# Patient Record
Sex: Female | Born: 1945
Health system: Southern US, Community
[De-identification: ages and names within clinical notes are randomized; demographics above are authoritative.]

## PROBLEM LIST (undated history)

## (undated) DIAGNOSIS — J302 Other seasonal allergic rhinitis: Secondary | ICD-10-CM

## (undated) DIAGNOSIS — F419 Anxiety disorder, unspecified: Secondary | ICD-10-CM

## (undated) DIAGNOSIS — Z8719 Personal history of other diseases of the digestive system: Secondary | ICD-10-CM

## (undated) DIAGNOSIS — N135 Crossing vessel and stricture of ureter without hydronephrosis: Secondary | ICD-10-CM

## (undated) DIAGNOSIS — N393 Stress incontinence (female) (male): Secondary | ICD-10-CM

## (undated) DIAGNOSIS — K219 Gastro-esophageal reflux disease without esophagitis: Secondary | ICD-10-CM

## (undated) DIAGNOSIS — Z973 Presence of spectacles and contact lenses: Secondary | ICD-10-CM

## (undated) DIAGNOSIS — F32A Depression, unspecified: Secondary | ICD-10-CM

## (undated) DIAGNOSIS — F329 Major depressive disorder, single episode, unspecified: Secondary | ICD-10-CM

## (undated) DIAGNOSIS — Z8669 Personal history of other diseases of the nervous system and sense organs: Secondary | ICD-10-CM

## (undated) DIAGNOSIS — K5909 Other constipation: Secondary | ICD-10-CM

## (undated) HISTORY — DX: Major depressive disorder, single episode, unspecified: F32.9

## (undated) HISTORY — DX: Gastro-esophageal reflux disease without esophagitis: K21.9

## (undated) HISTORY — DX: Depression, unspecified: F32.A

## (undated) HISTORY — DX: Personal history of other diseases of the nervous system and sense organs: Z86.69

## (undated) HISTORY — DX: Anxiety disorder, unspecified: F41.9

---

## 1969-10-23 HISTORY — PX: TUBAL LIGATION: SHX77

## 1974-10-23 HISTORY — PX: VAGINAL HYSTERECTOMY: SUR661

## 1978-10-23 HISTORY — PX: BREAST EXCISIONAL BIOPSY: SUR124

## 1978-10-23 HISTORY — PX: BREAST BIOPSY: SHX20

## 1998-11-22 ENCOUNTER — Other Ambulatory Visit: Admission: RE | Admit: 1998-11-22 | Discharge: 1998-11-22 | Payer: Self-pay | Admitting: Family Medicine

## 2000-09-12 ENCOUNTER — Ambulatory Visit (HOSPITAL_COMMUNITY): Admission: RE | Admit: 2000-09-12 | Discharge: 2000-09-12 | Payer: Self-pay | Admitting: Gastroenterology

## 2000-09-12 ENCOUNTER — Encounter: Payer: Self-pay | Admitting: Gastroenterology

## 2001-05-14 ENCOUNTER — Other Ambulatory Visit: Admission: RE | Admit: 2001-05-14 | Discharge: 2001-05-14 | Payer: Self-pay | Admitting: Gynecology

## 2002-01-06 ENCOUNTER — Encounter: Admission: RE | Admit: 2002-01-06 | Discharge: 2002-01-06 | Payer: Self-pay | Admitting: Family Medicine

## 2002-01-06 ENCOUNTER — Encounter: Payer: Self-pay | Admitting: Family Medicine

## 2002-03-06 ENCOUNTER — Encounter: Payer: Self-pay | Admitting: Emergency Medicine

## 2002-03-06 ENCOUNTER — Inpatient Hospital Stay (HOSPITAL_COMMUNITY): Admission: EM | Admit: 2002-03-06 | Discharge: 2002-03-07 | Payer: Self-pay | Admitting: Emergency Medicine

## 2002-04-01 ENCOUNTER — Ambulatory Visit (HOSPITAL_COMMUNITY): Admission: RE | Admit: 2002-04-01 | Discharge: 2002-04-01 | Payer: Self-pay | Admitting: Gastroenterology

## 2002-04-01 ENCOUNTER — Encounter: Payer: Self-pay | Admitting: Gastroenterology

## 2002-04-02 ENCOUNTER — Ambulatory Visit (HOSPITAL_COMMUNITY): Admission: RE | Admit: 2002-04-02 | Discharge: 2002-04-02 | Payer: Self-pay | Admitting: Gastroenterology

## 2002-04-02 HISTORY — PX: ESOPHAGOGASTRODUODENOSCOPY: SHX1529

## 2002-05-26 ENCOUNTER — Other Ambulatory Visit: Admission: RE | Admit: 2002-05-26 | Discharge: 2002-05-26 | Payer: Self-pay | Admitting: Gynecology

## 2003-05-02 ENCOUNTER — Encounter: Payer: Self-pay | Admitting: Emergency Medicine

## 2003-05-02 ENCOUNTER — Emergency Department (HOSPITAL_COMMUNITY): Admission: EM | Admit: 2003-05-02 | Discharge: 2003-05-02 | Payer: Self-pay | Admitting: Emergency Medicine

## 2003-06-22 ENCOUNTER — Other Ambulatory Visit: Admission: RE | Admit: 2003-06-22 | Discharge: 2003-06-22 | Payer: Self-pay | Admitting: Gynecology

## 2003-12-04 ENCOUNTER — Encounter: Admission: RE | Admit: 2003-12-04 | Discharge: 2003-12-04 | Payer: Self-pay | Admitting: Family Medicine

## 2004-03-10 ENCOUNTER — Inpatient Hospital Stay (HOSPITAL_COMMUNITY): Admission: RE | Admit: 2004-03-10 | Discharge: 2004-03-11 | Payer: Self-pay | Admitting: Neurosurgery

## 2004-03-10 HISTORY — PX: ANTERIOR CERVICAL DECOMP/DISCECTOMY FUSION: SHX1161

## 2004-05-04 ENCOUNTER — Encounter: Admission: RE | Admit: 2004-05-04 | Discharge: 2004-05-04 | Payer: Self-pay | Admitting: Neurosurgery

## 2004-06-22 ENCOUNTER — Other Ambulatory Visit: Admission: RE | Admit: 2004-06-22 | Discharge: 2004-06-22 | Payer: Self-pay | Admitting: Gynecology

## 2004-09-27 ENCOUNTER — Ambulatory Visit: Payer: Self-pay | Admitting: Family Medicine

## 2005-05-03 ENCOUNTER — Ambulatory Visit: Payer: Self-pay | Admitting: Family Medicine

## 2005-06-22 ENCOUNTER — Other Ambulatory Visit: Admission: RE | Admit: 2005-06-22 | Discharge: 2005-06-22 | Payer: Self-pay | Admitting: Gynecology

## 2005-07-14 ENCOUNTER — Encounter: Admission: RE | Admit: 2005-07-14 | Discharge: 2005-07-14 | Payer: Self-pay | Admitting: Family Medicine

## 2005-10-21 ENCOUNTER — Ambulatory Visit: Payer: Self-pay | Admitting: Family Medicine

## 2005-11-22 ENCOUNTER — Ambulatory Visit: Payer: Self-pay | Admitting: Family Medicine

## 2005-12-18 ENCOUNTER — Ambulatory Visit: Payer: Self-pay | Admitting: Family Medicine

## 2006-01-11 ENCOUNTER — Ambulatory Visit: Payer: Self-pay | Admitting: Family Medicine

## 2006-02-06 ENCOUNTER — Ambulatory Visit: Payer: Self-pay | Admitting: Family Medicine

## 2006-02-16 ENCOUNTER — Ambulatory Visit: Payer: Self-pay | Admitting: Cardiology

## 2006-06-10 ENCOUNTER — Ambulatory Visit (HOSPITAL_COMMUNITY): Admission: RE | Admit: 2006-06-10 | Discharge: 2006-06-10 | Payer: Self-pay | Admitting: Emergency Medicine

## 2006-06-26 ENCOUNTER — Other Ambulatory Visit: Admission: RE | Admit: 2006-06-26 | Discharge: 2006-06-26 | Payer: Self-pay | Admitting: Gynecology

## 2006-06-26 ENCOUNTER — Ambulatory Visit: Payer: Self-pay | Admitting: Family Medicine

## 2006-07-09 ENCOUNTER — Ambulatory Visit: Payer: Self-pay | Admitting: Internal Medicine

## 2006-08-28 ENCOUNTER — Encounter (INDEPENDENT_AMBULATORY_CARE_PROVIDER_SITE_OTHER): Payer: Self-pay | Admitting: *Deleted

## 2006-08-28 ENCOUNTER — Ambulatory Visit: Admission: RE | Admit: 2006-08-28 | Discharge: 2006-08-28 | Payer: Self-pay | Admitting: Internal Medicine

## 2006-08-28 ENCOUNTER — Ambulatory Visit: Payer: Self-pay | Admitting: Internal Medicine

## 2007-01-10 ENCOUNTER — Ambulatory Visit: Payer: Self-pay | Admitting: Family Medicine

## 2007-01-10 LAB — CONVERTED CEMR LAB
ALT: 15 units/L (ref 0–40)
AST: 29 units/L (ref 0–37)
Albumin: 4.2 g/dL (ref 3.5–5.2)
Alkaline Phosphatase: 39 units/L (ref 39–117)
BUN: 10 mg/dL (ref 6–23)
Basophils Absolute: 0.1 10*3/uL (ref 0.0–0.1)
Basophils Relative: 1.3 % — ABNORMAL HIGH (ref 0.0–1.0)
Bilirubin, Direct: 0.1 mg/dL (ref 0.0–0.3)
CO2: 29 meq/L (ref 19–32)
Calcium: 9.4 mg/dL (ref 8.4–10.5)
Chloride: 106 meq/L (ref 96–112)
Cholesterol: 214 mg/dL (ref 0–200)
Creatinine, Ser: 0.8 mg/dL (ref 0.4–1.2)
Direct LDL: 147.7 mg/dL
Eosinophils Absolute: 0.2 10*3/uL (ref 0.0–0.6)
Eosinophils Relative: 5 % (ref 0.0–5.0)
GFR calc Af Amer: 94 mL/min
GFR calc non Af Amer: 78 mL/min
Glucose, Bld: 84 mg/dL (ref 70–99)
HCT: 38 % (ref 36.0–46.0)
HDL: 61.1 mg/dL (ref >39.0)
Hemoglobin: 13.1 g/dL (ref 12.0–15.0)
Lymphocytes Relative: 34.1 % (ref 12.0–46.0)
MCHC: 34.5 g/dL (ref 30.0–36.0)
MCV: 84.4 fL (ref 78.0–100.0)
Monocytes Absolute: 0.4 10*3/uL (ref 0.2–0.7)
Monocytes Relative: 9.6 % (ref 3.0–11.0)
Neutro Abs: 2.1 10*3/uL (ref 1.4–7.7)
Neutrophils Relative %: 50 % (ref 43.0–77.0)
Platelets: 198 10*3/uL (ref 150–400)
Potassium: 4.7 meq/L (ref 3.5–5.1)
RBC: 4.5 M/uL (ref 3.87–5.11)
RDW: 13.1 % (ref 11.5–14.6)
Sodium: 142 meq/L (ref 135–145)
TSH: 1.62 microintl units/mL (ref 0.35–5.50)
Total Bilirubin: 0.7 mg/dL (ref 0.3–1.2)
Total CHOL/HDL Ratio: 3.5
Total Protein: 6.4 g/dL (ref 6.0–8.3)
Triglycerides: 51 mg/dL (ref 0–149)
VLDL: 10 mg/dL (ref 0–40)
WBC: 4.3 10*3/uL — ABNORMAL LOW (ref 4.5–10.5)

## 2007-01-24 ENCOUNTER — Ambulatory Visit: Payer: Self-pay | Admitting: Family Medicine

## 2007-03-19 ENCOUNTER — Ambulatory Visit: Payer: Self-pay | Admitting: Family Medicine

## 2007-06-07 ENCOUNTER — Encounter: Admission: RE | Admit: 2007-06-07 | Discharge: 2007-06-07 | Payer: Self-pay | Admitting: Family Medicine

## 2007-06-11 ENCOUNTER — Encounter: Admission: RE | Admit: 2007-06-11 | Discharge: 2007-06-11 | Payer: Self-pay | Admitting: Gynecology

## 2007-07-02 ENCOUNTER — Other Ambulatory Visit: Admission: RE | Admit: 2007-07-02 | Discharge: 2007-07-02 | Payer: Self-pay | Admitting: Gynecology

## 2007-07-16 ENCOUNTER — Telehealth: Payer: Self-pay | Admitting: Family Medicine

## 2007-09-13 ENCOUNTER — Encounter: Payer: Self-pay | Admitting: Family Medicine

## 2007-11-06 ENCOUNTER — Ambulatory Visit: Payer: Self-pay | Admitting: Family Medicine

## 2007-11-06 DIAGNOSIS — K219 Gastro-esophageal reflux disease without esophagitis: Secondary | ICD-10-CM | POA: Insufficient documentation

## 2007-11-06 DIAGNOSIS — K644 Residual hemorrhoidal skin tags: Secondary | ICD-10-CM | POA: Insufficient documentation

## 2007-11-06 DIAGNOSIS — F341 Dysthymic disorder: Secondary | ICD-10-CM | POA: Insufficient documentation

## 2007-11-06 DIAGNOSIS — K589 Irritable bowel syndrome without diarrhea: Secondary | ICD-10-CM | POA: Insufficient documentation

## 2007-11-06 DIAGNOSIS — M810 Age-related osteoporosis without current pathological fracture: Secondary | ICD-10-CM | POA: Insufficient documentation

## 2007-11-28 ENCOUNTER — Encounter: Payer: Self-pay | Admitting: Family Medicine

## 2007-11-29 ENCOUNTER — Encounter: Payer: Self-pay | Admitting: Family Medicine

## 2007-12-05 ENCOUNTER — Telehealth: Payer: Self-pay | Admitting: Family Medicine

## 2008-01-28 ENCOUNTER — Telehealth: Payer: Self-pay | Admitting: Family Medicine

## 2008-02-12 LAB — HM PAP SMEAR: HM Pap smear: NEGATIVE

## 2008-02-19 ENCOUNTER — Telehealth: Payer: Self-pay | Admitting: Family Medicine

## 2008-04-06 ENCOUNTER — Encounter: Payer: Self-pay | Admitting: Family Medicine

## 2008-04-08 ENCOUNTER — Encounter: Payer: Self-pay | Admitting: Family Medicine

## 2008-05-23 LAB — HM MAMMOGRAPHY: HM Mammogram: NORMAL

## 2008-07-01 ENCOUNTER — Other Ambulatory Visit: Admission: RE | Admit: 2008-07-01 | Discharge: 2008-07-01 | Payer: Self-pay | Admitting: Gynecology

## 2008-07-15 ENCOUNTER — Encounter: Admission: RE | Admit: 2008-07-15 | Discharge: 2008-07-15 | Payer: Self-pay | Admitting: Gynecology

## 2008-07-21 ENCOUNTER — Ambulatory Visit: Payer: Self-pay | Admitting: Family Medicine

## 2008-07-29 ENCOUNTER — Telehealth: Payer: Self-pay | Admitting: Family Medicine

## 2008-11-23 ENCOUNTER — Encounter: Admission: RE | Admit: 2008-11-23 | Discharge: 2008-11-23 | Payer: Self-pay | Admitting: Neurosurgery

## 2008-11-25 ENCOUNTER — Encounter: Payer: Self-pay | Admitting: Family Medicine

## 2008-12-17 ENCOUNTER — Telehealth: Payer: Self-pay | Admitting: Family Medicine

## 2009-01-13 ENCOUNTER — Ambulatory Visit: Payer: Self-pay | Admitting: Family Medicine

## 2009-01-14 ENCOUNTER — Encounter: Payer: Self-pay | Admitting: Family Medicine

## 2009-01-19 ENCOUNTER — Ambulatory Visit: Payer: Self-pay | Admitting: Family Medicine

## 2009-01-19 DIAGNOSIS — G43009 Migraine without aura, not intractable, without status migrainosus: Secondary | ICD-10-CM | POA: Insufficient documentation

## 2009-01-19 LAB — CONVERTED CEMR LAB
ALT: 20 units/L (ref 0–35)
AST: 31 units/L (ref 0–37)
Albumin: 4.1 g/dL (ref 3.5–5.2)
Alkaline Phosphatase: 41 units/L (ref 39–117)
BUN: 14 mg/dL (ref 6–23)
Basophils Absolute: 0 10*3/uL (ref 0.0–0.1)
Basophils Relative: 0.9 % (ref 0.0–3.0)
Bilirubin Urine: NEGATIVE
Bilirubin, Direct: 0.1 mg/dL (ref 0.0–0.3)
CO2: 31 meq/L (ref 19–32)
Calcium: 9.1 mg/dL (ref 8.4–10.5)
Chloride: 107 meq/L (ref 96–112)
Cholesterol: 200 mg/dL (ref 0–200)
Creatinine, Ser: 0.8 mg/dL (ref 0.4–1.2)
Eosinophils Absolute: 0.2 10*3/uL (ref 0.0–0.7)
Eosinophils Relative: 5.3 % — ABNORMAL HIGH (ref 0.0–5.0)
GFR calc non Af Amer: 76.92 mL/min (ref >60)
Glucose, Bld: 91 mg/dL (ref 70–99)
HCT: 40.3 % (ref 36.0–46.0)
HDL: 61 mg/dL (ref >39.00)
Hemoglobin, Urine: NEGATIVE
Hemoglobin: 13.9 g/dL (ref 12.0–15.0)
Ketones, ur: NEGATIVE mg/dL
LDL Cholesterol: 129 mg/dL — ABNORMAL HIGH (ref 0–99)
Leukocytes, UA: NEGATIVE
Lymphocytes Relative: 32 % (ref 12.0–46.0)
Lymphs Abs: 1.4 10*3/uL (ref 0.7–4.0)
MCHC: 34.4 g/dL (ref 30.0–36.0)
MCV: 86.5 fL (ref 78.0–100.0)
Monocytes Absolute: 0.5 10*3/uL (ref 0.1–1.0)
Monocytes Relative: 10.2 % (ref 3.0–12.0)
Neutro Abs: 2.4 10*3/uL (ref 1.4–7.7)
Neutrophils Relative %: 51.6 % (ref 43.0–77.0)
Nitrite: NEGATIVE
Platelets: 182 10*3/uL (ref 150.0–400.0)
Potassium: 4.2 meq/L (ref 3.5–5.1)
RBC: 4.66 M/uL (ref 3.87–5.11)
RDW: 12.7 % (ref 11.5–14.6)
Sodium: 143 meq/L (ref 135–145)
Specific Gravity, Urine: 1.02 (ref 1.000–1.030)
TSH: 1.53 microintl units/mL (ref 0.35–5.50)
Total Bilirubin: 0.8 mg/dL (ref 0.3–1.2)
Total CHOL/HDL Ratio: 3
Total Protein, Urine: NEGATIVE mg/dL
Total Protein: 6.5 g/dL (ref 6.0–8.3)
Triglycerides: 51 mg/dL (ref 0.0–149.0)
Urine Glucose: NEGATIVE mg/dL
Urobilinogen, UA: 0.2 (ref 0.0–1.0)
VLDL: 10.2 mg/dL (ref 0.0–40.0)
WBC: 4.5 10*3/uL (ref 4.5–10.5)
pH: 6 (ref 5.0–8.0)

## 2009-02-02 ENCOUNTER — Telehealth: Payer: Self-pay | Admitting: Family Medicine

## 2009-02-16 ENCOUNTER — Ambulatory Visit: Payer: Self-pay | Admitting: Family Medicine

## 2009-03-08 ENCOUNTER — Encounter: Payer: Self-pay | Admitting: Family Medicine

## 2009-03-08 HISTORY — PX: COLONOSCOPY WITH PROPOFOL: SHX5780

## 2009-05-20 ENCOUNTER — Telehealth (INDEPENDENT_AMBULATORY_CARE_PROVIDER_SITE_OTHER): Payer: Self-pay | Admitting: *Deleted

## 2009-06-15 ENCOUNTER — Telehealth: Payer: Self-pay | Admitting: Family Medicine

## 2009-07-14 ENCOUNTER — Telehealth: Payer: Self-pay | Admitting: Family Medicine

## 2009-09-07 ENCOUNTER — Telehealth: Payer: Self-pay | Admitting: Family Medicine

## 2009-10-12 ENCOUNTER — Ambulatory Visit: Payer: Self-pay | Admitting: Family Medicine

## 2009-10-26 ENCOUNTER — Encounter: Payer: Self-pay | Admitting: Family Medicine

## 2009-11-13 LAB — HM COLONOSCOPY: HM Colonoscopy: NORMAL

## 2010-01-18 ENCOUNTER — Ambulatory Visit: Payer: Self-pay | Admitting: Family Medicine

## 2010-01-25 ENCOUNTER — Ambulatory Visit: Payer: Self-pay | Admitting: Family Medicine

## 2010-01-25 LAB — CONVERTED CEMR LAB
ALT: 18 units/L (ref 0–35)
AST: 28 units/L (ref 0–37)
Albumin: 4.3 g/dL (ref 3.5–5.2)
Alkaline Phosphatase: 35 units/L — ABNORMAL LOW (ref 39–117)
BUN: 13 mg/dL (ref 6–23)
Basophils Absolute: 0.1 10*3/uL (ref 0.0–0.1)
Basophils Relative: 1.2 % (ref 0.0–3.0)
Bilirubin Urine: NEGATIVE
Bilirubin, Direct: 0 mg/dL (ref 0.0–0.3)
CO2: 33 meq/L — ABNORMAL HIGH (ref 19–32)
Calcium: 9.4 mg/dL (ref 8.4–10.5)
Chloride: 105 meq/L (ref 96–112)
Cholesterol: 230 mg/dL — ABNORMAL HIGH (ref 0–200)
Creatinine, Ser: 0.8 mg/dL (ref 0.4–1.2)
Direct LDL: 143.2 mg/dL
Eosinophils Absolute: 0.2 10*3/uL (ref 0.0–0.7)
Eosinophils Relative: 5 % (ref 0.0–5.0)
GFR calc non Af Amer: 76.67 mL/min (ref >60)
Glucose, Bld: 89 mg/dL (ref 70–99)
Glucose, Urine, Semiquant: NEGATIVE
HCT: 42.3 % (ref 36.0–46.0)
HDL: 65 mg/dL (ref >39.00)
Hemoglobin: 13.7 g/dL (ref 12.0–15.0)
Ketones, urine, test strip: NEGATIVE
Lymphocytes Relative: 33.8 % (ref 12.0–46.0)
Lymphs Abs: 1.7 10*3/uL (ref 0.7–4.0)
MCHC: 32.4 g/dL (ref 30.0–36.0)
MCV: 88.6 fL (ref 78.0–100.0)
Monocytes Absolute: 0.5 10*3/uL (ref 0.1–1.0)
Monocytes Relative: 10.5 % (ref 3.0–12.0)
Neutro Abs: 2.4 10*3/uL (ref 1.4–7.7)
Neutrophils Relative %: 49.5 % (ref 43.0–77.0)
Nitrite: NEGATIVE
Platelets: 224 10*3/uL (ref 150.0–400.0)
Potassium: 4.2 meq/L (ref 3.5–5.1)
Protein, U semiquant: NEGATIVE
RBC: 4.77 M/uL (ref 3.87–5.11)
RDW: 13.3 % (ref 11.5–14.6)
Sodium: 146 meq/L — ABNORMAL HIGH (ref 135–145)
Specific Gravity, Urine: 1.015
TSH: 1.76 microintl units/mL (ref 0.35–5.50)
Total Bilirubin: 0.3 mg/dL (ref 0.3–1.2)
Total CHOL/HDL Ratio: 4
Total Protein: 6.8 g/dL (ref 6.0–8.3)
Triglycerides: 144 mg/dL (ref 0.0–149.0)
Urobilinogen, UA: 0.2
VLDL: 28.8 mg/dL (ref 0.0–40.0)
WBC Urine, dipstick: NEGATIVE
WBC: 4.9 10*3/uL (ref 4.5–10.5)
pH: 7

## 2010-02-02 LAB — CONVERTED CEMR LAB: Vit D, 25-Hydroxy: 24 ng/mL — ABNORMAL LOW (ref 30–89)

## 2010-02-09 ENCOUNTER — Telehealth: Payer: Self-pay | Admitting: Family Medicine

## 2010-07-27 ENCOUNTER — Telehealth: Payer: Self-pay | Admitting: Family Medicine

## 2010-11-13 ENCOUNTER — Encounter: Payer: Self-pay | Admitting: Family Medicine

## 2010-11-13 ENCOUNTER — Encounter: Payer: Self-pay | Admitting: Neurosurgery

## 2010-11-14 ENCOUNTER — Encounter: Payer: Self-pay | Admitting: Gynecology

## 2010-11-22 NOTE — Progress Notes (Signed)
Summary: RX REFILL  zomig   Phone Note Call from Patient Call back at [phone]   Caller: PT LIVE Call For: STAFFORD Summary of Call: PATIENT NEEDS RX REFILL FOR SUMATRIPTAN 50MG  TABS,  [facility]  [id number], FAX [phone]. Initial call taken by: Celine Ahr,  February 02, 2009 8:36 AM    New/Updated Medications: ZOMIG 5 MG TABS (ZOLMITRIPTAN) 1 by mouth at onset headache repeat in 2 hrs if needed.   Prescriptions: ZOMIG 5 MG TABS (ZOLMITRIPTAN) 1 by mouth at onset headache repeat in 2 hrs if needed.  #9 x 11   Entered by:   Pura Spice, RN   Authorized by:   Judithann Sheen MD   Signed by:   Pura Spice, RN on 02/02/2009   Method used:   Electronically to        [facility]* (retail)       [address]       [place], [place]  [address]       Ph: [phone]       Fax: [phone]   RxID:   [id number]

## 2010-11-22 NOTE — Assessment & Plan Note (Signed)
Summary: congestion//ccm   Vital Signs:  Patient profile:   65 year old female Weight:      130 pounds BMI:     22.05 O2 Sat:      98 % Temp:     98 degrees F Pulse rate:   99 / minute BP sitting:   110 / 72  (left arm)  Vitals Entered By: Pura Spice, RN (October 12, 2009 11:05 AM) CC: chest congestion yellow sputum ears full sinus pressure and pain rt chest area    History of Present Illness: this 65 year old white female comes in today complaining of a two-week history of chest congestion nasal congestion as well as pain over the face over the maxillary sinus areas as well as the sensation of the ears being full., in the beginning and was seen by urticaria and fell the patient of all stomatitis and treat her with Magic mouth wash  She also complains of pain over the rib cage on deep inspiration and movement Gen. mildly making Blood pressure control GERD under control with omeprazole 40 mg q. day Headaches controlled with Comtrex  Allergies (verified): No Known Drug Allergies  Review of Systems  The patient denies anorexia, fever, weight loss, weight gain, vision loss, decreased hearing, hoarseness, chest pain, syncope, dyspnea on exertion, peripheral edema, prolonged cough, headaches, hemoptysis, abdominal pain, melena, hematochezia, severe indigestion/heartburn, hematuria, incontinence, genital sores, muscle weakness, suspicious skin lesions, transient blindness, difficulty walking, depression, unusual weight change, abnormal bleeding, enlarged lymph nodes, angioedema, breast masses, and testicular masses.    Physical Exam  General:  Well-developed,well-nourished,in no acute distress; alert,appropriate and cooperative throughout examination Head:  Normocephalic and atraumatic without obvious abnormalities. No apparent alopecia or balding. Eyes:  No corneal or conjunctival inflammation noted. EOMI. Perrla. Funduscopic exam benign, without hemorrhages, exudates or  papilledema. Vision grossly normal. Ears:  External ear exam shows no significant lesions or deformities.  Otoscopic examination reveals clear canals, tympanic membranes are intact bilaterally without bulging, retraction, inflammation or discharge. Hearing is grossly normal bilaterally. Nose:  considerable nasal congestion with erythematous mucosa and discolored nasal drainage tenderness over the maxillary sinuses bilaterally Mouth:  Oral mucosa and oropharynx without lesions or exudates.  Teeth in good repair. Neck:  No deformities, masses, or tenderness noted. Chest Wall:  tenderness right costochondral joints ribs 2-7 Lungs:  rhonchi bilaterally with rales at both bases, no wheezing Heart:  Normal rate and regular rhythm. S1 and S2 normal without gallop, murmur, click, rub or other extra sounds. Abdomen:  Bowel sounds positive,abdomen soft and non-tender without masses, organomegaly or hernias noted. Extremities:  No clubbing, cyanosis, edema, or deformity noted with normal full range of motion of all joints.     Impression & Recommendations:  Problem # 1:  ACUTE MAXILLARY SINUSITIS (ICD-461.0) Assessment New  Her updated medication list for this problem includes:    Fluticasone Propionate 50 Mcg/act Susp (Fluticasone propionate) .Marland Kitchen... 2 sprays each nostril once daily    Zithromax Z-pak 250 Mg Tabs (Azithromycin) .Marland Kitchen... 2 now then 1 once daily until finished    Mucinex D [phone] Mg Xr12h-tab (Pseudoephedrine-guaifenesin) .Marland Kitchen... 1 bid    Hydrocodone-homatropine 5-1.5 Mg/86ml Syrp (Hydrocodone-homatropine) .Marland Kitchen... 1-2 tsp q4h as needed cough  Orders: Prescription Created Electronically [phone])  Problem # 2:  ACUTE BRONCHITIS (ICD-466.0) Assessment: New  Her updated medication list for this problem includes:    Zithromax Z-pak 250 Mg Tabs (Azithromycin) .Marland Kitchen... 2 now then 1 once daily until finished    Mucinex D [phone]  Mg Xr12h-tab (Pseudoephedrine-guaifenesin) .Marland Kitchen... 1 bid     Hydrocodone-homatropine 5-1.5 Mg/59ml Syrp (Hydrocodone-homatropine) .Marland Kitchen... 1-2 tsp q4h as needed cough  Problem # 3:  COSTOCHONDRITIS (ICD-733.6) Assessment: New  Her updated medication list for this problem includes:    Actonel 150 Mg Tabs (Risedronate sodium) .Marland Kitchen... Take 1 by mouth monthly  Orders: Depo- Medrol 80mg  (J1040) Depo- Medrol 40mg  (J1030) Admin of Therapeutic Inj  intramuscular or subcutaneous ([address])  Problem # 4:  ANXIETY DEPRESSION (ICD-300.4) Assessment: Improved  Problem # 5:  GERD (ICD-530.81) Assessment: Improved  Her updated medication list for this problem includes:    Omeprazole 40 Mg Cpdr (Omeprazole) .Marland Kitchen... 1 by mouth once daily  Complete Medication List: 1)  Citalopram Hydrobromide 40 Mg Tabs (Citalopram hydrobromide) .Marland Kitchen.. 1 by mouth once daily 2)  Analpram Hc  .... Insert and apply bid 3)  Fluticasone Propionate 50 Mcg/act Susp (Fluticasone propionate) .... 2 sprays each nostril once daily 4)  Fexofenadine Hcl 180 Mg Tabs (Fexofenadine hcl) .... Take 1 by mouth once daily 5)  Diazepam 5 Mg Tabs (Diazepam) .Marland Kitchen.. 1 by mouth three times a day as needed stress 6)  Clotrimazole-betamethasone 1-0.05 % Crea (Clotrimazole-betamethasone) .... Use sparingly to rash on hand 7)  Actonel 150 Mg Tabs (Risedronate sodium) .... Take 1 by mouth monthly 8)  Imitrex 100 Mg Tabs (Sumatriptan succinate) .Marland Kitchen.. 1 by mouth at the onset of migraine. may repeat dose in one hour if headache not resolved 9)  Omeprazole 40 Mg Cpdr (Omeprazole) .Marland Kitchen.. 1 by mouth once daily 10)  Zithromax Z-pak 250 Mg Tabs (Azithromycin) .... 2 now then 1 once daily until finished 11)  Mucinex D [phone] Mg Xr12h-tab (Pseudoephedrine-guaifenesin) .Marland Kitchen.. 1 bid 12)  Hydrocodone-homatropine 5-1.5 Mg/45ml Syrp (Hydrocodone-homatropine) .Marland Kitchen.. 1-2 tsp q4h as needed cough 13)  Ibuprofen 600 Mg Tabs (Ibuprofen) .Marland Kitchen.. 1 tid  Patient Instructions: 1)  Sinusitis, bronchitis and costochondritis 2)  Zpack for infection 3)   Mucinex D maximum strength two times a day 4)  for chest and sinusitis 5)  hydromet cough  2 tsp q4h as needed for cough 6)  Depomedrol IM for costochondritis also ibuprofen 800 g three times a day 7)  Keep well hydrated, good fluid intake 8)  if not completely well, do not hesitate to call Prescriptions: HYDROCODONE-HOMATROPINE 5-1.5 MG/5ML SYRP (HYDROCODONE-HOMATROPINE) 1-2 tsp q4h as needed cough  #240 cc x 2   Entered and Authorized by:   Judithann Sheen MD   Signed by:   Judithann Sheen MD on 10/12/2009   Method used:   Print then Mail to Patient   RxID:   [id number] ZITHROMAX Z-PAK 250 MG TABS (AZITHROMYCIN) 2 now then 1 once daily until finished  #1 pkge x 0   Entered and Authorized by:   Judithann Sheen MD   Signed by:   Judithann Sheen MD on 10/12/2009   Method used:   Electronically to        [facility]. [address] [phone]* (retail)       [address]       [place], [place]  [address]       Ph: [phone] or [phone]       Fax: [phone]   RxID:   [id number]    Medication Administration  Injection # 1:    Medication: Depo- Medrol 80mg     Diagnosis: COSTOCHONDRITIS (ICD-733.6)    Route: IM  Site: RUOQ gluteus    Exp Date: 07/2010    Lot #: [id number]    Mfr: teva    Patient tolerated injection without complications    Given by: Pura Spice, RN (October 12, 2009 1:22 PM)  Injection # 2:    Medication: Depo- Medrol 40mg     Diagnosis: COSTOCHONDRITIS (ICD-733.6)    Route: IM    Site: RUOQ gluteus    Exp Date: 07/2010    Lot #: [id number]    Mfr: teva    Patient tolerated injection without complications    Given by: Pura Spice, RN (October 12, 2009 1:23 PM)  Orders Added: 1)  Depo- Medrol 80mg  [J1040] 2)  Depo- Medrol 40mg  [J1030] 3)  Admin of Therapeutic Inj  intramuscular or subcutaneous [96372] 4)  Prescription Created Electronically [G8553] 5)  Est. Patient Level IV [61950]

## 2010-11-22 NOTE — Progress Notes (Signed)
Summary: import meds  Medications Added CITALOPRAM HYDROBROMIDE 40 MG TABS (CITALOPRAM HYDROBROMIDE)  PROTONIX 40 MG TBEC (PANTOPRAZOLE SODIUM)             New/Updated Medications: CITALOPRAM HYDROBROMIDE 40 MG TABS (CITALOPRAM HYDROBROMIDE)  PROTONIX 40 MG TBEC (PANTOPRAZOLE SODIUM)

## 2010-11-22 NOTE — Progress Notes (Signed)
Summary: [facility] called and said pt doesnt take Omeprazole   Phone Note From Pharmacy Call back at [phone] Tawanna Cooler   Caller: [facility] Tawanna Cooler Summary of Call: Omeprazole didnt work for pt and Dr. Scotty Court put pt on Protonix.  Pt was taken off the Omeprazole in Aug 2010.    Initial call taken by: Lucy Antigua,  February 09, 2010 9:36 AM  Follow-up for Phone Call        he took her off protonix due to her insurance  does her insurance cover it now she needs to see what is on her formulary  Follow-up by: Pura Spice, RN,  February 09, 2010 9:41 AM  Additional Follow-up for Phone Call Additional follow up Details #1::        spoke to todd he said generic is covered and rx called in.  Additional Follow-up by: Pura Spice, RN,  February 10, 2010 9:42 AM    New/Updated Medications: PROTONIX 40 MG TBEC (PANTOPRAZOLE SODIUM) 1 by mouth once daily Prescriptions: PROTONIX 40 MG TBEC (PANTOPRAZOLE SODIUM) 1 by mouth once daily  #30 x 11   Entered by:   Pura Spice, RN   Authorized by:   Judithann Sheen MD   Signed by:   Pura Spice, RN on 02/10/2010   Method used:   Electronically to        [facility]* (retail)       [address]       [place], [place]  [address]       Ph: [phone]       Fax: [phone]   RxID:   [id number]

## 2010-11-22 NOTE — Progress Notes (Signed)
Summary: med refill  Phone Note Call from Patient Call back at Home Phone [phone] Call back at [phone]   Caller: Patient Call For: Judithann Sheen MD Summary of Call: pt needs refill of sumatriptan 50 mg call into [facility] [phone] Initial call taken by: Heron Sabins,  July 14, 2009 3:39 PM  Follow-up for Phone Call        per Dr Scotty Court, imitrex 100mg  1 by mouth once daily #9 with 11 refills, rx called into pharmacy, insurance will no longer cover zomig Follow-up by: Alfred Levins, CMA,  July 14, 2009 4:17 PM    New/Updated Medications: IMITREX 100 MG TABS (SUMATRIPTAN SUCCINATE) 1 by mouth at the onset of migraine. May repeat dose in one hour if headache not resolved Prescriptions: IMITREX 100 MG TABS (SUMATRIPTAN SUCCINATE) 1 by mouth at the onset of migraine. May repeat dose in one hour if headache not resolved  #9 x 11   Entered by:   Alfred Levins, CMA   Authorized by:   Judithann Sheen MD   Signed by:   Alfred Levins, CMA on 07/14/2009   Method used:   Telephoned to ...       [facility]* (retail)       [address]       [place], [place]  [address]       Ph: [phone]       Fax: [phone]   RxID:   [id number]

## 2010-11-22 NOTE — Letter (Signed)
Summary: [facility]  [facility]   Imported By: Maryln Gottron 01/27/2009 12:59:33  _____________________________________________________________________  External Attachment:    Type:   Image     Comment:   External Document

## 2010-11-22 NOTE — Progress Notes (Signed)
Summary: Actonel  Phone Note Call from Patient   Caller: Patient Call For: Dr. Scotty Court Summary of Call: Pt would like to have Actonel called in to her pharmacy in [place]. [phone] -pharmacy number. Initial call taken by: Lynann Beaver CMA,  January 28, 2008 1:31 PM  Follow-up for Phone Call        please call her and tell her dr Scotty Court took care of this in Jan and she has 1 yr refill --she just needs to call her pharmacy  Follow-up by: Pura Spice, RN,  January 28, 2008 1:44 PM  Additional Follow-up for Phone Call Additional follow up Details #1::        LM for pt to call her pharmacy. Additional Follow-up by: Lynann Beaver CMA,  January 28, 2008 1:47 PM

## 2010-11-22 NOTE — Assessment & Plan Note (Signed)
Summary: gi issues/ccm  Medications Added * ANALPRAM HC Insert and apply bid ACTONEL WITH CALCIUM 35-1250 MG  TABS (RISEDRONATE-CALCIUM CARBONATE) 1 tab monthly as instructed      Allergies Added: NKDA  Vital Signs:  Patient Profile:   65 Years Old Female Weight:      131 pounds Temp:     98.1 degrees F Pulse rate:   88 / minute BP sitting:   106 / 70  (left arm) Cuff size:   regular  Vitals Entered By: Wendie Simmer                 Chief Complaint:  talk about osteopenia --dr Chevis Pretty wanted her to do IV meds and also hemorrhoids -wants rx. sees dr Loreta Ave for this problem .  History of Present Illness: Pt has osteopenia and discussed starting monthly actonel complains of chronic problem with proctitis and or hemorrhoids Dr. Loreta Ave dx pt with IBS constipation tx with amitiza once or twice daily colonoscopic up to date gerd under control with protonix no other problems sees gyn, Dr. Lorenso Courier, has had hysterectomy  Current Allergies (reviewed today): No known allergies      Review of Systems      See HPI   Physical Exam  General:     Well-developed,well-nourished,in no acute distress; alert,appropriate and cooperative throughout examination Lungs:     Normal respiratory effort, chest expands symmetrically. Lungs are clear to auscultation, no crackles or wheezes. Heart:     Normal rate and regular rhythm. S1 and S2 normal without gallop, murmur, click, rub or other extra sounds. Abdomen:     Bowel sounds positive,abdomen soft and non-tender without masses, organomegaly or hernias noted. Rectal:     small hemorrhoids external    Complete Medication List: 1)  Citalopram Hydrobromide 40 Mg Tabs (Citalopram hydrobromide) 2)  Protonix 40 Mg Tbec (Pantoprazole sodium) 3)  Analpram Hc  .... Insert and apply bid 4)  Actonel With Calcium 35-1250 Mg Tabs (Risedronate-calcium carbonate) .Marland Kitchen.. 1 tab monthly as instructed   Patient Instructions: 1)  to take actonel  monthly 2)  continue other rx    Prescriptions: ACTONEL WITH CALCIUM 35-1250 MG  TABS (RISEDRONATE-CALCIUM CARBONATE) 1 tab monthly as instructed  #1 x 1 yr   Entered and Authorized by:   Judithann Sheen MD   Signed by:   Judithann Sheen MD on 11/06/2007   Method used:   Print then Give to Patient   RxID:   [id number] ANALPRAM HC Insert and apply bid  #45 gms x 1 yr   Entered and Authorized by:   Judithann Sheen MD   Signed by:   Judithann Sheen MD on 11/06/2007   Method used:   Print then Give to Patient   RxID:   [id number]  ]

## 2010-11-22 NOTE — Progress Notes (Signed)
Summary: RX  flonase  Medications Added FLUTICASONE PROPIONATE 50 MCG/ACT  SUSP (FLUTICASONE PROPIONATE) 2 sprays each nostril once daily       Phone Note Call from Patient Call back at [phone]   Caller: PATIENT LIVE Call For: STAFFORD  Summary of Call: sHE WOULD LIKE AN RX CALLED IN FOR FLONASE.  [facility] [phone] Initial call taken by: Roselle Locus,  December 05, 2007 11:09 AM    New/Updated Medications: FLUTICASONE PROPIONATE 50 MCG/ACT  SUSP (FLUTICASONE PROPIONATE) 2 sprays each nostril once daily   Prescriptions: FLUTICASONE PROPIONATE 50 MCG/ACT  SUSP (FLUTICASONE PROPIONATE) 2 sprays each nostril once daily  #1 x 11   Entered by:   Pura Spice, RN   Authorized by:   Judithann Sheen MD   Signed by:   Pura Spice, RN on 12/05/2007   Method used:   Electronically sent to ...       [facility]       [address]       [place], [place]  [address]       Ph: [phone]       Fax: [phone]   RxID:   [id number]

## 2010-11-22 NOTE — Assessment & Plan Note (Signed)
Summary: shingles shot/jls   Nurse Visit     Allergies: No Known Drug Allergies    Immunizations Administered:  Zostavax # 1:    Vaccine Type: Zostavax    Site: right deltoid    Mfr: Merck    Dose: 0.5 ml    Given by: Pura Spice, RN    Exp. Date: 10/31/2009    Lot #: [id number]   Orders Added: 1)  Zoster (Shingles) Vaccine Live [90736] 2)  Admin 1st Vaccine [44010]

## 2010-11-22 NOTE — Letter (Signed)
Summary: [facility]  [facility]   Imported By: Maryln Gottron 12/22/2008 15:44:15  _____________________________________________________________________  External Attachment:    Type:   Image     Comment:   External Document

## 2010-11-22 NOTE — Progress Notes (Signed)
Summary: rx valium rx x 5   Phone Note From Pharmacy   Caller: [facility] Reason for Call: Needs renewal Summary of Call: refill diazepam 5 mg  Initial call taken by: Pura Spice, RN,  September 07, 2009 1:13 PM  Follow-up for Phone Call        ok w 5 refills per dr Scotty Court  Follow-up by: Pura Spice, RN,  September 07, 2009 1:13 PM    New/Updated Medications: DIAZEPAM 5 MG TABS (DIAZEPAM) 1 by mouth three times a day as needed stress Prescriptions: DIAZEPAM 5 MG TABS (DIAZEPAM) 1 by mouth three times a day as needed stress  #90 x 5   Entered by:   Pura Spice, RN   Authorized by:   Judithann Sheen MD   Signed by:   Pura Spice, RN on 09/07/2009   Method used:   Telephoned to ...       [facility]* (retail)       [address]       [place], [place]  [address]       Ph: [phone]       Fax: [phone]   RxID:   [id number]

## 2010-11-22 NOTE — Consult Note (Signed)
Summary: [facility]  [facility]   Imported By: Kassie Mends 04/14/2008 15:16:47  _____________________________________________________________________  External Attachment:    Type:   Image     Comment:   [facility] note

## 2010-11-22 NOTE — Progress Notes (Signed)
Summary: pt req script for Omeprazole  Phone Note Call from Patient Call back at [phone] work direct line   Caller: Patient Call For: Judithann Sheen MD Summary of Call: Patient called and said that she needs a new script for Omeprazole. Please call in script to [facility] [phone] Initial call taken by: Lucy Antigua,  June 15, 2009 2:14 PM  Follow-up for Phone Call        ok per dr Scotty Court to give omperazole 40mg   Follow-up by: Pura Spice, RN,  June 15, 2009 5:16 PM    New/Updated Medications: OMEPRAZOLE 40 MG CPDR (OMEPRAZOLE) 1 by mouth once daily Prescriptions: OMEPRAZOLE 40 MG CPDR (OMEPRAZOLE) 1 by mouth once daily  #30 x 11   Entered by:   Pura Spice, RN   Authorized by:   Judithann Sheen MD   Signed by:   Pura Spice, RN on 06/15/2009   Method used:   Electronically to        [facility]* (retail)       [address]       [place], [place]  [address]       Ph: [phone]       Fax: [phone]   RxID:   [id number]   Appended Document: pt req script for Omeprazole mess left that med called in .gh rn....Marland KitchenMarland Kitchen

## 2010-11-22 NOTE — Assessment & Plan Note (Signed)
Summary: cpx/mhf   Vital Signs:  Patient profile:   65 year old female Height:      64.5 inches Weight:      127 pounds BMI:     21.54 O2 Sat:      98 % Temp:     98.1 degrees F Pulse rate:   95 / minute BP sitting:   118 / 74  (left arm) Cuff size:   regular  Vitals Entered By: Pura Spice, RN (January 19, 2009 10:45 AM)  History of Present Illness: Pt in for cpx, 65 yr old white married female Last wek had Arna Medici virus, chills and fever, diarrhea Migraine headaches tx Imetrex godd results, 1 about every 3 months Neck pain and had MRI, Dr. Newell Coral, no change Gyn Dr. Chevis Pretty Dr. Pearlie Oyster  will do colonoscopic in April 2010 no other complaints EKG OK, routine  Allergies (verified): No Known Drug Allergies  Review of Systems      See HPI General:  Denies chills, fatigue, fever, loss of appetite, malaise, sleep disorder, sweats, weakness, and weight loss. Eyes:  Denies blurring, discharge, double vision, eye irritation, eye pain, halos, itching, light sensitivity, red eye, vision loss-1 eye, and vision loss-both eyes. ENT:  Denies decreased hearing, difficulty swallowing, ear discharge, earache, hoarseness, nasal congestion, nosebleeds, postnasal drainage, ringing in ears, sinus pressure, and sore throat. CV:  Denies bluish discoloration of lips or nails, chest pain or discomfort, difficulty breathing at night, difficulty breathing while lying down, fainting, fatigue, leg cramps with exertion, lightheadness, near fainting, palpitations, shortness of breath with exertion, swelling of feet, swelling of hands, and weight gain. Resp:  Denies chest discomfort, chest pain with inspiration, cough, coughing up blood, excessive snoring, hypersomnolence, morning headaches, pleuritic, shortness of breath, sputum productive, and wheezing. GI:  Denies abdominal pain, bloody stools, change in bowel habits, constipation, dark tarry stools, diarrhea, excessive appetite, gas, hemorrhoids, indigestion,  loss of appetite, nausea, vomiting, vomiting blood, and yellowish skin color; Arna Medici virus  last week, OK now. GU:  Denies abnormal vaginal bleeding, decreased libido, discharge, dysuria, genital sores, hematuria, incontinence, nocturia, urinary frequency, and urinary hesitancy. MS:  Complains of joint pain. Derm:  Denies changes in color of skin, changes in nail beds, dryness, excessive perspiration, flushing, hair loss, insect bite(s), itching, lesion(s), poor wound healing, and rash. Neuro:  See HPI; Complains of headaches. Psych:  Denies alternate hallucination ( auditory/visual), anxiety, depression, easily angered, easily tearful, irritability, mental problems, panic attacks, sense of great danger, suicidal thoughts/plans, thoughts of violence, unusual visions or sounds, and thoughts /plans of harming others.  Physical Exam  General:  Well-developed,well-nourished,in no acute distress; alert,appropriate and cooperative throughout examination Head:  Normocephalic and atraumatic without obvious abnormalities. No apparent alopecia or balding. Eyes:  No corneal or conjunctival inflammation noted. EOMI. Perrla. Funduscopic exam benign, without hemorrhages, exudates or papilledema. Vision grossly normal. Ears:  External ear exam shows no significant lesions or deformities.  Otoscopic examination reveals clear canals, tympanic membranes are intact bilaterally without bulging, retraction, inflammation or discharge. Hearing is grossly normal bilaterally. Nose:  External nasal examination shows no deformity or inflammation. Nasal mucosa are pink and moist without lesions or exudates. Mouth:  Oral mucosa and oropharynx without lesions or exudates.  Teeth in good repair. Neck:  No deformities, masses, or tenderness noted. Chest Wall:  No deformities, masses, or tenderness noted. Breasts:  No mass, nodules, thickening, tenderness, bulging, retraction, inflamation, nipple discharge or skin changes noted.     Lungs:  Normal respiratory effort, chest expands symmetrically. Lungs are clear to auscultation, no crackles or wheezes. Heart:  Normal rate and regular rhythm. S1 and S2 normal without gallop, murmur, click, rub or other extra sounds. Abdomen:  Bowel sounds positive,abdomen soft and non-tender without masses, organomegaly or hernias noted. Rectal:  NE Genitalia:  Dr. Chevis Pretty Msk:  No deformity or scoliosis noted of thoracic or lumbar spine.   Pulses:  R and L carotid,radial,femoral,dorsalis pedis and posterior tibial pulses are full and equal bilaterally Extremities:  No clubbing, cyanosis, edema, or deformity noted with normal full range of motion of all joints.   Neurologic:  No cranial nerve deficits noted. Station and gait are normal. Plantar reflexes are down-going bilaterally. DTRs are symmetrical throughout. Sensory, motor and coordinative functions appear intact. Skin:  Intact without suspicious lesions or rashes Cervical Nodes:  No lymphadenopathy noted Axillary Nodes:  No palpable lymphadenopathy Inguinal Nodes:  No significant adenopathy Psych:  Cognition and judgment appear intact. Alert and cooperative with normal attention span and concentration. No apparent delusions, illusions, hallucinations   Impression & Recommendations:  Problem # 1:  WELL ADULT EXAM (ICD-V70.0) Assessment New  Orders: EKG w/ Interpretation (93000)  Problem # 2:  COMMON MIGRAINE (ICD-346.10) Assessment: Improved  Orders: Prescription Created Electronically [phone])  Problem # 3:  ANXIETY DEPRESSION (ICD-300.4) Assessment: Improved  Problem # 4:  GERD (ICD-530.81) Assessment: Improved  Her updated medication list for this problem includes:    Protonix 40 Mg Tbec (Pantoprazole sodium) ..... One tab daily  Problem # 5:  OSTEOPENIA (ICD-733.90) Assessment: Unchanged  The following medications were removed from the medication list:    Actonel With Calcium 35-1250 Mg Tabs (Risedronate-calcium  carbonate) .Marland Kitchen... 1 tab monthly as instructed Her updated medication list for this problem includes:    Actonel 150 Mg Tabs (Risedronate sodium) .Marland Kitchen... Take 1 by mouth monthly  Complete Medication List: 1)  Citalopram Hydrobromide 40 Mg Tabs (Citalopram hydrobromide) .Marland Kitchen.. 1 by mouth once daily 2)  Analpram Hc  .... Insert and apply bid 3)  Fluticasone Propionate 50 Mcg/act Susp (Fluticasone propionate) .... 2 sprays each nostril once daily 4)  Fexofenadine Hcl 180 Mg Tabs (Fexofenadine hcl) .... Take 1 by mouth once daily 5)  Diazepam 5 Mg Tabs (Diazepam) .Marland Kitchen.. 1 by mouth three times a day as needed stress 6)  Protonix 40 Mg Tbec (Pantoprazole sodium) .... One tab daily 7)  Clotrimazole-betamethasone 1-0.05 % Crea (Clotrimazole-betamethasone) .... Use sparingly to rash on hand 8)  Actonel 150 Mg Tabs (Risedronate sodium) .... Take 1 by mouth monthly  Other Orders: Tdap => 24yrs IM (60454) Admin 1st Vaccine (09811)  Patient Instructions: 1)  Normal physical examinatio 2)  take medications as prescribed 3)  refilled medications Prescriptions: ACTONEL 150 MG TABS (RISEDRONATE SODIUM) take 1 by mouth monthly  #1 x 11   Entered and Authorized by:   Judithann Sheen MD   Signed by:   Judithann Sheen MD on 01/27/2009   Method used:   Telephoned to ...       [facility]* (retail)       [address]       [place], [place]  [address]       Ph: [phone]       Fax: [phone]   RxID:   [id number] DIAZEPAM 5 MG TABS (DIAZEPAM) 1 by mouth three times a day as needed stress  #90 x 5   Entered and Authorized by:   Valarie Merino  Montez Hageman MD   Signed by:   Judithann Sheen MD on 01/19/2009   Method used:   Print then Give to Patient   RxID:   [id number] PROTONIX 40 MG TBEC (PANTOPRAZOLE SODIUM) one tab daily  #30 x 11   Entered and Authorized by:   Judithann Sheen MD   Signed by:   Judithann Sheen MD on 01/19/2009   Method used:   Electronically to         [facility]* (retail)       [address]       [place], [place]  [address]       Ph: [phone]       Fax: [phone]   RxID:   [id number] FEXOFENADINE HCL 180 MG  TABS (FEXOFENADINE HCL) take 1 by mouth once daily  #30 x 11   Entered and Authorized by:   Judithann Sheen MD   Signed by:   Judithann Sheen MD on 01/19/2009   Method used:   Electronically to        [facility]* (retail)       [address]       [place], [place]  [address]       Ph: [phone]       Fax: [phone]   RxID:   [id number] FLUTICASONE PROPIONATE 50 MCG/ACT  SUSP (FLUTICASONE PROPIONATE) 2 sprays each nostril once daily  #1 x 11   Entered and Authorized by:   Judithann Sheen MD   Signed by:   Judithann Sheen MD on 01/19/2009   Method used:   Electronically to        [facility]* (retail)       [address]       [place], [place]  [address]       Ph: [phone]       Fax: [phone]   RxID:   [id number] ACTONEL WITH CALCIUM 35-1250 MG  TABS (RISEDRONATE-CALCIUM CARBONATE) 1 tab monthly as instructed  #1 x 11   Entered and Authorized by:   Judithann Sheen MD   Signed by:   Judithann Sheen MD on 01/19/2009   Method used:   Electronically to        [facility]* (retail)       [address]       [place], [place]  [address]       Ph: [phone]       Fax: [phone]   RxID:   [id number] CITALOPRAM HYDROBROMIDE 40 MG TABS (CITALOPRAM HYDROBROMIDE) 1 by mouth once daily  #30 x 11   Entered and Authorized by:   Judithann Sheen MD   Signed by:   Judithann Sheen MD on 01/19/2009   Method used:   Print then Give to Patient   RxID:   [id number]       Preventive Care Screening  Bone Density:    Date:  05/23/2008    Results:  abnormal std dev  Mammogram:    Date:  05/23/2008    Results:  normal      GYN  DR [facility]  colonscopy Dr [address]  to be sch 01/2009 p[er pt    Immunizations  Administered:  Tetanus Vaccine:    Vaccine Type: Tdap    Site: left deltoid    Mfr: GlaxoSmithKline    Dose: 0.5 ml    Route: IM    Given  by: Pura Spice, RN    Exp. Date: 12/16/2010    Lot #: [id number]

## 2010-11-22 NOTE — Progress Notes (Signed)
Summary: call reg abnl bone desity --  Phone Note Call from Patient   Caller: Patient Call For: stafford Summary of Call: wants to be called about IV 's therapy regarding her bone dcensity. has questions. p/s call [phone] Initial call taken by: Pura Spice, RN,  July 16, 2007 10:45 AM  Follow-up for Phone Call        CALLED BY DR STAFFORD Follow-up by: Judithann Sheen MD,  July 18, 2007 6:12 PM

## 2010-11-22 NOTE — Assessment & Plan Note (Signed)
Summary: CPX // RS   Vital Signs:  Patient profile:   65 year old female Height:      65.5 inches Weight:      130 pounds O2 Sat:      99 % Temp:     98.5 degrees F Pulse rate:   97 / minute Pulse rhythm:   regular BP sitting:   110 / 76  (left arm)  Vitals Entered By: Pura Spice, RN (January 25, 2010 9:54 AM) CC: cpx Sees Dr Chevis Pretty    History of Present Illness: This 65 year old white married female is in for complete physical examination I have her as her gynecological exam by Dr. Chevis Pretty and have this done today also bone density and mammogram Complaint of dry lips and slight tenderness, anxiety and depression controlled with medication Takes Actonel calcium and vitamin D for osteopenia  Allergies (verified): No Known Drug Allergies  Past History:  Past Medical History: GERD Migraine headaches Anxiety depression  Review of Systems      See HPI  The patient denies anorexia, fever, weight loss, weight gain, vision loss, decreased hearing, hoarseness, chest pain, syncope, dyspnea on exertion, peripheral edema, prolonged cough, headaches, hemoptysis, abdominal pain, melena, hematochezia, severe indigestion/heartburn, hematuria, incontinence, genital sores, muscle weakness, suspicious skin lesions, transient blindness, difficulty walking, depression, unusual weight change, abnormal bleeding, enlarged lymph nodes, angioedema, breast masses, and testicular masses.    Physical Exam  General:  Well-developed,well-nourished,in no acute distress; alert,appropriate and cooperative throughout examination Head:  Normocephalic and atraumatic without obvious abnormalities. No apparent alopecia or balding. Eyes:  No corneal or conjunctival inflammation noted. EOMI. Perrla. Funduscopic exam benign, without hemorrhages, exudates or papilledema. Vision grossly normal. Ears:  External ear exam shows no significant lesions or deformities.  Otoscopic examination reveals clear canals, tympanic  membranes are intact bilaterally without bulging, retraction, inflammation or discharge. Hearing is grossly normal bilaterally. Nose:  External nasal examination shows no deformity or inflammation. Nasal mucosa are pink and moist without lesions or exudates. Mouth:  pharynx pink and moist.   Neck:  No deformities, masses, or tenderness noted. Chest Wall:  No deformities, masses, or tenderness noted. Breasts:  No mass, nodules, thickening, tenderness, bulging, retraction, inflamation, nipple discharge or skin changes noted.   Lungs:  Normal respiratory effort, chest expands symmetrically. Lungs are clear to auscultation, no crackles or wheezes. Heart:  Normal rate and regular rhythm. S1 and S2 normal without gallop, murmur, click, rub or other extra sounds. Abdomen:  Bowel sounds positive,abdomen soft and non-tender without masses, organomegaly or hernias noted. Rectal:  not examined Genitalia:  not examined Msk:  No deformity or scoliosis noted of thoracic or lumbar spine.   Pulses:  R and L carotid,radial,femoral,dorsalis pedis and posterior tibial pulses are full and equal bilaterally Extremities:  No clubbing, cyanosis, edema, or deformity noted with normal full range of motion of all joints.   Neurologic:  No cranial nerve deficits noted. Station and gait are normal. Plantar reflexes are down-going bilaterally. DTRs are symmetrical throughout. Sensory, motor and coordinative functions appear intact. Skin:  Intact without suspicious lesions or rashes Cervical Nodes:  No lymphadenopathy noted Axillary Nodes:  No palpable lymphadenopathy Inguinal Nodes:  No significant adenopathy Psych:  Cognition and judgment appear intact. Alert and cooperative with normal attention span and concentration. No apparent delusions, illusions, hallucinations   Impression & Recommendations:  Problem # 1:  PHYSICAL EXAMINATION (ICD-V70.0) Assessment New  Problem # 2:  COMMON MIGRAINE (ICD-346.10) Assessment:  Improved  The following medications were removed from the medication list:    Ibuprofen 600 Mg Tabs (Ibuprofen) .Marland Kitchen... 1 tid Her updated medication list for this problem includes:    Imitrex 100 Mg Tabs (Sumatriptan succinate) .Marland Kitchen... 1 by mouth at the onset of migraine. may repeat dose in one hour if headache not resolved  Problem # 3:  ANXIETY DEPRESSION (ICD-300.4) Assessment: Improved  Problem # 4:  IRRITABLE BOWEL SYNDROME (ICD-564.1) Assessment: Improved  Problem # 5:  GERD (ICD-530.81) Assessment: Improved  Her updated medication list for this problem includes:    Omeprazole 40 Mg Cpdr (Omeprazole) .Marland Kitchen... 1 by mouth once daily    Pamine Fq 5 Mg Kit (Methscopol & intest flora reg) .Marland Kitchen... 1 three times a day ac as needed for irritable bowel  Problem # 6:  OSTEOPENIA (ICD-733.90) Assessment: Unchanged  Her updated medication list for this problem includes:    Actonel 150 Mg Tabs (Risedronate sodium) .Marland Kitchen... Take 1 by mouth monthly    Vitamin D (ergocalciferol) 50000 Unit Caps (Ergocalciferol) .Marland Kitchen... 1 weekly times 12 weeks  Orders: T-Vitamin D (25-Hydroxy) ([address]) Venipuncture (09811)  Complete Medication List: 1)  Citalopram Hydrobromide 40 Mg Tabs (Citalopram hydrobromide) .Marland Kitchen.. 1 by mouth once daily 2)  Analpram Hc  .... Insert and apply bid 3)  Diazepam 5 Mg Tabs (Diazepam) .Marland Kitchen.. 1 by mouth three times a day as needed stress 4)  Clotrimazole-betamethasone 1-0.05 % Crea (Clotrimazole-betamethasone) .... Use sparingly to rash on hand 5)  Actonel 150 Mg Tabs (Risedronate sodium) .... Take 1 by mouth monthly 6)  Imitrex 100 Mg Tabs (Sumatriptan succinate) .Marland Kitchen.. 1 by mouth at the onset of migraine. may repeat dose in one hour if headache not resolved 7)  Omeprazole 40 Mg Cpdr (Omeprazole) .Marland Kitchen.. 1 by mouth once daily 8)  Pamine Fq 5 Mg Kit (Methscopol & intest flora reg) .Marland Kitchen.. 1 three times a day ac as needed for irritable bowel 9)  Vitamin D (ergocalciferol) 50000 Unit Caps  (Ergocalciferol) .Marland KitchenMarland KitchenMarland Kitchen 1 weekly times 12 weeks  Patient Instructions: 1)  I find that you are in good physical condition with medical problems other well controlled with this time. 2)  Continue medications as prescribed and they will be renewed today Prescriptions: VITAMIN D (ERGOCALCIFEROL) 50000 UNIT CAPS (ERGOCALCIFEROL) 1 weekly times 12 weeks  #12 x 1   Entered and Authorized by:   Judithann Sheen MD   Signed by:   Judithann Sheen MD on 02/02/2010   Method used:   Electronically to        [facility]* (retail)       [address]       [place], [place]  [address]       Ph: [phone]       Fax: [phone]   RxID:   [id number] VITAMIN D (ERGOCALCIFEROL) 50000 UNIT CAPS (ERGOCALCIFEROL) 1 weekly times 12 weeks  #12 x 1   Entered and Authorized by:   Judithann Sheen MD   Signed by:   Judithann Sheen MD on 02/02/2010   Method used:   Electronically to        [facility]* (retail)       [address]       [place], [place]  [address]       Ph: [phone]       Fax: [phone]   RxID:   [id number] PAMINE FQ 5 MG KIT (METHSCOPOL &  INTEST FLORA REG) 1 three times a day ac as needed for irritable bowel  #90 x 11   Entered and Authorized by:   Judithann Sheen MD   Signed by:   Judithann Sheen MD on 01/25/2010   Method used:   Electronically to        [facility]* (retail)       [address]       [place], [place]  [address]       Ph: [phone]       Fax: [phone]   RxID:   [id number] DIAZEPAM 5 MG TABS (DIAZEPAM) 1 by mouth three times a day as needed stress  #90 x 5   Entered and Authorized by:   Judithann Sheen MD   Signed by:   Judithann Sheen MD on 01/25/2010   Method used:   Print then Give to Patient   RxID:   [id number] IMITREX 100 MG TABS (SUMATRIPTAN SUCCINATE) 1 by mouth at the onset of migraine. May repeat dose in one hour if headache not resolved  #9 x 11   Entered and  Authorized by:   Judithann Sheen MD   Signed by:   Judithann Sheen MD on 01/25/2010   Method used:   Electronically to        [facility]* (retail)       [address]       [place], [place]  [address]       Ph: [phone]       Fax: [phone]   RxID:   [id number] ACTONEL 150 MG TABS (RISEDRONATE SODIUM) take 1 by mouth monthly  #1 x 11   Entered and Authorized by:   Judithann Sheen MD   Signed by:   Judithann Sheen MD on 01/25/2010   Method used:   Electronically to        [facility]* (retail)       [address]       [place], [place]  [address]       Ph: [phone]       Fax: [phone]   RxID:   [id number] CLOTRIMAZOLE-BETAMETHASONE 1-0.05 % CREA (CLOTRIMAZOLE-BETAMETHASONE) use sparingly to rash on hand  #30gms x 11   Entered and Authorized by:   Judithann Sheen MD   Signed by:   Judithann Sheen MD on 01/25/2010   Method used:   Electronically to        [facility]* (retail)       [address]       [place], [place]  [address]       Ph: [phone]       Fax: [phone]   RxID:   [id number] CITALOPRAM HYDROBROMIDE 40 MG TABS (CITALOPRAM HYDROBROMIDE) 1 by mouth once daily  #30 x 11   Entered and Authorized by:   Judithann Sheen MD   Signed by:   Judithann Sheen MD on 01/25/2010   Method used:   Electronically to        [facility]* (retail)       [address]       [place], [place]  [address]       Ph: [phone]       Fax: [phone]   RxID:   [id number]

## 2010-11-22 NOTE — Consult Note (Signed)
Summary: [facility]  [facility]   Imported By: Maryln Gottron 06/12/2008 14:52:18  _____________________________________________________________________  External Attachment:    Type:   Image     Comment:   External Document

## 2010-11-22 NOTE — Progress Notes (Signed)
Summary: rx refill  allegrra  Phone Note Call from Patient Call back at [phone]   Caller: patient live Call For: [facility] Summary of Call: Patient would like rx refill for generic for allegra  180mg .  [facility] [phone] Initial call taken by: Celine Ahr,  February 19, 2008 2:02 PM  Follow-up for Phone Call        pt notified that allegra called in. Follow-up by: Pura Spice, RN,  February 20, 2008 3:23 PM    New/Updated Medications: FEXOFENADINE HCL 180 MG  TABS (FEXOFENADINE HCL) take 1 by mouth once daily   Prescriptions: FEXOFENADINE HCL 180 MG  TABS (FEXOFENADINE HCL) take 1 by mouth once daily  #30 x 11   Entered by:   Pura Spice, RN   Authorized by:   Judithann Sheen MD   Signed by:   Pura Spice, RN on 02/20/2008   Method used:   Electronically sent to ...       [facility]       [address]       [place], [place]  [address]       Ph: [phone]       Fax: [phone]   RxID:   [id number]

## 2010-11-22 NOTE — Procedures (Signed)
Summary: Colonoscopy Report/[facility]  Colonoscopy Report/[facility]   Imported By: Maryln Gottron 04/12/2009 08:02:48  _____________________________________________________________________  External Attachment:    Type:   Image     Comment:   External Document

## 2010-11-22 NOTE — Progress Notes (Signed)
Summary: PLEASE CALL RX IN AGAIN   Phone Note Call from Patient Call back at [phone]   Caller: PT LIVE Call For: STAFFORD  Summary of Call: ALLEGRA  RX WAS TO GO TO [facility] [phone].  THEY ARE TELLING THE PATIENT THEY DID NOT RECEIVE PLEASE CALL IN AGAIN  Initial call taken by: Roselle Locus,  May 20, 2009 11:37 AM  Follow-up for Phone Call        she told dr Scotty Court she wanted [facility] because she works there close by and i just called and she rx for this for 1 yr. if she wants its at [facility] she needs to call herseilf to [facility] and let them transfer the rx for her.  Follow-up by: Pura Spice, RN,  May 20, 2009 11:44 AM  Additional Follow-up for Phone Call Additional follow up Details #1::        Message left on patient's voice mail. Additional Follow-up by: Darra Lis RMA,  May 20, 2009 11:50 AM

## 2010-11-22 NOTE — Progress Notes (Signed)
Summary: new rx  Phone Note Call from Patient Call back at Home Phone [phone]   Caller: Patient Call For: Kim Sheen MD Summary of Call: pt needs new rx flonase nasal spray sent to [facility] [phone] Initial call taken by: Heron Sabins,  July 27, 2010 2:36 PM  Follow-up for Phone Call        fluticasone 2 sprays ea nostril daily as needed allergies, disp #1 and 1 Rf is fine Follow-up by: Danise Edge MD,  July 27, 2010 3:06 PM     Appended Document: new rx Medications Added FLUTICASONE PROPIONATE 50 MCG/ACT SUSP (FLUTICASONE PROPIONATE) 2 sprays in each nostril          Clinical Lists Changes  Medications: Added new medication of FLUTICASONE PROPIONATE 50 MCG/ACT SUSP (FLUTICASONE PROPIONATE) 2 sprays in each nostril - Signed Rx of FLUTICASONE PROPIONATE 50 MCG/ACT SUSP (FLUTICASONE PROPIONATE) 2 sprays in each nostril;  #1 x 1;  Signed;  Entered by: Josph Macho RMA;  Authorized by: Danise Edge MD;  Method used: Electronically to [facility] [address], [place], [place]  [address], Ph: [phone], Fax: [phone]    Prescriptions: FLUTICASONE PROPIONATE 50 MCG/ACT SUSP (FLUTICASONE PROPIONATE) 2 sprays in each nostril  #1 x 1   Entered by:   Josph Macho RMA   Authorized by:   Danise Edge MD   Signed by:   Josph Macho RMA on 07/28/2010   Method used:   Electronically to        [facility]* (retail)       [address]       [place], [place]  [address]       Ph: [phone]       Fax: [phone]   RxID:   [id number]    Appended Document: new rx Left message stating RX was called into pharmacy

## 2010-11-22 NOTE — Progress Notes (Signed)
Summary: RX REFILL  clotrimazole and betamethasone   Phone Note Call from Patient Call back at [phone]   Caller: PT LIVE Call For: Judithann Sheen MD Summary of Call: PATIENT NEEDS RX REFILL FOR CLOTRIMAZOLE AND BETAMETHAFONE DIPROPLONATE 1/0.05%.  [facility] [phone]. Initial call taken by: Celine Ahr,  December 17, 2008 10:31 AM  Follow-up for Phone Call        ok per dr Scotty Court for 1 yr  Follow-up by: Pura Spice, RN,  December 17, 2008 11:15 AM    New/Updated Medications: CLOTRIMAZOLE-BETAMETHASONE 1-0.05 % CREA (CLOTRIMAZOLE-BETAMETHASONE) use sparingly to rash on hand   Prescriptions: CLOTRIMAZOLE-BETAMETHASONE 1-0.05 % CREA (CLOTRIMAZOLE-BETAMETHASONE) use sparingly to rash on hand  #15 gram x 11   Entered by:   Pura Spice, RN   Authorized by:   Judithann Sheen MD   Signed by:   Pura Spice, RN on 12/17/2008   Method used:   Electronically to        [facility]* (retail)       [address]       [place], [place]  [address]       Ph: [phone]       Fax: [phone]   RxID:   [id number]

## 2010-11-22 NOTE — Assessment & Plan Note (Signed)
Summary: SORE THROAT CONGESTION AGAIN/MHF   Vital Signs:  Patient Profile:   65 Years Old Female Weight:      128 pounds O2 Sat:      98 % Temp:     98.4 degrees F Pulse rate:   82 / minute BP sitting:   120 / 80  (left arm) Cuff size:   regular  Vitals Entered By: Pura Spice, RN (July 21, 2008 4:12 PM)                 Chief Complaint:  sore throat and achy stated completed new type of amilox from urgent care 1 wk ago .  History of Present Illness: Had sore throat 2 week ago and tx urgent care with amoxiccillin , thought she was doing better but then low grade fever and sor throat much worse with aching and malaise. nasal congestion with drainage and slight cough no other complaints    Current Allergies (reviewed today): No known allergies      Review of Systems      See HPI  ENT      Complains of nasal congestion and sore throat.  Resp      Complains of cough.   Physical Exam  General:     Well-developed,well-nourished,in no acute distress; alert,appropriate and cooperative throughout examination Eyes:     No corneal or conjunctival inflammation noted. EOMI. Perrla. Funduscopic exam benign, without hemorrhages, exudates or papilledema. Vision grossly normal. Ears:     External ear exam shows no significant lesions or deformities.  Otoscopic examination reveals clear canals, tympanic membranes are intact bilaterally without bulging, retraction, inflammation or discharge. Hearing is grossly normal bilaterally. Nose:     nasal congestion Mouth:     beefy red pharynx with exudate large ant cervical nodes Chest Wall:     No deformities, masses, or tenderness noted. Lungs:     Normal respiratory effort, chest expands symmetrically. Lungs are clear to auscultation, no crackles or wheezes. Heart:     Normal rate and regular rhythm. S1 and S2 normal without gallop, murmur, click, rub or other extra sounds. Abdomen:     non-tender, normal bowel sounds,  no hepatomegaly, and no splenomegaly.   Cervical Nodes:     large tender ant cervical nodes Axillary Nodes:     No palpable lymphadenopathy Inguinal Nodes:     No significant adenopathy    Impression & Recommendations:  Problem # 1:  PHARYNGITIS-ACUTE (ICD-462) Assessment: New  Her updated medication list for this problem includes:    Levaquin 750 Mg Tabs (Levofloxacin) .Marland Kitchen... 1  each day for infection  Orders: Rocephin  250mg  (Z6109) Admin of Therapeutic Inj  intramuscular or subcutaneous (60454)   Problem # 2:  URI (ICD-465.9) Assessment: New  Her updated medication list for this problem includes:    Fexofenadine Hcl 180 Mg Tabs (Fexofenadine hcl) .Marland Kitchen... Take 1 by mouth once daily    Mucinex D 9203324595 Mg Xr12h-tab (Pseudoephedrine-guaifenesin) .Marland Kitchen... 1 bid   Problem # 3:  ANXIETY DEPRESSION (ICD-300.4) Assessment: Improved  Complete Medication List: 1)  Citalopram Hydrobromide 40 Mg Tabs (Citalopram hydrobromide) 2)  Analpram Hc  .... Insert and apply bid 3)  Actonel With Calcium 35-1250 Mg Tabs (Risedronate-calcium carbonate) .Marland Kitchen.. 1 tab monthly as instructed 4)  Fluticasone Propionate 50 Mcg/act Susp (Fluticasone propionate) .... 2 sprays each nostril once daily 5)  Fexofenadine Hcl 180 Mg Tabs (Fexofenadine hcl) .... Take 1 by mouth once daily 6)  Levaquin 750 Mg  Tabs (Levofloxacin) .Marland Kitchen.. 1  each day for infection 7)  Mucinex D 2041370013 Mg Xr12h-tab (Pseudoephedrine-guaifenesin) .Marland Kitchen.. 1 bid 8)  Diazepam 5 Mg Tabs (Diazepam) .Marland Kitchen.. 1 by mouth three times a day as needed stress   Patient Instructions: 1)  severe pharyngitis failed with amoxicillin 2)  URI- mucinex D extra strength 3)  rocephin 1.0 gm 4)  Levaquin 750mg  once daily x 7 days 5)  good fluid intake   Prescriptions: DIAZEPAM 5 MG TABS (DIAZEPAM) 1 by mouth three times a day as needed stress  #90 x 5   Entered by:   Pura Spice, RN   Authorized by:   Judithann Sheen MD   Signed by:   Pura Spice, RN on 07/22/2008   Method used:   Printed then faxed to ...       [facility] (retail)       [address]       [place], [place]  [address]       Ph: [phone]       Fax: [phone]   RxID:   [id number] LEVAQUIN 750 MG TABS (LEVOFLOXACIN) 1  each day for infection  #7 x 1   Entered and Authorized by:   Judithann Sheen MD   Signed by:   Judithann Sheen MD on 07/21/2008   Method used:   Electronically to        [facility] (retail)       [address]       [place], [place]  [address]       Ph: [phone]       Fax: [phone]   RxID:   [id number]  ]  Medication Administration  Injection # 1:    Medication: Rocephin  250mg     Diagnosis: PHARYNGITIS-ACUTE (ICD-462)    Route: IM    Site: RUOQ gluteus    Exp Date: 02/2011    Lot #: [id number]    Mfr: novaplus    Comments: 1 gram given    Patient tolerated injection without complications    Given by: Pura Spice, RN (July 22, 2008 8:28 AM)  Orders Added: 1)  Est. Patient Level III [06301] 2)  Rocephin  250mg  [J0696] 3)  Admin of Therapeutic Inj  intramuscular or subcutaneous [96372] 4)  Est. Patient Level III [60109]

## 2010-11-22 NOTE — Consult Note (Signed)
Summary: dr Loreta Ave note  dr Loreta Ave note   Imported By: Kassie Mends 01/03/2008 08:38:39  _____________________________________________________________________  External Attachment:    Type:   Image     Comment:   dr Loreta Ave note

## 2010-11-22 NOTE — Progress Notes (Signed)
Summary: new rx for protonix  Phone Note Call from Patient Call back at [phone]   Caller: pt live Call For: [facility] Summary of Call: Patient needs new rx for protonix 40mg ,  [facility] [phone] Initial call taken by: Celine Ahr,  July 29, 2008 1:55 PM  Follow-up for Phone Call        Rx sent electroniclaly, pt informed Follow-up by: Sid Falcon LPN,  August 19, 2008 1:38 PM    New/Updated Medications: PROTONIX 40 MG TBEC (PANTOPRAZOLE SODIUM) one tab daily   Prescriptions: PROTONIX 40 MG TBEC (PANTOPRAZOLE SODIUM) one tab daily  #30 x 3   Entered by:   Sid Falcon LPN   Authorized by:   Judithann Sheen MD   Signed by:   Sid Falcon LPN on 19/14/7829   Method used:   Electronically to        [facility] (retail)       [address]       [place], [place]  [address]       Ph: [phone]       Fax: [phone]   RxID:   [id number]

## 2011-01-19 ENCOUNTER — Other Ambulatory Visit: Payer: Self-pay

## 2011-01-20 ENCOUNTER — Other Ambulatory Visit (INDEPENDENT_AMBULATORY_CARE_PROVIDER_SITE_OTHER): Payer: 59 | Admitting: Family Medicine

## 2011-01-20 DIAGNOSIS — Z0000 Encounter for general adult medical examination without abnormal findings: Secondary | ICD-10-CM

## 2011-01-20 LAB — BASIC METABOLIC PANEL
BUN: 15 mg/dL (ref 6–23)
CO2: 31 mEq/L (ref 19–32)
Calcium: 9 mg/dL (ref 8.4–10.5)
Chloride: 108 mEq/L (ref 96–112)
Creatinine, Ser: 0.6 mg/dL (ref 0.4–1.2)
GFR: 98.88 mL/min (ref >60.00)
Glucose, Bld: 77 mg/dL (ref 70–99)
Potassium: 4.4 mEq/L (ref 3.5–5.1)
Sodium: 143 mEq/L (ref 135–145)

## 2011-01-20 LAB — CBC WITH DIFFERENTIAL/PLATELET
Basophils Absolute: 0 10*3/uL (ref 0.0–0.1)
Basophils Relative: 1 % (ref 0.0–3.0)
Eosinophils Absolute: 0.3 10*3/uL (ref 0.0–0.7)
Eosinophils Relative: 7.4 % — ABNORMAL HIGH (ref 0.0–5.0)
HCT: 40 % (ref 36.0–46.0)
Hemoglobin: 13.8 g/dL (ref 12.0–15.0)
Lymphocytes Relative: 34.2 % (ref 12.0–46.0)
Lymphs Abs: 1.5 10*3/uL (ref 0.7–4.0)
MCHC: 34.5 g/dL (ref 30.0–36.0)
MCV: 86.4 fl (ref 78.0–100.0)
Monocytes Absolute: 0.4 10*3/uL (ref 0.1–1.0)
Monocytes Relative: 9.1 % (ref 3.0–12.0)
Neutro Abs: 2.1 10*3/uL (ref 1.4–7.7)
Neutrophils Relative %: 48.3 % (ref 43.0–77.0)
Platelets: 201 10*3/uL (ref 150.0–400.0)
RBC: 4.63 Mil/uL (ref 3.87–5.11)
RDW: 13.8 % (ref 11.5–14.6)
WBC: 4.3 10*3/uL — ABNORMAL LOW (ref 4.5–10.5)

## 2011-01-20 LAB — LIPID PANEL
Cholesterol: 232 mg/dL — ABNORMAL HIGH (ref 0–200)
HDL: 70 mg/dL (ref >39.00)
Total CHOL/HDL Ratio: 3
Triglycerides: 59 mg/dL (ref 0.0–149.0)
VLDL: 11.8 mg/dL (ref 0.0–40.0)

## 2011-01-20 LAB — POCT URINALYSIS DIPSTICK
Bilirubin, UA: NEGATIVE
Blood, UA: NEGATIVE
Glucose, UA: NEGATIVE
Ketones, UA: NEGATIVE
Nitrite, UA: NEGATIVE
Protein, UA: NEGATIVE
Spec Grav, UA: 1.015
Urobilinogen, UA: 0.2
pH, UA: 7

## 2011-01-20 LAB — HEPATIC FUNCTION PANEL
ALT: 18 U/L (ref 0–35)
AST: 26 U/L (ref 0–37)
Albumin: 4.2 g/dL (ref 3.5–5.2)
Alkaline Phosphatase: 42 U/L (ref 39–117)
Bilirubin, Direct: 0.1 mg/dL (ref 0.0–0.3)
Total Bilirubin: 0.6 mg/dL (ref 0.3–1.2)
Total Protein: 6.7 g/dL (ref 6.0–8.3)

## 2011-01-20 LAB — TSH: TSH: 1.32 u[IU]/mL (ref 0.35–5.50)

## 2011-01-20 LAB — LDL CHOLESTEROL, DIRECT: Direct LDL: 148 mg/dL

## 2011-01-31 ENCOUNTER — Encounter: Payer: Self-pay | Admitting: Family Medicine

## 2011-02-10 ENCOUNTER — Telehealth: Payer: Self-pay

## 2011-02-10 NOTE — Telephone Encounter (Signed)
Form faxed back to [facility] for citalopram hbr 40 mg tabs 30 with 11 refills

## 2011-02-13 ENCOUNTER — Other Ambulatory Visit: Payer: Self-pay

## 2011-02-13 MED ORDER — CITALOPRAM HYDROBROMIDE 40 MG PO TABS: 40.0000 mg | ORAL_TABLET | Freq: Every day | ORAL | Status: DC

## 2011-02-13 NOTE — Telephone Encounter (Signed)
rx faxed to [facility] for citalopram hbr 40 mg tab #30 ok per Dr. Scotty Court

## 2011-02-23 ENCOUNTER — Encounter: Payer: Self-pay | Admitting: Family Medicine

## 2011-02-23 ENCOUNTER — Ambulatory Visit (INDEPENDENT_AMBULATORY_CARE_PROVIDER_SITE_OTHER): Payer: 59 | Admitting: Family Medicine

## 2011-02-23 VITALS — BP 120/80 | HR 94 | Temp 98.7°F | Ht 65.0 in | Wt 130.0 lb

## 2011-02-23 DIAGNOSIS — F419 Anxiety disorder, unspecified: Secondary | ICD-10-CM

## 2011-02-23 DIAGNOSIS — Z136 Encounter for screening for cardiovascular disorders: Secondary | ICD-10-CM

## 2011-02-23 DIAGNOSIS — F341 Dysthymic disorder: Secondary | ICD-10-CM

## 2011-02-23 DIAGNOSIS — F32A Depression, unspecified: Secondary | ICD-10-CM

## 2011-02-23 DIAGNOSIS — K219 Gastro-esophageal reflux disease without esophagitis: Secondary | ICD-10-CM

## 2011-02-23 DIAGNOSIS — J069 Acute upper respiratory infection, unspecified: Secondary | ICD-10-CM

## 2011-02-23 DIAGNOSIS — Z0000 Encounter for general adult medical examination without abnormal findings: Secondary | ICD-10-CM

## 2011-02-23 DIAGNOSIS — F329 Major depressive disorder, single episode, unspecified: Secondary | ICD-10-CM

## 2011-02-23 DIAGNOSIS — K589 Irritable bowel syndrome without diarrhea: Secondary | ICD-10-CM

## 2011-02-23 MED ORDER — PANTOPRAZOLE SODIUM 40 MG PO TBEC: 40.0000 mg | DELAYED_RELEASE_TABLET | Freq: Every day | ORAL | Status: DC

## 2011-02-23 MED ORDER — SUMATRIPTAN SUCCINATE 100 MG PO TABS: ORAL_TABLET | ORAL | Status: DC

## 2011-02-23 MED ORDER — CITALOPRAM HYDROBROMIDE 40 MG PO TABS: 40.0000 mg | ORAL_TABLET | Freq: Every day | ORAL | Status: DC

## 2011-02-23 MED ORDER — FLUTICASONE PROPIONATE 50 MCG/ACT NA SUSP: 2.0000 | Freq: Every day | NASAL | Status: DC

## 2011-02-23 MED ORDER — DIAZEPAM 5 MG PO TABS: 5.0000 mg | ORAL_TABLET | Freq: Three times a day (TID) | ORAL | Status: DC | PRN

## 2011-02-27 ENCOUNTER — Encounter: Payer: Self-pay | Admitting: Family Medicine

## 2011-02-27 NOTE — Progress Notes (Signed)
  Subjective:    Patient ID: Kim Daniel, female    DOB: Jun 11, 1946, 65 y.o.   MRN: [id number] This 65 year old white married female is in today for an annual physical examination she has seen a gynecologist for Pap smear one week previously as well as have had a mammogram which was negative. Her lites 5 days ago she had an upper start for her infection and now coughing, nonproductive She relates she has an infrequent migraines 2-3 however per mom control with Imitrex she had a colonoscopic exam in 2007 by Dr. Loreta Ave failed to mention she has continued to have some nasal congestion at night with difficulty sleeping related to GERD is well controlled with Protonix tension and anxiety control with Valium and Celexa heard she has no chest pain no shortness of breath electrocardiogram in 2010 IBS well-controlled timingHPI    Review of Systemssee H&P     Objective:   Physical Exam patient is a well well while her white female whose retroactive pleasant and cooperative does not appear her stated age of 25 HEENT reveals nasal congestion with a pale boggy nasal mucosa with some postnasal drainage pharynx is negative no anterior lymphadenopathy Lungs clear to palpation percussion and auscultation except for minimal rhonchi at the bases but no rales no dullness no wheezing Abdomen liver spleen and kidneys nonpalpable no tenderness bowel sounds are normal abdominal aorta normal to percussion Pelvic done by gynecology Extremities negative neurological no positive findings skin negative       Assessment & Plan:  Preventive health minus exam reveals a healthy female URI  nasal congestion treated with fluticasone nasal for a anxiety depression continue Valium and Celexa  Irritable bowel syndrome continue Pamine Migraine headaches continue Imitrex GERD continue use of Protonix

## 2011-02-27 NOTE — Patient Instructions (Signed)
C. Uvula were well physical examination reveals a normal healthy female  For her nasal congestion continue to use Zyrtec and Flonase nasal spray Irritable bowel syndrome continue Pamine Migraine headaches continue Imitrex GERD continue the use of Protonix

## 2011-03-07 NOTE — Assessment & Plan Note (Signed)
[facility]                                 ON-CALL NOTE   Kim Daniel, Kim Daniel                       MRN:          [id number]  DATE:11/22/2008                            DOB:          1946-07-16    PHONE NUMBER:  [phone].   REGULAR DOCTOR:  Dr. Scotty Court.   CHIEF COMPLAINT:  Back spasm.   The patient said she is having a back spasm in the middle of her back,  it is between her shoulder blades.  She has had this in the past.  It is  very much affected by moving and twisting and turning.  She is taking  some diclofenac and ibuprofen for it and has been putting heat on it  with a little improvement, but she thinks she needs a muscle relaxer to  get through the weekend before she can get to Dr. Charmian Muff office.  She has had this before and it has worked well.  She denies chest pain,  shortness of breath, fever, cough, or any other symptoms and has no  known drug allergies.  I instructed her not to mix diclofenac and  ibuprofen as they are of same class and can damage the stomach.  I  instructed her to continue doing heat.  I told her I would call her in  some Flexeril to her local drug store to try __________ today until she  can see Dr. Scotty Court this week and then if her symptoms worsen in anyway  or she develops other symptoms such as chest pain or shortness of  breath, she needs to go to the ER immediately.  I called in Flexeril 10  mg 1 p.o. t.i.d. p.r.n. back pain, #30 with no refills, to [facility] at  [phone].      Marne A. Tower, MD  Electronically Signed    MAT/MedQ  DD: 11/22/2008  DT: 11/22/2008  Job #: [id number]

## 2011-03-10 NOTE — Op Note (Signed)
NAME:  Kim Daniel, Kim Daniel NO.:  [id number]   MEDICAL RECORD NO.:  [id number]                   PATIENT TYPE:  INP   LOCATION:  2894                                 FACILITY:  [facility]   PHYSICIAN:  Hewitt Shorts, M.D.            DATE OF BIRTH:  April 03, 1946   DATE OF PROCEDURE:  03/10/2004  DATE OF DISCHARGE:                                 OPERATIVE REPORT   PREOPERATIVE DIAGNOSIS:  Cervical spondylosis and degenerative disc disease  and radiculopathy.   POSTOPERATIVE DIAGNOSIS:  Cervical spondylosis and degenerative disc disease  and radiculopathy.   PROCEDURE:  C4-C5 and C5-C6 anterior cervical discectomy and arthrodesis  with iliac crest allograft and Uniplate cervical plating.   SURGEON:  Hewitt Shorts, M.D.   ASSISTANT:  Coletta Memos, M.D.   ANESTHESIA:  General endotracheal anesthesia.   INDICATIONS FOR PROCEDURE:  The patient is a 65 year old woman who presented  with left cervical radiculopathy.  She was found to have disc herniation  causing neural impingement to the left sided position.  The decision was  made to proceed with a two level C4-C5 and C5-C6 anterior cervical  discectomy and arthrodesis.  Also noted is static anterior listhesis at C4  and C5.   PROCEDURE:  The patient was brought to the operating room and placed under  general endotracheal anesthesia.  The patient was placed in 10 pounds of  halter traction.  The neck was prepped with Betadine solution and draped in  a sterile fashion.  A horizontal incision was made on the left side of the  neck.  The line of the incision was infiltrated with local anesthetic with  epinephrine.  Dissection was carried down through the subcutaneous tissue  with Bovie cautery.  Electrocautery was used to maintain hemostasis.  Dissection was carried down through the avascular plane with the  sternocleidomastoid muscle and carotid artery laterally and trachea and  esophagus medially.  The  ventral aspect of the vertebral column was  identified and a localizing x-ray was taken and the C4-C5 and C5-C6  intervertebral disc space was identified.  Discectomy was begun with  incision in the annulus and continued with microcurets and pituitary  rongeurs.  Anterior spondylitic overgrowth was carefully removed and then  the microscope was draped and brought onto the field to provide instrument  magnification, illumination, and visualization and the remainder of the  decompression was performed using microdissection and microsurgical  technique.  Dissection was carried back to the disc space to the posterior  longitudinal ligament.  We cauterized the endplates and the corresponding  vertebra were removed using the microcurets along with the Micromax drill  and then using the Micromax drill and a 2 mm Kerrison punch with a thin  footplate, the posterior osteophytic overgrowth was removed.  We removed the  posterior longitudinal ligament at each level and then carefully examined  the foramina and decompressed the exiting neural  foramina.  Once the  decompression was completed, hemostasis was established with the use of  Gelfoam soaked in thrombin.  Then, using bone spacer, we sized the disc  space to 6 mm of height, we selected two 6 mm tricortical iliac crest  allograft.  They were hydrated in saline solution and positioned in the  intervertebral disc space and counter sunk.  Once both grafts were in  position, we selected a 28 mm Uniplate and this was positioned over the  fusion construct and secured to each of the vertebra with a single 12 mm  self-tapping screw.  Each screw hole was drilled and the self-tapping screw  placed.  Once all three screws were fully tightened, the locking cams were  tightened with a torque wrench.  The wound was then irrigated with  Bacitracin solution, checked for hemostasis which was established and  confirmed, and then we proceeded with closure.  An  x-ray was taken that  showed the grafts, plate and screws, all in good position and the alignment  was good.  The platysma was closed with interrupted inverted 2-0 Vicryl  suture, the subcutaneous and subcuticular layer was closed with interrupted  inverted 3-0 Vicryl sutures, and the skin was reapproximated with Dermabond.  The patient tolerated the procedure well.  Estimated blood loss was 50 mL.  Sponge counts were correct.  Following surgery, the patient was taken out of  traction, reversed from the anesthetic, extubated, and transferred to the  recovery room for further care.                                               Hewitt Shorts, M.D.    RWN/MEDQ  D:  03/10/2004  T:  03/11/2004  Job:  [id number]

## 2011-03-10 NOTE — Op Note (Signed)
[place]  Patient:    Kim Daniel, Kim Daniel Visit Number: [id number] MRN: [id number]          Service Type: END Location: ENDO Attending Physician:  Charna Elizabeth Dictated by:   Anselmo Rod, M.D. Proc. Date: 04/02/02 Admit Date:  04/02/2002 Discharge Date: 04/02/2002                             Operative Report  DATE OF BIRTH:  [birth year]  PROCEDURE PERFORMED:  Esophagogastroduodenoscopy.  ENDOSCOPIST:  Anselmo Rod, M.D.  INSTRUMENT USED:  Olympus video panendoscope.  INDICATIONS FOR PROCEDURE:  This 65 year old white female with history of epigastric pain, rule out peptic ulcer disease, esophagitis, gastritis.  PREPROCEDURE PREPARATION:  Informed consent was procured from the patient. The patient fasted for eight hours prior to procedure.  PREPROCEDURE PHYSICAL:  VITAL SIGNS:  The patient had stable vital signs.  NECK:  Supple.  CHEST:  Clear to auscultation as well as regular.  ABDOMEN:  Soft with normal bowel sounds.  DESCRIPTION OF THE PROCEDURE:  The patient was placed in left lateral decubitus position and sedated with 40 mg of Demerol and 5 mg of Versed intravenously. Once the patient is adequately sedated and maintained on low-flow oxygen and continuous cardiac monitoring, the Olympus video panendoscope was ran through the mouthpiece, over the tongue, and into the esophagus under direct vision. The entire esophagus appeared healthy without evidence of ring stricture, masses, esophagitis, or Barretts mucosa. The scope was then advanced into the stomach. There is mild diffuse gastritis throughout the gastric mucosa. No ulcers, erosions, masses, or polyps were seen. The proximal small bowel appeared normal and without lesions.  IMPRESSION:  Mild diffuse gastritis, otherwise, normal esophagogastroduodenoscopy. No hiatal hernia seen.  RECOMMENDATIONS: 1. Continue PPIs for now. 2. Outpatient followup in the next  two weeks. Follow up with the HIDA scan    to rule out biliary dyskinesia. Dictated by:   Anselmo Rod, M.D. Attending Physician:  Charna Elizabeth DD:  04/03/02 TD:  04/04/02 Job: [id number]

## 2011-03-10 NOTE — H&P (Signed)
[place]  Patient:    Kim Daniel Visit Number: [id number] MRN: [id number]          Service Type: MED Location: 5500 [id number] [id number] Attending Physician:  Talitha Givens Dictated by:   Luis Abed, M.D. [facility] Admit Date:  03/06/2002 Discharge Date: 03/07/2002   CC:         Leroy Sea., M.D.  Lacretia Leigh. Quintella Reichert, M.D.   History and Physical  HISTORY OF PRESENT ILLNESS:  Kim Daniel is a pleasant 65 year old female who was admitted today with chest pain.  She had been seen in April 2001 by Dr. Antoine Poche and at that time an outpatient Cardiolite scan showed that she had an ejection fraction of 71% and no significant ischemia.  The patient does have GERD.  Today while sitting she had some chest discomfort.  It began to worsen and she became concerned.  Ultimately she had chest pain with radiation to her arms.  She also had tingling in her fingers and around her mouth.  The patient was given nitroglycerin.  The pain seemed to improve over about 10 to 15 minutes.  This pain was similar to pains that she has had over the years but it seemed worse today.  She therefore came to the emergency room.  She is now stable and being admitted to evaluate her chest pain.  PAST MEDICAL HISTORY:  ALLERGIES:  None known.  MEDICATIONS: 1. Nexium 20. 2. Premarin 0.5 mg. 3. Celexa 20 mg daily.  OTHER MEDICAL PROBLEMS:  See the complete list below.  SOCIAL HISTORY:  Patient lives in [place].  She smoked heavily many years ago but then stopped and has not smoked for greater than five years.  She has occasional alcohol.  She has a Airline pilot job.  She says that today was not particularly stressful.  FAMILY HISTORY:  There is no strong family history of coronary disease.  REVIEW OF SYSTEMS:  The patient has had no fevers or chills.  She did have some feeling of warmth with the chest pain today.  HEENT:  There is a history of migraines but she  has only one approximately every three months.  She did not take any migraine-specific medications today.  The patient has no significant skin problems.  There are no major GU problems.  She has had some depression that is being treated.  She has some mild arthralgias and some osteoporosis.  She has decreased bowel movements in that despite medications she has a bowel movement only one to two per week.  The remainder of her review of systems is negative.  PHYSICAL EXAMINATION:  VITAL SIGNS:  Temperature 97.6, pulse 67, respirations 20, blood pressure 116/80.  GENERAL:  Overall, as I see her, she is quite stable and in no distress.  HEENT:  Reveals no significant abnormalities.  NECK:  Reveals no significant bruits.  There is no obvious lymphadenopathy.  CARDIAC:  Reveals an S1 with an S2.  There are no clicks or significant murmurs.  There is no reproducibility of the pain by palpation to her chest.  LUNGS:  Clear.  SKIN:  There are no obvious skin rashes.  ABDOMEN:  Benign.  GENITOURINARY AND RECTAL:  Deferred.  EXTREMITIES:  Reveal no significant edema.  NEUROLOGIC:  Grossly intact.  LABORATORY DATA:  EKG is compared with an old EKG.  There is very slight J point elevation in leads V2 and V3.  This is unchanged from the past.  Other labs available to [place] at this time reveal a hemoglobin of 12.8.  Her first troponin is normal.  We do not have her BMET yet.  Chest x-ray reveals no significant abnormalities.  PROBLEM LIST:  1. History of gastroesophageal reflux disease.  2. History of depression, being treated.  3. Migraine history, stable recently.  4. History of osteoporosis.  5. Postmenopausal.  6. Status post vaginal hysterectomy in the past and tubal ligation with     appendectomy in the past also.  7. Status post right lumpectomy in 1980.  8. Baseline EKG with slight J point elevation in leads V2 and V3. *9. Chest pain.  This is the major reason for the  admission today.  There are     some aspects of her pain that are concerning and others that seem     atypical.  At this point I feel that admission to evaluate her cardiac     status is definitely appropriate.  There is no proof of cardiac disease     at this point.  The patient will be admitted and enzymes will be checked.  If she has positive enzymes or more significant pain we will proceed with catheterization. However, at this point, it appears most appropriate to proceed with an in-hospital stress Cardiolite tomorrow on the treadmill.  This has been ordered and will be done, and we will try to get her home the same day, tomorrow evening, if the study is negative. Dictated by:   Luis Abed, M.D. [facility] Attending Physician:  Talitha Givens DD:  03/06/02 TD:  03/07/02 Job: [id number]

## 2011-03-10 NOTE — Assessment & Plan Note (Signed)
Kim Daniel                               PULMONARY OFFICE NOTE   Kim Daniel, Kim Daniel                       MRN:          [id number]  DATE:07/09/2006                            DOB:          1946-06-28    HISTORY:  A 65 year old white female who quit smoking in 2000 and has had  recurrent pneumonias occurring, I believe, in the right middle lobe  distribution for the last 4 years.  She typically responds rapidly to  antibiotics, but in between is left with and associates with this diagnosis  of pneumonia with purulent sputum, fever, and occasionally has right  anterior pleuritic pain associated with the syndrome.  However, within a few  days of taking antibiotics (she does not know which ones), it clears right  up.  The problem is that she is left with a dry cough in between  exacerbations, which are typically associated with dark yellow sputum but  never hemoptysis.  She also has mild chronic dyspnea on exertion, which  worsens during flare-ups of the cough.   She denies any ongoing pleuritic pain, fevers, chills, sweats, unintended  weight loss, or history of hemoptysis.   PAST MEDICAL HISTORY:  Significant for migraine headaches, remote  lumpectomy, appendectomy, hysterectomy, and back surgeries, with the most  recent surgery being a disk fusion that occurred in May 2006.   ALLERGIES:  None known.   MEDICATIONS:  1. Protonix once daily.  2. Celexa once daily.   SOCIAL HISTORY:  She quit smoking in 2000.  She works as a Associate Professor.   FAMILY HISTORY:  Positive for lung cancer in her father.  Otherwise,  negative for respiratory disease.   REVIEW OF SYSTEMS:  Taken in detail on the worksheet.  Negative, except as  outlined above.   PHYSICAL EXAM:  This is a pleasant, ambulatory white female in no acute  distress.  She has stable vital signs.  HEENT:  Unremarkable.  Oropharynx clear.  There is no deficit, turbinate  edema, or  excess mucus.  Oropharynx is also clear with no evidence of  excessive post-nasal drainage or cobblestoning.  NECK:  Supple without cervical adenopathy or tenderness.  Trachea is  midline.  No thyromegaly.  LUNGS:  The lung fields reveal minimal rhonchi bilaterally, more so over the  right than the left, anterior more than posterior.  CARDIAC:  Regular rate and rhythm without murmur, gallop, or rub.  ABDOMEN:  Soft.  Benign.  EXTREMITIES:  Warm without calf tenderness, cyanosis, clubbing, edema.  NEUROLOGIC:  Normal.  SKIN:  Unremarkable.   LAB DATA:  CT scan of the chest from February 16, 2006 and June 11, 2006 were  both reviewed indicating classic right middle lobe pattern of bronchiectasis  and micronodular infiltrates.   IMPRESSION:  Classic right middle lobe syndrome associated with  bronchiectasis and persistent infiltrates.  I explained to the patient that  typically this is an anatomic problem but becomes a source of recurrent  infection with unusual organisms, sometimes Gram-negative organisms, and  sometimes atypical mycobacteria organisms.  For now she seems  much better,  but will be at risk for future flare-ups and I believe it is important at  this point to define the microbiology more specifically.  I therefore  recommended the following.   1. Levaquin 750 for five days would be my choice for any further      exacerbations of purulent sputum, pleuritic pain (in the typical      distribution that she has it, which is a stereotypical well localized      pattern, which can be differentiated from pulmonary embolism and other      causes of pleuritic pain).  2. I also recommended proceeding with bronchoscopy with lavage and a      transbronchial biopsy can be done at that time as well to exclude      malignancy, which I think is very unlikely, but note, her family      history is positive and she has been a smoker.  I discussed the risks,      benefits, and alternatives  with her in detail.  She agreed to proceed      on July 17, 2006.                                   Kim Daniel. Kim Sires, MD, [facility]   MBW/MedQ  DD:  07/10/2006  DT:  07/10/2006  Job #:  [id number]   cc:   Ellin Saba., MD

## 2011-03-10 NOTE — Discharge Summary (Signed)
[facility]  Patient:    Kim Daniel, REXROAD Visit Number: [id number] MRN: [id number]          Service Type: MED Location: [id number] Attending Physician:  Talitha Givens Dictated by:   Pennelope Bracken, N.P. Admit Date:  03/06/2002 Discharge Date: 03/07/2002   CC:         Corwin Levins, M.D. [facility]   Discharge Summary  DATE OF BIRTH:  1946/01/23  CARDIOLOGIST:  Rollene Rotunda, M.D.  REASON FOR ADMISSION:  Chest pain.  DISCHARGE DIAGNOSES:  1. Chest pain, etiology uncertain, evaluated this admission with serial     enzymes which were negative and stress Cardiolite which was negative     for ischemia.  Ejection fraction estimated to be 75%.  2. Gastroesophageal reflux disease.  3. Depression.  4. Migraine headache.  5. Osteoporosis.  6. Postmenopausal on replacement.  7. Status post hysterectomy.  8. Tubal ligation with appendectomy.  9. Right lumpectomy in 1980. 10. Prior episode of chest pain evaluated with Cardiolite in 2001 showing no     ischemia and ejection fraction of 71%.  HISTORY OF PRESENT ILLNESS:  This delightful 65 year old lady with medical history outlined above presented with complaints of dull mid substernal chest pain with some associated neck pain, oral numbness and diaphoresis, blurred vision, and shortness of breath.  This pain came on today while the patient was sitting at work, lasted 15 minutes, and then worsened from 4/10 to 9/10. She was given one sublingual nitroglycerin which provided relief of the chest pain over the next 10 to 15 minutes.  Given the symptoms it was deemed prudent to admit her for further workup.  HOSPITAL COURSE:  The patient was admitted and placed on telemetry.  She was continued on her home medications.  Cardiac enzymes were drawn in three sets and these were negative.  She experienced no further chest pain while in the hospital and her EKG was without ischemic changes.  She  had an adenosine Cardiolite performed on hospital day #2 and she tolerated this well with excellent exercise capacity.  She experienced no chest pain while walking and had normal blood pressure and heart rate response to exercise.  No ST segment changes were seen that would suggest inducible ischemia and Cardiolite images revealed an ejection fraction of 75% without evidence of ischemia.  Based on these findings, it was decided to discharge the patient and continue her on her GERD medications.  She is advised to take 800 of ibuprofen t.i.d. for any musculoskeletal pain and to continue with her healthy lifestyle.  PHYSICAL EXAMINATION:  GENERAL:  On day of discharge, patient offered no complaints of chest pain or shortness of breath.  VITAL SIGNS:  Blood pressure 97/62, heart rate 66, respirations 20, temperature 97.8.  CHEST:  Clear to auscultation bilaterally.  CARDIOVASCULAR:  Regular rate and rhythm without murmur, rub, or gallop. Telemetry revealed sinus rhythm.  EXTREMITIES:  Without edema.  LABORATORY DATA:  Chemistry:  Sodium 143, potassium 3.5, chloride 111, CO2 26, BUN 11, creatinine 0.7, glucose 96.   Presentation CBC:  WBC 5.6, hemoglobin 12.8, hematocrit 37.3, platelets 232.  Lipid panel is still pending at time of discharge.  Cardiac markers as mentioned above were negative in three sets.  Chest x-ray showed no evidence of infiltrate or congestive heart failure.  DISPOSITION:  The patient is discharged to home in the care of her husband on the following medications.  DISCHARGE MEDICATIONS: 1. Celexa  20 mg one q.d. 2. Protonix 40 mg one q.d. 3. Aspirin 81 mg one q.d. 4. Premarin 0.3 mg one q.d. 5. Nitroglycerin sublingual 0.4 mg one every five minutes x3 p.r.n. chest    pain.  ACTIVITY:  Patient is urged to exercise gently, raising her heart rate for 30 minutes every day.  DIET:  She is advised to follow a heart healthy diet.  SPECIAL INSTRUCTIONS:  She is  urged to identify sources of stress in her life and to address these as much as possible.  FOLLOW-UP:  As needed with Dr. Myrtis Ser at the [facility].Dictated by: Pennelope Bracken, N.P. Attending Physician:  Talitha Givens DD:  03/07/02 TD:  03/07/02 Job: [id number]

## 2011-03-10 NOTE — Op Note (Signed)
NAME:  Kim Daniel, Kim Daniel              ACCOUNT NO.:  [id number]   MEDICAL RECORD NO.:  [id number]          PATIENT TYPE:  AMB   LOCATION:  CARD                         FACILITY:  [facility]   PHYSICIAN:  Casimiro Needle B. Sherene Sires, MD, FCCPDATE OF BIRTH:  [birth year]Jan-1946   DATE OF PROCEDURE:  08/28/2006  DATE OF DISCHARGE:                                 OPERATIVE REPORT   HISTORY AND INDICATIONS:  Please see dictated pulmonary consultation note in  the E-chart.  The patient agreed to the procedure after a full discussion of  the risks, benefits, and alternatives in the office and there has been no  change in history or exam since the office visit except for the fact that  she just took a course of Levaquin for pleuritic right chest pain which was  resolved on day two of therapy.  Her last day of Levaquin was November 3 and  never had fever, purulent sputum, or hemoptysis during this episode.   The procedure was performed in the bronchoscopy suite with continuous  monitoring by surface ECG and oximetry.  She maintained adequate saturations  throughout the procedure and sinus rhythm and was given a total of 2.5 mg IV  Versed and 50 mg IV Demerol for adequate sedation and cough suppression.  The patient was initially pre-medicated also with 1% lidocaine by nebulizer  and then received additional 2% lidocaine to the right nares.   Using a standard flexible fiberoptic bronchoscope, the right nares was  easily cannulated with good visualization of the entire pharynx and larynx.  The cords moved normally and there were no apparent upper airway lesions.  Using an additional 1% lidocaine as needed, the entire tracheobronchial tree  was explored bilaterally with the following findings:   Trachea, carina, and all the major airways were open widely to the  subsegmental level.  There were scattered mucopurulent secretions throughout  all the major airways which were easily suctioned free with the underlying  mucosa  appearing minimally inflamed and no endobronchial lesions or focal  endobronhical abnormalities appreciated, not specifically isolated to the  right middle lobe.  The right middle lobe was approached in a wedge position  and approximately 100 mL of saline was administered with return of mostly  clear fluid.   IMPRESSION:  Bronchiectasis with micronodular changes in the right middle  lobe suggesting MAI involvement and/or opportunistic infection.  It may well  be because she just finished a course of Levaquin that the cultures will  be negative.  If necessary, a repeat procedure with, perhaps, a  transbronchial biopsy could be considered off of antibiotics for at least  two weeks if clinically necessary.   The patient tolerated the procedure well and follow up will be arranged as  an outpatient.           ______________________________  Charlaine Dalton. Sherene Sires, MD, [facility]     MBW/MEDQ  D:  08/28/2006  T:  08/28/2006  Job:  [id number]   cc:   Ellin Saba., MD  [address] [place]  [place] [address]

## 2011-05-10 ENCOUNTER — Ambulatory Visit: Payer: 59 | Admitting: Family Medicine

## 2011-06-05 ENCOUNTER — Other Ambulatory Visit: Payer: Self-pay

## 2011-06-05 MED ORDER — CLOTRIMAZOLE-BETAMETHASONE 1-0.05 % EX CREA: 1.0000 "application " | TOPICAL_CREAM | Freq: Every day | CUTANEOUS | Status: DC

## 2011-09-01 ENCOUNTER — Encounter: Payer: Self-pay | Admitting: Internal Medicine

## 2011-09-01 ENCOUNTER — Ambulatory Visit (INDEPENDENT_AMBULATORY_CARE_PROVIDER_SITE_OTHER): Payer: 59 | Admitting: Internal Medicine

## 2011-09-01 VITALS — BP 120/80 | HR 80 | Temp 98.5°F | Wt 134.0 lb

## 2011-09-01 DIAGNOSIS — R5383 Other fatigue: Secondary | ICD-10-CM

## 2011-09-01 DIAGNOSIS — R5381 Other malaise: Secondary | ICD-10-CM

## 2011-09-01 DIAGNOSIS — Z8701 Personal history of pneumonia (recurrent): Secondary | ICD-10-CM

## 2011-09-01 DIAGNOSIS — Z87891 Personal history of nicotine dependence: Secondary | ICD-10-CM

## 2011-09-01 DIAGNOSIS — R0602 Shortness of breath: Secondary | ICD-10-CM

## 2011-09-01 DIAGNOSIS — R05 Cough: Secondary | ICD-10-CM

## 2011-09-01 DIAGNOSIS — F341 Dysthymic disorder: Secondary | ICD-10-CM

## 2011-09-01 DIAGNOSIS — R059 Cough, unspecified: Secondary | ICD-10-CM | POA: Insufficient documentation

## 2011-09-01 DIAGNOSIS — R259 Unspecified abnormal involuntary movements: Secondary | ICD-10-CM

## 2011-09-01 DIAGNOSIS — R251 Tremor, unspecified: Secondary | ICD-10-CM

## 2011-09-01 MED ORDER — DIAZEPAM 5 MG PO TABS: 5.0000 mg | ORAL_TABLET | Freq: Three times a day (TID) | ORAL | Status: DC | PRN

## 2011-09-01 NOTE — Patient Instructions (Signed)
Get chest xray and will contact about results. Will arrange pulmonary function tests in the meantime This could be a residual from the infection an will get better.  Valium can cause fatigue and depression   But is dependent producing if used all the time.  Then then plan FU.

## 2011-09-01 NOTE — Assessment & Plan Note (Signed)
Feels could be better  Needs to come back for discussion in this area  Has fatigue could be contributing

## 2011-09-01 NOTE — Progress Notes (Signed)
Subjective:    Patient ID: Kim Daniel, female    DOB: September 23, 1946, 65 y.o.   MRN: [id number]  HPI  Patient comes in today for SDA  For acute problem evaluation. But has couple of  issues.  However she has been seen elswhere for this  Acute problem but comes in for Friday appt because having a persistent dry cough . About 2 weeks ago had one day of uri sx and was seen at  [facility]  Urgent care for" sinus thing"   And rx with augmentin and prednisone .Marland KitchenThe sx then  moved into chest ;was given cough med. No cp sob   Fatigue ongoing no fever sweats weight loss   Hx of pneumonia  In the past.  No asthma  Tobacco 19- 84 age and then  Off for 12 years.   Hx of wheezing the past with infecttion.   PCP dr Scotty Court who is retiring.  Review of Systems On valium prn anxiety thinks the depression meds not working that well . Only taking valium ocassionally   Has on imitrex none recently Shakiness at times not related to meds   Past history family history social history reviewed in the electronic medical record.  Outpatient Encounter Prescriptions as of 09/01/2011  Medication Sig Dispense Refill  . citalopram (CELEXA) 40 MG tablet Take 1 tablet (40 mg total) by mouth daily.  30 tablet  11  . clotrimazole-betamethasone (LOTRISONE) cream Apply 1 application topically daily. Use sparingly to rash on hand  30 g  5  . diazepam (VALIUM) 5 MG tablet Take 1 tablet (5 mg total) by mouth 3 (three) times daily as needed for anxiety.  60 tablet  0  . ergocalciferol (VITAMIN D2) 50000 UNITS capsule Take 50,000 Units by mouth once a week.        . fluticasone (FLONASE) 50 MCG/ACT nasal spray 2 sprays by Nasal route daily.  16 g  11  . Hydrocortisone Ace-Pramoxine [facility] RE) Place rectally. Insert and apply bid       . Methscopol & Intest Flora Reg (PAMINE FQ) 5 MG KIT Take by mouth. 1 three times a day ac as needed for IBS       . pantoprazole (PROTONIX) 40 MG tablet Take 1 tablet (40 mg total)  by mouth daily.  30 tablet  11  . SUMAtriptan (IMITREX) 100 MG tablet 1 by mouth at the onset of migraine.  May repeat dose in one hour if headache not resolved  9 tablet  11  . DISCONTD: diazepam (VALIUM) 5 MG tablet Take 1 tablet (5 mg total) by mouth 3 (three) times daily as needed.  90 tablet  5       Objective:   Physical Exam BP 120/80  Pulse 80  Temp(Src) 98.5 F (36.9 C) (Oral)  Wt 134 lb (60.782 kg)  SpO2 99% WDWN  In nad HEENT: Normocephalic ;atraumatic , Eyes;  PERRL, EOMs  Full, lids and conjunctiva clear,,Ears: no deformities, canals nl, TM landmarks normal, Nose: no deformity or discharge  Mouth : OP clear without lesion or edema . Neck: Supple without adenopathy or masses or bruits Chest:  Clear to A&P without wheezes rales or rhonchi CV:  S1-S2 no gallops or murmurs peripheral perfusion is normal Skin: normal capillary refill ,turgor , color: No acute rashes ,petechiae or bruising Psych  Good eye contact oriented   Oriented x 3 and no noted deficits in memory, attention, and speech. Neuro non focal  Assessment & Plan:  Cough  prob persistent from rti treated elsewhere with antibiotics and pred  Past hx of pna  Get c xray and pfts  and plan  Fu for results   Fatigue  Pos  Multifactorial depression anxiety     Risk benefit of medication discussed.    Limit valium will refill to day.

## 2011-09-02 DIAGNOSIS — R5383 Other fatigue: Secondary | ICD-10-CM | POA: Insufficient documentation

## 2011-09-02 DIAGNOSIS — R251 Tremor, unspecified: Secondary | ICD-10-CM | POA: Insufficient documentation

## 2011-09-02 DIAGNOSIS — Z8701 Personal history of pneumonia (recurrent): Secondary | ICD-10-CM | POA: Insufficient documentation

## 2011-09-02 DIAGNOSIS — Z87891 Personal history of nicotine dependence: Secondary | ICD-10-CM | POA: Insufficient documentation

## 2011-09-02 DIAGNOSIS — R0602 Shortness of breath: Secondary | ICD-10-CM | POA: Insufficient documentation

## 2012-02-06 ENCOUNTER — Ambulatory Visit (INDEPENDENT_AMBULATORY_CARE_PROVIDER_SITE_OTHER): Payer: 59 | Admitting: Family Medicine

## 2012-02-06 ENCOUNTER — Encounter: Payer: Self-pay | Admitting: Family Medicine

## 2012-02-06 VITALS — BP 102/78 | HR 72 | Temp 98.5°F | Resp 12 | Ht 64.5 in | Wt 132.0 lb

## 2012-02-06 DIAGNOSIS — G43009 Migraine without aura, not intractable, without status migrainosus: Secondary | ICD-10-CM

## 2012-02-06 DIAGNOSIS — F341 Dysthymic disorder: Secondary | ICD-10-CM

## 2012-02-06 DIAGNOSIS — Z0000 Encounter for general adult medical examination without abnormal findings: Secondary | ICD-10-CM

## 2012-02-06 DIAGNOSIS — M899 Disorder of bone, unspecified: Secondary | ICD-10-CM

## 2012-02-06 DIAGNOSIS — M949 Disorder of cartilage, unspecified: Secondary | ICD-10-CM

## 2012-02-06 DIAGNOSIS — K219 Gastro-esophageal reflux disease without esophagitis: Secondary | ICD-10-CM

## 2012-02-06 MED ORDER — DIAZEPAM 5 MG PO TABS: 5.0000 mg | ORAL_TABLET | Freq: Two times a day (BID) | ORAL | Status: DC | PRN

## 2012-02-06 MED ORDER — SUMATRIPTAN SUCCINATE 100 MG PO TABS: ORAL_TABLET | ORAL | Status: DC

## 2012-02-06 NOTE — Progress Notes (Signed)
  Subjective:    Patient ID: Kim Daniel, female    DOB: Apr 17, 1946, 66 y.o.   MRN: [id number]  HPI  Medical followup. Patient reestablishing with me. Past medical history reviewed. She has history of GERD, irritable bowel syndrome, migraine headaches, depression, chronic insomnia. Medications reviewed. Compliant with all. She has apparently for several years been on diazepam 5 mg mostly takes at night and occasionally during the daytime for severe anxiety symptoms. Takes Celexa 40 mg daily and depression and anxiety well controlled. Former smoker. Quit about 20 years ago. Continues to see gynecologist regularly.  GERD stable with Protonix. History of osteopenia takes calcium but not consistent vitamin D. Migraine headaches are infrequent and controlled with Imitrex. These have decreased in frequency during menopause.  Past Medical History  Diagnosis Date  . GERD (gastroesophageal reflux disease)   . History of migraine headaches   . Anxiety and depression    No past surgical history on file.  reports that she has quit smoking. She has never used smokeless tobacco. She reports that she does not drink alcohol or use illicit drugs. family history is not on file. No Known Allergies    Review of Systems  Constitutional: Negative for fever, chills, appetite change and unexpected weight change.  Respiratory: Negative for cough and shortness of breath.   Cardiovascular: Negative for chest pain.  Gastrointestinal: Negative for abdominal pain.  Genitourinary: Negative for dysuria.  Neurological: Negative for dizziness and syncope.       Objective:   Physical Exam  Constitutional: She appears well-developed and well-nourished. No distress.  HENT:  Mouth/Throat: Oropharynx is clear and moist.  Neck: Neck supple. No thyromegaly present.  Cardiovascular: Normal rate and regular rhythm.   Pulmonary/Chest: Effort normal and breath sounds normal. No respiratory distress. She has no wheezes.  She has no rales.  Musculoskeletal: She exhibits no edema.  Lymphadenopathy:    She has no cervical adenopathy.  Psychiatric: She has a normal mood and affect. Her behavior is normal. Thought content normal.          Assessment & Plan:  #1 GERD stable continue Protonix  #2 history of depression stable on Celexa  #3 history of chronic insomnia. Refill diazepam. Caution about risk of side effects such as falls. She has no side effects previously #4 migraine headaches stable continue Imitrex as needed with refills given  #5 health maintenance. Schedule complete physical

## 2012-02-09 ENCOUNTER — Telehealth: Payer: Self-pay | Admitting: Family Medicine

## 2012-02-09 MED ORDER — CITALOPRAM HYDROBROMIDE 40 MG PO TABS: 40.0000 mg | ORAL_TABLET | Freq: Every day | ORAL | Status: DC

## 2012-02-09 NOTE — Telephone Encounter (Signed)
Patient called stating that she need a refill of her citalopram sent to [facility]. Please assist.

## 2012-02-19 ENCOUNTER — Other Ambulatory Visit (INDEPENDENT_AMBULATORY_CARE_PROVIDER_SITE_OTHER): Payer: 59

## 2012-02-19 DIAGNOSIS — Z0000 Encounter for general adult medical examination without abnormal findings: Secondary | ICD-10-CM

## 2012-02-19 LAB — BASIC METABOLIC PANEL
BUN: 11 mg/dL (ref 6–23)
CO2: 27 mEq/L (ref 19–32)
Calcium: 9.3 mg/dL (ref 8.4–10.5)
Chloride: 104 mEq/L (ref 96–112)
Creatinine, Ser: 0.8 mg/dL (ref 0.4–1.2)
GFR: 76.18 mL/min (ref >60.00)
Glucose, Bld: 86 mg/dL (ref 70–99)
Potassium: 4.2 mEq/L (ref 3.5–5.1)
Sodium: 140 mEq/L (ref 135–145)

## 2012-02-19 LAB — CBC WITH DIFFERENTIAL/PLATELET
Basophils Absolute: 0.1 10*3/uL (ref 0.0–0.1)
Basophils Relative: 0.7 % (ref 0.0–3.0)
Eosinophils Absolute: 0.4 10*3/uL (ref 0.0–0.7)
Eosinophils Relative: 4.5 % (ref 0.0–5.0)
HCT: 41.5 % (ref 36.0–46.0)
Hemoglobin: 13.9 g/dL (ref 12.0–15.0)
Lymphocytes Relative: 20.9 % (ref 12.0–46.0)
Lymphs Abs: 1.8 10*3/uL (ref 0.7–4.0)
MCHC: 33.5 g/dL (ref 30.0–36.0)
MCV: 86.1 fl (ref 78.0–100.0)
Monocytes Absolute: 0.8 10*3/uL (ref 0.1–1.0)
Monocytes Relative: 9 % (ref 3.0–12.0)
Neutro Abs: 5.5 10*3/uL (ref 1.4–7.7)
Neutrophils Relative %: 64.9 % (ref 43.0–77.0)
Platelets: 253 10*3/uL (ref 150.0–400.0)
RBC: 4.82 Mil/uL (ref 3.87–5.11)
RDW: 14 % (ref 11.5–14.6)
WBC: 8.4 10*3/uL (ref 4.5–10.5)

## 2012-02-19 LAB — HEPATIC FUNCTION PANEL
ALT: 15 U/L (ref 0–35)
AST: 27 U/L (ref 0–37)
Albumin: 4.1 g/dL (ref 3.5–5.2)
Alkaline Phosphatase: 43 U/L (ref 39–117)
Bilirubin, Direct: 0 mg/dL (ref 0.0–0.3)
Total Bilirubin: 0.3 mg/dL (ref 0.3–1.2)
Total Protein: 6.9 g/dL (ref 6.0–8.3)

## 2012-02-19 LAB — LIPID PANEL
Cholesterol: 206 mg/dL — ABNORMAL HIGH (ref 0–200)
HDL: 73.1 mg/dL (ref >39.00)
Total CHOL/HDL Ratio: 3
Triglycerides: 104 mg/dL (ref 0.0–149.0)
VLDL: 20.8 mg/dL (ref 0.0–40.0)

## 2012-02-19 LAB — LDL CHOLESTEROL, DIRECT: Direct LDL: 115.3 mg/dL

## 2012-02-19 LAB — TSH: TSH: 2.62 u[IU]/mL (ref 0.35–5.50)

## 2012-02-20 NOTE — Progress Notes (Signed)
Quick Note:  You just saw her to establish on 02/06/12 - no future appts on the books at this time ______

## 2012-03-02 ENCOUNTER — Inpatient Hospital Stay (HOSPITAL_COMMUNITY)
Admission: EM | Admit: 2012-03-02 | Discharge: 2012-03-05 | DRG: 470 | Disposition: A | Discharge status: Home Health | Payer: 59 | Attending: Specialist | Admitting: Specialist

## 2012-03-02 ENCOUNTER — Emergency Department (HOSPITAL_COMMUNITY): Payer: 59

## 2012-03-02 ENCOUNTER — Encounter (HOSPITAL_COMMUNITY): Payer: Self-pay | Admitting: *Deleted

## 2012-03-02 DIAGNOSIS — F3289 Other specified depressive episodes: Secondary | ICD-10-CM | POA: Diagnosis present

## 2012-03-02 DIAGNOSIS — F411 Generalized anxiety disorder: Secondary | ICD-10-CM | POA: Diagnosis present

## 2012-03-02 DIAGNOSIS — E876 Hypokalemia: Secondary | ICD-10-CM | POA: Diagnosis present

## 2012-03-02 DIAGNOSIS — W19XXXA Unspecified fall, initial encounter: Secondary | ICD-10-CM

## 2012-03-02 DIAGNOSIS — F329 Major depressive disorder, single episode, unspecified: Secondary | ICD-10-CM | POA: Diagnosis present

## 2012-03-02 DIAGNOSIS — S72002A Fracture of unspecified part of neck of left femur, initial encounter for closed fracture: Secondary | ICD-10-CM

## 2012-03-02 DIAGNOSIS — S72043A Displaced fracture of base of neck of unspecified femur, initial encounter for closed fracture: Principal | ICD-10-CM | POA: Diagnosis present

## 2012-03-02 DIAGNOSIS — K219 Gastro-esophageal reflux disease without esophagitis: Secondary | ICD-10-CM | POA: Diagnosis present

## 2012-03-02 DIAGNOSIS — Y92410 Unspecified street and highway as the place of occurrence of the external cause: Secondary | ICD-10-CM

## 2012-03-02 HISTORY — DX: Major depressive disorder, single episode, unspecified: F32.9

## 2012-03-02 HISTORY — DX: Anxiety disorder, unspecified: F41.9

## 2012-03-02 HISTORY — DX: Depression, unspecified: F32.A

## 2012-03-02 LAB — CBC
HCT: 38 % (ref 36.0–46.0)
Hemoglobin: 13 g/dL (ref 12.0–15.0)
MCH: 28.3 pg (ref 26.0–34.0)
MCHC: 34.2 g/dL (ref 30.0–36.0)
MCV: 82.6 fL (ref 78.0–100.0)
Platelets: 224 10*3/uL (ref 150–400)
RBC: 4.6 MIL/uL (ref 3.87–5.11)
RDW: 13.1 % (ref 11.5–15.5)
WBC: 14 10*3/uL — ABNORMAL HIGH (ref 4.0–10.5)

## 2012-03-02 LAB — DIFFERENTIAL
Basophils Absolute: 0 10*3/uL (ref 0.0–0.1)
Basophils Relative: 0 % (ref 0–1)
Eosinophils Absolute: 0.1 10*3/uL (ref 0.0–0.7)
Eosinophils Relative: 1 % (ref 0–5)
Lymphocytes Relative: 12 % (ref 12–46)
Lymphs Abs: 1.7 10*3/uL (ref 0.7–4.0)
Monocytes Absolute: 0.8 10*3/uL (ref 0.1–1.0)
Monocytes Relative: 6 % (ref 3–12)
Neutro Abs: 11.4 10*3/uL — ABNORMAL HIGH (ref 1.7–7.7)
Neutrophils Relative %: 81 % — ABNORMAL HIGH (ref 43–77)

## 2012-03-02 LAB — URINALYSIS, ROUTINE W REFLEX MICROSCOPIC
Bilirubin Urine: NEGATIVE
Glucose, UA: NEGATIVE mg/dL
Hgb urine dipstick: NEGATIVE
Leukocytes, UA: NEGATIVE
Nitrite: NEGATIVE
Protein, ur: NEGATIVE mg/dL
Specific Gravity, Urine: 1.011 (ref 1.005–1.030)
Urobilinogen, UA: 0.2 mg/dL (ref 0.0–1.0)
pH: 7.5 (ref 5.0–8.0)

## 2012-03-02 LAB — APTT: aPTT: 30 seconds (ref 24–37)

## 2012-03-02 LAB — COMPREHENSIVE METABOLIC PANEL
ALT: 15 U/L (ref 0–35)
AST: 28 U/L (ref 0–37)
Albumin: 4.1 g/dL (ref 3.5–5.2)
Alkaline Phosphatase: 46 U/L (ref 39–117)
BUN: 13 mg/dL (ref 6–23)
CO2: 23 mEq/L (ref 19–32)
Calcium: 8.9 mg/dL (ref 8.4–10.5)
Chloride: 108 mEq/L (ref 96–112)
Creatinine, Ser: 0.74 mg/dL (ref 0.50–1.10)
GFR calc Af Amer: >90 mL/min (ref >90)
GFR calc non Af Amer: 87 mL/min — ABNORMAL LOW (ref >90)
Glucose, Bld: 109 mg/dL — ABNORMAL HIGH (ref 70–99)
Potassium: 3 mEq/L — ABNORMAL LOW (ref 3.5–5.1)
Sodium: 142 mEq/L (ref 135–145)
Total Bilirubin: 0.3 mg/dL (ref 0.3–1.2)
Total Protein: 6.5 g/dL (ref 6.0–8.3)

## 2012-03-02 LAB — PROTIME-INR
INR: 1.07 (ref 0.00–1.49)
Prothrombin Time: 14.1 seconds (ref 11.6–15.2)

## 2012-03-02 LAB — SAMPLE TO BLOOD BANK

## 2012-03-02 MED ORDER — ONDANSETRON HCL 4 MG/2ML IJ SOLN
4.0000 mg | INTRAMUSCULAR | Status: DC | PRN
  Filled 2012-03-02 (×2): qty 2

## 2012-03-02 MED ORDER — POTASSIUM CHLORIDE CRYS ER 20 MEQ PO TBCR: 40.0000 meq | EXTENDED_RELEASE_TABLET | Freq: Once | ORAL | Status: DC

## 2012-03-02 MED ORDER — POTASSIUM CHLORIDE 10 MEQ/100ML IV SOLN
10.0000 meq | INTRAVENOUS | Status: AC
  Administered 2012-03-02 – 2012-03-03 (×3): 10 meq via INTRAVENOUS
  Filled 2012-03-02 (×3): qty 100

## 2012-03-02 MED ORDER — HYDROMORPHONE HCL PF 1 MG/ML IJ SOLN
1.0000 mg | INTRAMUSCULAR | Status: DC | PRN
  Administered 2012-03-02 – 2012-03-03 (×2): 1 mg via INTRAVENOUS
  Filled 2012-03-02 (×2): qty 1

## 2012-03-02 MED ORDER — HYDROMORPHONE HCL PF 1 MG/ML IJ SOLN
1.0000 mg | Freq: Once | INTRAMUSCULAR | Status: AC
  Administered 2012-03-02: 1 mg via INTRAVENOUS
  Filled 2012-03-02: qty 1
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
## 2012-03-02 NOTE — ED Notes (Signed)
Bed:WHALA<BR> Expected date:03/02/12<BR> Expected time:<BR> Means of arrival:Ambulance<BR> Comments:<BR> EMS 3 RK 65 yof fall w hip fx

## 2012-03-02 NOTE — ED Notes (Signed)
Pt received morphine 6 mg IV  Via 20 gauge left hand and Zofran 4 mg IV,  NS  500 cc bag received 400 cc NS

## 2012-03-02 NOTE — ED Provider Notes (Signed)
History     CSN: [id number]  Arrival date & time 03/02/12  1909   First MD Initiated Contact with Patient 03/02/12 1953      Chief Complaint  Patient presents with  . Fall    left hip pain,  left leg shortened and rotated    (Consider location/radiation/quality/duration/timing/severity/associated sxs/prior treatment) HPI  Patient relates she was getting out of her car and she tripped and fell landing on her left hip. She reports immediately she had pain and she states when she tried to move her leg she felt bones moving. She denies hitting her head or loss of consciousness. She denies chest pain back pain abdominal pain. Patient presents via EMS and she is art he received 6 mg of morphine with minimal relief of her pain.  Patient last ate about 3 PM  PCP Dr. Caryl Never  Past Medical History  Diagnosis Date  . GERD (gastroesophageal reflux disease)   . History of migraine headaches   . Anxiety and depression     History reviewed. No pertinent past surgical history.  History reviewed. No pertinent family history.  History  Substance Use Topics  . Smoking status: Former Games developer  . Smokeless tobacco: Never Used   Comment: quit 11 years ago   . Alcohol Use: No  lives with spouse employed  OB History    Grav Para Term Preterm Abortions TAB SAB Ect Mult Living                  Review of Systems  All other systems reviewed and are negative.    Allergies  Review of patient's allergies indicates no known allergies.  Home Medications   Current Outpatient Rx  Name Route Sig Dispense Refill  . CITALOPRAM HYDROBROMIDE 40 MG PO TABS Oral Take 1 tablet (40 mg total) by mouth daily. 30 tablet 11  . DIAZEPAM 5 MG PO TABS Oral Take 1 tablet (5 mg total) by mouth every 12 (twelve) hours as needed. 60 tablet 3  . FLUTICASONE PROPIONATE 50 MCG/ACT NA SUSP Nasal Place 2 sprays into the nose as needed.    . IBUPROFEN 200 MG PO TABS Oral Take 200 mg by mouth every 6 (six) hours  as needed. Pain.    Marland Kitchen PANTOPRAZOLE SODIUM 40 MG PO TBEC Oral Take 40 mg by mouth daily.    . SUMATRIPTAN SUCCINATE 100 MG PO TABS  1 by mouth at the onset of migraine.  May repeat dose in one hour if headache not resolved 9 tablet 11    BP 114/73  Pulse 76  Temp(Src) 98.7 F (37.1 C) (Oral)  Resp 27  SpO2 100%  Vital signs normal    Physical Exam  Nursing note and vitals reviewed. Constitutional: She is oriented to person, place, and time. She appears well-developed and well-nourished.  Non-toxic appearance. She does not appear ill. No distress.  HENT:  Head: Normocephalic and atraumatic.  Right Ear: External ear normal.  Left Ear: External ear normal.  Nose: Nose normal. No mucosal edema or rhinorrhea.  Mouth/Throat: Oropharynx is clear and moist and mucous membranes are normal. No dental abscesses or uvula swelling.  Eyes: Conjunctivae and EOM are normal. Pupils are equal, round, and reactive to light.  Neck: Normal range of motion and full passive range of motion without pain. Neck supple.  Pulmonary/Chest: Effort normal and breath sounds normal. No respiratory distress. She has no rhonchi. She exhibits no crepitus.  Abdominal: Soft. Normal appearance. She exhibits no distension. There  is no tenderness. There is no rebound and no guarding.  Musculoskeletal: Normal range of motion. She exhibits no edema and no tenderness.       Patient's noted to have shortening and external rotation of her left lower leg. She has pain in her left hip area. She has intact distal pulses  Neurological: She is alert and oriented to person, place, and time. She has normal strength. No cranial nerve deficit.  Skin: Skin is warm, dry and intact. No rash noted. No erythema. No pallor.  Psychiatric: She has a normal mood and affect. Her speech is normal and behavior is normal. Her mood appears not anxious.    ED Course  Procedures (including critical care time)  Medications  ibuprofen (ADVIL,MOTRIN)  200 MG tablet (not administered)  HYDROmorphone (DILAUDID) injection 1 mg (1 mg Intravenous Given 03/02/12 2246)  ondansetron (ZOFRAN) injection 4 mg (not administered)  potassium chloride 10 mEq in 100 mL IVPB (10 mEq Intravenous Given 03/02/12 2250)  potassium chloride SA (K-DUR,KLOR-CON) CR tablet 40 mEq (not administered)  HYDROmorphone (DILAUDID) injection 1 mg (1 mg Intravenous Given 03/02/12 2005)   Foley catheter was inserted.  22:05 Dr Thomasena Edis will come admit   Results for orders placed during the hospital encounter of 03/02/12  CBC      Component Value Range   WBC 14.0 (*) 4.0 - 10.5 (K/uL)   RBC 4.60  3.87 - 5.11 (MIL/uL)   Hemoglobin 13.0  12.0 - 15.0 (g/dL)   HCT 40.9  81.1 - 91.4 (%)   MCV 82.6  78.0 - 100.0 (fL)   MCH 28.3  26.0 - 34.0 (pg)   MCHC 34.2  30.0 - 36.0 (g/dL)   RDW 78.2  95.6 - 21.3 (%)   Platelets 224  150 - 400 (K/uL)  DIFFERENTIAL      Component Value Range   Neutrophils Relative 81 (*) 43 - 77 (%)   Lymphocytes Relative 12  12 - 46 (%)   Monocytes Relative 6  3 - 12 (%)   Eosinophils Relative 1  0 - 5 (%)   Basophils Relative 0  0 - 1 (%)   Neutro Abs 11.4 (*) 1.7 - 7.7 (K/uL)   Lymphs Abs 1.7  0.7 - 4.0 (K/uL)   Monocytes Absolute 0.8  0.1 - 1.0 (K/uL)   Eosinophils Absolute 0.1  0.0 - 0.7 (K/uL)   Basophils Absolute 0.0  0.0 - 0.1 (K/uL)   RBC Morphology ELLIPTOCYTES     WBC Morphology TOXIC GRANULATION    COMPREHENSIVE METABOLIC PANEL      Component Value Range   Sodium 142  135 - 145 (mEq/L)   Potassium 3.0 (*) 3.5 - 5.1 (mEq/L)   Chloride 108  96 - 112 (mEq/L)   CO2 23  19 - 32 (mEq/L)   Glucose, Bld 109 (*) 70 - 99 (mg/dL)   BUN 13  6 - 23 (mg/dL)   Creatinine, Ser 0.86  0.50 - 1.10 (mg/dL)   Calcium 8.9  8.4 - 57.8 (mg/dL)   Total Protein 6.5  6.0 - 8.3 (g/dL)   Albumin 4.1  3.5 - 5.2 (g/dL)   AST 28  0 - 37 (U/L)   ALT 15  0 - 35 (U/L)   Alkaline Phosphatase 46  39 - 117 (U/L)   Total Bilirubin 0.3  0.3 - 1.2 (mg/dL)   GFR calc  non Af Amer 87 (*) >90 (mL/min)   GFR calc Af Amer >90  >90 (mL/min)  APTT  Component Value Range   aPTT 30  24 - 37 (seconds)  PROTIME-INR      Component Value Range   Prothrombin Time 14.1  11.6 - 15.2 (seconds)   INR 1.07  0.00 - 1.49   SAMPLE TO BLOOD BANK      Component Value Range   Blood Bank Specimen SAMPLE AVAILABLE FOR TESTING     Sample Expiration 03/05/2012    URINALYSIS, ROUTINE W REFLEX MICROSCOPIC      Component Value Range   Color, Urine YELLOW  YELLOW    APPearance CLEAR  CLEAR    Specific Gravity, Urine 1.011  1.005 - 1.030    pH 7.5  5.0 - 8.0    Glucose, UA NEGATIVE  NEGATIVE (mg/dL)   Hgb urine dipstick NEGATIVE  NEGATIVE    Bilirubin Urine NEGATIVE  NEGATIVE    Ketones, ur TRACE (*) NEGATIVE (mg/dL)   Protein, ur NEGATIVE  NEGATIVE (mg/dL)   Urobilinogen, UA 0.2  0.0 - 1.0 (mg/dL)   Nitrite NEGATIVE  NEGATIVE    Leukocytes, UA NEGATIVE  NEGATIVE    Laboratory interpretation all normal except hypokalemia   Dg Hip Complete Left  03/02/2012  *RADIOLOGY REPORT*  Clinical Data: Pain.  Fall.  LEFT HIP - COMPLETE 2+ VIEW  Comparison: None.  Findings: The patient has an acute left subcapital hip fracture. No other acute bony or joint abnormality is identified.  IMPRESSION: Acute left subcapital hip fracture.  Per CMS PQRS reporting requirements (PQRS Measure 24): Given the patient's age of greater than 50 and the fracture site (hip, distal radius, or spine), the patient should be tested for osteoporosis using DXA, and the appropriate treatment considered based on the DXA results.  Original Report Authenticated By: Bernadene Bell. Maricela Curet, M.D.   Dg Chest Port 1 View  03/02/2012  *RADIOLOGY REPORT*  Clinical Data: Status post fall.  PORTABLE CHEST - 1 VIEW  Comparison: CT chest 06/10/2006.  Findings: Lungs are clear.  No pneumothorax or pleural fluid. Heart size normal.  No focal bony abnormality.  IMPRESSION: Negative chest.  Original Report Authenticated By: Bernadene Bell. Maricela Curet, M.D.    Date: 03/02/2012  Rate: 68  Rhythm: normal sinus rhythm  QRS Axis: left  Intervals: normal  ST/T Wave abnormalities: normal  Conduction Disutrbances:none  Narrative Interpretation:   Old EKG Reviewed: unchanged from 03/11/2004      1. Hip fracture, left   2. Fall   3. Hypokalemia    Plan admission  Devoria Albe, MD, FACEP    MDM          Ward Givens, MD 03/02/12 [id number]

## 2012-03-03 ENCOUNTER — Encounter (HOSPITAL_COMMUNITY): Payer: Self-pay | Admitting: *Deleted

## 2012-03-03 ENCOUNTER — Inpatient Hospital Stay (HOSPITAL_COMMUNITY): Payer: 59 | Admitting: Anesthesiology

## 2012-03-03 ENCOUNTER — Inpatient Hospital Stay (HOSPITAL_COMMUNITY): Payer: 59

## 2012-03-03 ENCOUNTER — Encounter (HOSPITAL_COMMUNITY)
Admission: EM | Disposition: A | Discharge status: Home Health | Payer: Self-pay | Source: Home / Self Care | Attending: Specialist

## 2012-03-03 ENCOUNTER — Encounter (HOSPITAL_COMMUNITY): Payer: Self-pay | Admitting: Anesthesiology

## 2012-03-03 HISTORY — PX: TOTAL HIP ARTHROPLASTY: SHX124

## 2012-03-03 LAB — SURGICAL PCR SCREEN
MRSA, PCR: NEGATIVE
Staphylococcus aureus: NEGATIVE

## 2012-03-03 LAB — BASIC METABOLIC PANEL
BUN: 14 mg/dL (ref 6–23)
CO2: 25 mEq/L (ref 19–32)
Calcium: 8.2 mg/dL — ABNORMAL LOW (ref 8.4–10.5)
Chloride: 110 mEq/L (ref 96–112)
Creatinine, Ser: 0.63 mg/dL (ref 0.50–1.10)
GFR calc Af Amer: >90 mL/min (ref >90)
GFR calc non Af Amer: >90 mL/min (ref >90)
Glucose, Bld: 142 mg/dL — ABNORMAL HIGH (ref 70–99)
Potassium: 4.4 mEq/L (ref 3.5–5.1)
Sodium: 142 mEq/L (ref 135–145)

## 2012-03-03 LAB — TYPE AND SCREEN
ABO/RH(D): O POS
Antibody Screen: NEGATIVE

## 2012-03-03 LAB — ABO/RH: ABO/RH(D): O POS

## 2012-03-03 SURGERY — ARTHROPLASTY, HIP, TOTAL,POSTERIOR APPROACH
Anesthesia: Spinal | Site: Hip | Laterality: Left | Wound class: Clean

## 2012-03-03 MED ORDER — MENTHOL 3 MG MT LOZG: 1.0000 | LOZENGE | OROMUCOSAL | Status: DC | PRN

## 2012-03-03 MED ORDER — DIPHENHYDRAMINE HCL 12.5 MG/5ML PO ELIX: 12.5000 mg | ORAL_SOLUTION | ORAL | Status: DC | PRN

## 2012-03-03 MED ORDER — PROPOFOL 10 MG/ML IV EMUL
INTRAVENOUS | Status: DC | PRN
  Administered 2012-03-03: 75 ug/kg/min via INTRAVENOUS

## 2012-03-03 MED ORDER — LACTATED RINGERS IV SOLN
INTRAVENOUS | Status: DC
  Administered 2012-03-03: 18:00:00 via INTRAVENOUS

## 2012-03-03 MED ORDER — LACTATED RINGERS IV SOLN: INTRAVENOUS | Status: DC

## 2012-03-03 MED ORDER — FERROUS SULFATE 325 (65 FE) MG PO TABS
325.0000 mg | ORAL_TABLET | Freq: Three times a day (TID) | ORAL | Status: DC
  Administered 2012-03-03 – 2012-03-05 (×5): 325 mg via ORAL
  Filled 2012-03-03 (×8): qty 1

## 2012-03-03 MED ORDER — ONDANSETRON HCL 4 MG PO TABS
4.0000 mg | ORAL_TABLET | Freq: Four times a day (QID) | ORAL | Status: DC | PRN
  Administered 2012-03-05: 4 mg via ORAL

## 2012-03-03 MED ORDER — HYDROCODONE-ACETAMINOPHEN 5-325 MG PO TABS
1.0000 | ORAL_TABLET | ORAL | Status: DC | PRN
  Administered 2012-03-03: 2 via ORAL
  Filled 2012-03-03: qty 2
  Filled 2012-03-03: qty 1

## 2012-03-03 MED ORDER — HETASTARCH-ELECTROLYTES 6 % IV SOLN
INTRAVENOUS | Status: DC | PRN
  Administered 2012-03-03: 16:00:00 via INTRAVENOUS

## 2012-03-03 MED ORDER — METOCLOPRAMIDE HCL 5 MG/ML IJ SOLN
5.0000 mg | Freq: Three times a day (TID) | INTRAMUSCULAR | Status: DC | PRN
  Administered 2012-03-03: 10 mg via INTRAVENOUS
  Filled 2012-03-03: qty 2

## 2012-03-03 MED ORDER — BUPIVACAINE IN DEXTROSE 0.75-8.25 % IT SOLN
INTRATHECAL | Status: DC | PRN
  Administered 2012-03-03: 1.5 mL via INTRATHECAL

## 2012-03-03 MED ORDER — METOCLOPRAMIDE HCL 10 MG PO TABS: 5.0000 mg | ORAL_TABLET | Freq: Three times a day (TID) | ORAL | Status: DC | PRN

## 2012-03-03 MED ORDER — FLUTICASONE PROPIONATE 50 MCG/ACT NA SUSP: 2.0000 | NASAL | Status: DC | PRN

## 2012-03-03 MED ORDER — 0.9 % SODIUM CHLORIDE (POUR BTL) OPTIME
TOPICAL | Status: DC | PRN
  Administered 2012-03-03: 1000 mL

## 2012-03-03 MED ORDER — METHOCARBAMOL 500 MG PO TABS
500.0000 mg | ORAL_TABLET | Freq: Four times a day (QID) | ORAL | Status: DC | PRN
  Administered 2012-03-03 – 2012-03-05 (×4): 500 mg via ORAL
  Filled 2012-03-03 (×4): qty 1

## 2012-03-03 MED ORDER — SODIUM CHLORIDE 0.9 % IV SOLN
INTRAVENOUS | Status: DC
  Administered 2012-03-04: 04:00:00 via INTRAVENOUS
  Filled 2012-03-03 (×6): qty 1000

## 2012-03-03 MED ORDER — DOCUSATE SODIUM 100 MG PO CAPS
100.0000 mg | ORAL_CAPSULE | Freq: Two times a day (BID) | ORAL | Status: DC
  Administered 2012-03-03 – 2012-03-04 (×3): 100 mg via ORAL

## 2012-03-03 MED ORDER — PROPOFOL 10 MG/ML IV BOLUS
INTRAVENOUS | Status: DC | PRN
  Administered 2012-03-03 (×2): 20 mg via INTRAVENOUS

## 2012-03-03 MED ORDER — LACTATED RINGERS IV SOLN
INTRAVENOUS | Status: DC | PRN
  Administered 2012-03-03 (×2): via INTRAVENOUS

## 2012-03-03 MED ORDER — EPHEDRINE SULFATE 50 MG/ML IJ SOLN
INTRAMUSCULAR | Status: DC | PRN
  Administered 2012-03-03 (×2): 5 mg via INTRAVENOUS

## 2012-03-03 MED ORDER — FLEET ENEMA 7-19 GM/118ML RE ENEM: 1.0000 | ENEMA | Freq: Once | RECTAL | Status: AC | PRN

## 2012-03-03 MED ORDER — FENTANYL CITRATE 0.05 MG/ML IJ SOLN
INTRAMUSCULAR | Status: DC | PRN
  Administered 2012-03-03 (×3): 50 ug via INTRAVENOUS

## 2012-03-03 MED ORDER — KETAMINE HCL 10 MG/ML IJ SOLN
INTRAMUSCULAR | Status: DC | PRN
  Administered 2012-03-03 (×2): 10 mg via INTRAVENOUS

## 2012-03-03 MED ORDER — ALUM & MAG HYDROXIDE-SIMETH 200-200-20 MG/5ML PO SUSP: 30.0000 mL | ORAL | Status: DC | PRN

## 2012-03-03 MED ORDER — POLYETHYLENE GLYCOL 3350 17 G PO PACK: 17.0000 g | PACK | Freq: Every day | ORAL | Status: DC | PRN

## 2012-03-03 MED ORDER — ONDANSETRON HCL 4 MG PO TABS: 4.0000 mg | ORAL_TABLET | Freq: Four times a day (QID) | ORAL | Status: DC | PRN

## 2012-03-03 MED ORDER — HYDROMORPHONE HCL PF 1 MG/ML IJ SOLN: 0.2500 mg | INTRAMUSCULAR | Status: DC | PRN

## 2012-03-03 MED ORDER — ACETAMINOPHEN 10 MG/ML IV SOLN
INTRAVENOUS | Status: DC | PRN
  Administered 2012-03-03: 1000 mg via INTRAVENOUS

## 2012-03-03 MED ORDER — ONDANSETRON HCL 4 MG/2ML IJ SOLN
4.0000 mg | Freq: Four times a day (QID) | INTRAMUSCULAR | Status: DC | PRN
  Administered 2012-03-03 – 2012-03-04 (×2): 4 mg via INTRAVENOUS
  Filled 2012-03-03: qty 2

## 2012-03-03 MED ORDER — ONDANSETRON HCL 4 MG/2ML IJ SOLN
4.0000 mg | Freq: Four times a day (QID) | INTRAMUSCULAR | Status: DC | PRN
  Administered 2012-03-03 (×2): 4 mg via INTRAVENOUS
  Filled 2012-03-03 (×2): qty 2

## 2012-03-03 MED ORDER — PHENOL 1.4 % MT LIQD: 1.0000 | OROMUCOSAL | Status: DC | PRN

## 2012-03-03 MED ORDER — CITALOPRAM HYDROBROMIDE 40 MG PO TABS
40.0000 mg | ORAL_TABLET | Freq: Every day | ORAL | Status: DC
  Administered 2012-03-03 – 2012-03-04 (×2): 40 mg via ORAL
  Filled 2012-03-03 (×3): qty 1

## 2012-03-03 MED ORDER — PANTOPRAZOLE SODIUM 40 MG PO TBEC
40.0000 mg | DELAYED_RELEASE_TABLET | Freq: Every day | ORAL | Status: DC
  Administered 2012-03-03 – 2012-03-04 (×2): 40 mg via ORAL
  Filled 2012-03-03 (×3): qty 1

## 2012-03-03 MED ORDER — METHOCARBAMOL 100 MG/ML IJ SOLN
500.0000 mg | Freq: Four times a day (QID) | INTRAVENOUS | Status: DC | PRN
  Filled 2012-03-03: qty 5

## 2012-03-03 MED ORDER — LACTATED RINGERS IV SOLN
INTRAVENOUS | Status: DC | PRN
  Administered 2012-03-03: 16:00:00 via INTRAVENOUS

## 2012-03-03 MED ORDER — CEFAZOLIN SODIUM 1-5 GM-% IV SOLN
INTRAVENOUS | Status: DC | PRN
  Administered 2012-03-03: 2 g via INTRAVENOUS

## 2012-03-03 MED ORDER — CEFAZOLIN SODIUM 1-5 GM-% IV SOLN
1.0000 g | Freq: Four times a day (QID) | INTRAVENOUS | Status: AC
  Administered 2012-03-03 – 2012-03-04 (×2): 1 g via INTRAVENOUS
  Filled 2012-03-03 (×2): qty 50

## 2012-03-03 MED ORDER — ENOXAPARIN SODIUM 40 MG/0.4ML ~~LOC~~ SOLN
40.0000 mg | SUBCUTANEOUS | Status: DC
  Administered 2012-03-04 – 2012-03-05 (×2): 40 mg via SUBCUTANEOUS
  Filled 2012-03-03 (×3): qty 0.4

## 2012-03-03 MED ORDER — SUMATRIPTAN SUCCINATE 100 MG PO TABS
100.0000 mg | ORAL_TABLET | ORAL | Status: DC | PRN
Start: 2012-03-03 — End: 2012-03-05

## 2012-03-03 MED ORDER — DIAZEPAM 5 MG PO TABS: 5.0000 mg | ORAL_TABLET | Freq: Two times a day (BID) | ORAL | Status: DC | PRN

## 2012-03-03 MED ORDER — HYDROMORPHONE HCL PF 1 MG/ML IJ SOLN
0.5000 mg | INTRAMUSCULAR | Status: DC | PRN
  Administered 2012-03-03: 1 mg via INTRAVENOUS
  Filled 2012-03-03: qty 1

## 2012-03-03 MED ORDER — HYDROMORPHONE HCL PF 1 MG/ML IJ SOLN
0.5000 mg | INTRAMUSCULAR | Status: DC | PRN
  Administered 2012-03-03 (×2): 1 mg via INTRAVENOUS
  Filled 2012-03-03 (×2): qty 1

## 2012-03-03 MED ORDER — ACETAMINOPHEN 325 MG PO TABS
650.0000 mg | ORAL_TABLET | Freq: Four times a day (QID) | ORAL | Status: DC | PRN
  Administered 2012-03-05: 650 mg via ORAL
  Filled 2012-03-03: qty 2

## 2012-03-03 MED ORDER — ACETAMINOPHEN 650 MG RE SUPP: 650.0000 mg | Freq: Four times a day (QID) | RECTAL | Status: DC | PRN

## 2012-03-03 MED ORDER — POTASSIUM CHLORIDE IN NACL 20-0.9 MEQ/L-% IV SOLN
INTRAVENOUS | Status: DC
  Administered 2012-03-03: 02:00:00 via INTRAVENOUS
  Filled 2012-03-03 (×2): qty 1000

## 2012-03-03 MED ORDER — MIDAZOLAM HCL 5 MG/5ML IJ SOLN
INTRAMUSCULAR | Status: DC | PRN
  Administered 2012-03-03 (×4): 1 mg via INTRAVENOUS

## 2012-03-03 MED ORDER — ZOLPIDEM TARTRATE 5 MG PO TABS: 5.0000 mg | ORAL_TABLET | Freq: Every evening | ORAL | Status: DC | PRN

## 2012-03-03 MED ORDER — METHOCARBAMOL 100 MG/ML IJ SOLN
500.0000 mg | Freq: Four times a day (QID) | INTRAMUSCULAR | Status: DC | PRN
  Filled 2012-03-03: qty 5

## 2012-03-03 SURGICAL SUPPLY — 43 items
BAG ZIPLOCK 12X15 (MISCELLANEOUS) IMPLANT
BLADE SAW SGTL 18X1.27X75 (BLADE) ×2 IMPLANT
CLOTH BEACON ORANGE TIMEOUT ST (SAFETY) ×2 IMPLANT
DERMABOND ADVANCED (GAUZE/BANDAGES/DRESSINGS) ×1
DERMABOND ADVANCED .7 DNX12 (GAUZE/BANDAGES/DRESSINGS) ×1 IMPLANT
DRAPE INCISE IOBAN 85X60 (DRAPES) ×2 IMPLANT
DRAPE ORTHO SPLIT 77X108 STRL (DRAPES) ×2
DRAPE POUCH INSTRU U-SHP 10X18 (DRAPES) ×2 IMPLANT
DRAPE SURG 17X11 SM STRL (DRAPES) ×2 IMPLANT
DRAPE SURG ORHT 6 SPLT 77X108 (DRAPES) ×2 IMPLANT
DRAPE U-SHAPE 47X51 STRL (DRAPES) ×2 IMPLANT
DRSG AQUACEL AG ADV 3.5X10 (GAUZE/BANDAGES/DRESSINGS) ×2 IMPLANT
DRSG TEGADERM 4X4.75 (GAUZE/BANDAGES/DRESSINGS) ×2 IMPLANT
DURAPREP 26ML APPLICATOR (WOUND CARE) ×2 IMPLANT
ELECT BLADE TIP CTD 4 INCH (ELECTRODE) ×2 IMPLANT
ELECT REM PT RETURN 9FT ADLT (ELECTROSURGICAL) ×2
ELECTRODE REM PT RTRN 9FT ADLT (ELECTROSURGICAL) ×1 IMPLANT
EVACUATOR 1/8 PVC DRAIN (DRAIN) ×2 IMPLANT
FACESHIELD LNG OPTICON STERILE (SAFETY) ×8 IMPLANT
GAUZE SPONGE 2X2 8PLY STRL LF (GAUZE/BANDAGES/DRESSINGS) ×1 IMPLANT
GLOVE BIOGEL PI IND STRL 7.5 (GLOVE) ×1 IMPLANT
GLOVE BIOGEL PI IND STRL 8 (GLOVE) ×1 IMPLANT
GLOVE BIOGEL PI INDICATOR 7.5 (GLOVE) ×1
GLOVE BIOGEL PI INDICATOR 8 (GLOVE) ×1
GLOVE ECLIPSE 8.0 STRL XLNG CF (GLOVE) ×2 IMPLANT
GLOVE ORTHO TXT STRL SZ7.5 (GLOVE) ×4 IMPLANT
GOWN BRE IMP PREV XXLGXLNG (GOWN DISPOSABLE) ×4 IMPLANT
GOWN STRL NON-REIN LRG LVL3 (GOWN DISPOSABLE) ×2 IMPLANT
KIT BASIN OR (CUSTOM PROCEDURE TRAY) ×2 IMPLANT
MANIFOLD NEPTUNE II (INSTRUMENTS) ×2 IMPLANT
NS IRRIG 1000ML POUR BTL (IV SOLUTION) ×2 IMPLANT
PACK TOTAL JOINT (CUSTOM PROCEDURE TRAY) ×2 IMPLANT
POSITIONER SURGICAL ARM (MISCELLANEOUS) ×2 IMPLANT
SPONGE GAUZE 2X2 STER 10/PKG (GAUZE/BANDAGES/DRESSINGS) ×1
SUCTION FRAZIER TIP 10 FR DISP (SUCTIONS) ×2 IMPLANT
SUT MNCRL AB 4-0 PS2 18 (SUTURE) ×2 IMPLANT
SUT VIC AB 1 CT1 36 (SUTURE) ×2 IMPLANT
SUT VIC AB 2-0 CT1 27 (SUTURE) ×2
SUT VIC AB 2-0 CT1 TAPERPNT 27 (SUTURE) ×2 IMPLANT
SUT VLOC 180 0 24IN GS25 (SUTURE) ×2 IMPLANT
TOWEL OR 17X26 10 PK STRL BLUE (TOWEL DISPOSABLE) ×6 IMPLANT
TRAY FOLEY CATH 14FRSI W/METER (CATHETERS) ×2 IMPLANT
WATER STERILE IRR 1500ML POUR (IV SOLUTION) ×2 IMPLANT
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
## 2012-03-03 NOTE — Anesthesia Preprocedure Evaluation (Signed)
Anesthesia Evaluation  Patient identified by MRN, date of birth, ID band Patient awake    Reviewed: Allergy & Precautions, H&P , NPO status , Patient's Chart, lab work & pertinent test results, reviewed documented beta blocker date and time   Airway Mallampati: II TM Distance: >3 FB Neck ROM: Full    Dental  (+) Teeth Intact and Dental Advisory Given   Pulmonary neg pulmonary ROS,  breath sounds clear to auscultation        Cardiovascular negative cardio ROS  Rhythm:Regular Rate:Normal  Denies cardiac symptoms   Neuro/Psych negative neurological ROS  negative psych ROS   GI/Hepatic negative GI ROS, Neg liver ROS,   Endo/Other  negative endocrine ROS  Renal/GU negative Renal ROS  negative genitourinary   Musculoskeletal   Abdominal   Peds negative pediatric ROS (+)  Hematology negative hematology ROS (+)   Anesthesia Other Findings   Reproductive/Obstetrics negative OB ROS                           Anesthesia Physical Anesthesia Plan  ASA: II  Anesthesia Plan: Spinal   Post-op Pain Management:    Induction: Intravenous  Airway Management Planned: Mask  Additional Equipment:   Intra-op Plan:   Post-operative Plan:   Informed Consent: I have reviewed the patients History and Physical, chart, labs and discussed the procedure including the risks, benefits and alternatives for the proposed anesthesia with the patient or authorized representative who has indicated his/her understanding and acceptance.   Dental advisory given  Plan Discussed with: CRNA and Surgeon  Anesthesia Plan Comments:         Anesthesia Quick Evaluation

## 2012-03-03 NOTE — Anesthesia Procedure Notes (Signed)
Spinal  Patient location during procedure: OR Start time: 03/03/2012 3:08 PM End time: 03/03/2012 3:18 PM Staffing Anesthesiologist: Lestine Box B Performed by: anesthesiologist  Preanesthetic Checklist Completed: patient identified, site marked, surgical consent, pre-op evaluation, timeout performed, IV checked, risks and benefits discussed and monitors and equipment checked Spinal Block Patient position: sitting Prep: Betadine and site prepped and draped Patient monitoring: heart rate, cardiac monitor, continuous pulse ox and blood pressure Approach: midline Location: L3-4 Injection technique: single-shot Needle Needle type: Quincke  Needle gauge: 25 G Needle length: 10 cm Needle insertion depth: 3 cm Assessment Sensory level: T6 Additional Notes [id number], 2014-09

## 2012-03-03 NOTE — Interval H&P Note (Signed)
History and Physical Interval Note:  03/03/2012 2:50 PM  Kim Daniel  has presented today for surgery, with the diagnosis of left femoral neck fracture  The various methods of treatment have been discussed with the patient and family. After consideration of risks, benefits and other options for treatment, the patient has consented to  Procedure(s) (LRB): LEFT TOTAL HIP ARTHROPLASTY (Left) as a surgical intervention .  The patients' history has been reviewed, patient examined, no change in status, stable for surgery.  I have reviewed the patients' chart and labs.  Questions were answered to the patient's satisfaction.     [name]

## 2012-03-03 NOTE — Transfer of Care (Signed)
Immediate Anesthesia Transfer of Care Note  Patient: Kim Daniel  Procedure(s) Performed: Procedure(s) (LRB): TOTAL HIP ARTHROPLASTY (Left)  Patient Location: PACU  Anesthesia Type: Spinal  Level of Consciousness: awake, sedated and patient cooperative  Airway & Oxygen Therapy: Patient Spontanous Breathing and Patient connected to face mask oxygen  Post-op Assessment: Report given to PACU RN, Post -op Vital signs reviewed and stable and SAB T10  Post vital signs: Reviewed and stable  Complications: No apparent anesthesia complications

## 2012-03-03 NOTE — Progress Notes (Signed)
Per Portable Equipment, no available Foot Pumps in house. SCD's brought as replacement. Kim Daniel, [place] 03/03/2012 4:52 AM

## 2012-03-03 NOTE — Anesthesia Postprocedure Evaluation (Signed)
  Anesthesia Post-op Note  Patient: Kim Daniel  Procedure(s) Performed: Procedure(s) (LRB): TOTAL HIP ARTHROPLASTY (Left)  Patient Location: PACU  Anesthesia Type: Spinal  Level of Consciousness: oriented and sedated  Airway and Oxygen Therapy: Patient Spontanous Breathing and Patient connected to nasal cannula oxygen  Post-op Pain: mild  Post-op Assessment: Post-op Vital signs reviewed, Patient's Cardiovascular Status Stable, Respiratory Function Stable and Patent Airway  Post-op Vital Signs: stable  Complications: No apparent anesthesia complications

## 2012-03-03 NOTE — Progress Notes (Signed)
Patient ID: Kim Daniel, female   DOB: 11/18/1945, 65 y.o.   MRN: [id number]  Left hip fracture Doing ok, a little nausea  Medical history reviewed  Plan is to go to OR today for left THR for management of left hip fracture due to her age and chances for future surgery Consent NPO 

## 2012-03-03 NOTE — H&P (View-Only) (Signed)
Patient ID: Kim Daniel, female   DOB: 10/25/1945, 66 y.o.   MRN: [id number]  Left hip fracture Doing ok, a little nausea  Medical history reviewed  Plan is to go to OR today for left THR for management of left hip fracture due to her age and chances for future surgery Consent NPO

## 2012-03-04 LAB — BASIC METABOLIC PANEL
BUN: 11 mg/dL (ref 6–23)
CO2: 24 mEq/L (ref 19–32)
Calcium: 8 mg/dL — ABNORMAL LOW (ref 8.4–10.5)
Chloride: 105 mEq/L (ref 96–112)
Creatinine, Ser: 0.52 mg/dL (ref 0.50–1.10)
GFR calc Af Amer: >90 mL/min (ref >90)
GFR calc non Af Amer: >90 mL/min (ref >90)
Glucose, Bld: 132 mg/dL — ABNORMAL HIGH (ref 70–99)
Potassium: 3.6 mEq/L (ref 3.5–5.1)
Sodium: 139 mEq/L (ref 135–145)

## 2012-03-04 LAB — CBC
HCT: 30.5 % — ABNORMAL LOW (ref 36.0–46.0)
Hemoglobin: 10 g/dL — ABNORMAL LOW (ref 12.0–15.0)
MCH: 28.4 pg (ref 26.0–34.0)
MCHC: 32.8 g/dL (ref 30.0–36.0)
MCV: 86.6 fL (ref 78.0–100.0)
Platelets: 161 10*3/uL (ref 150–400)
RBC: 3.52 MIL/uL — ABNORMAL LOW (ref 3.87–5.11)
RDW: 13.9 % (ref 11.5–15.5)
WBC: 13.2 10*3/uL — ABNORMAL HIGH (ref 4.0–10.5)

## 2012-03-04 MED ORDER — KETOROLAC TROMETHAMINE 15 MG/ML IJ SOLN
7.5000 mg | Freq: Four times a day (QID) | INTRAMUSCULAR | Status: DC
  Administered 2012-03-04 (×2): 7.5 mg via INTRAVENOUS
  Filled 2012-03-04 (×7): qty 1

## 2012-03-04 MED ORDER — TRAMADOL HCL 50 MG PO TABS
50.0000 mg | ORAL_TABLET | Freq: Four times a day (QID) | ORAL | Status: DC | PRN
  Administered 2012-03-04 (×2): 100 mg via ORAL
  Filled 2012-03-04 (×2): qty 2

## 2012-03-04 NOTE — Evaluation (Signed)
Physical Therapy Evaluation Patient Details Name: Kim Daniel MRN: [id number] DOB: 09-28-1946 Today's Date: 03/04/2012 Time: 7829-5621 PT Time Calculation (min): 28 min  PT Assessment / Plan / Recommendation Clinical Impression  66 y.o. female admitted to [facility] after a fall with left hip fx.  Now POD #1 s/p L THA (posterior) WBAT left leg.  She presents today with increased pain in her left leg, decreased strength in her left leg, decreased mobility, decreased knowlege of hip precautions, decreased independence in mobility.  She would benefit from acute PT to maximize her independence, functional mobility and safety so that she may be able to return home with her family's assistance at discharge.      PT Assessment  Patient needs continued PT services    Follow Up Recommendations  Home health PT;Supervision/Assistance - 24 hour    Barriers to Discharge  none      lEquipment Recommendations  None recommended by PT (OT recommending 3-in-1)    Recommendations for Other Services   none  Frequency 7X/week    Precautions / Restrictions Precautions Precautions: Posterior Hip Restrictions Weight Bearing Restrictions: Yes LLE Weight Bearing: Weight bearing as tolerated   Pertinent Vitals/Pain O2 sats on RA 98%, RN informed, O2 [place] left off.  Pain 6/10 left leg with mobility.  Pt. Premedicated.      Mobility  Bed Mobility Bed Mobility: Supine to Sit;Sitting - Scoot to Edge of Bed Supine to Sit: HOB elevated;With rails;3: Mod assist Sitting - Scoot to Edge of Bed: 4: Min assist Details for Bed Mobility Assistance: min assist to support trunk and left leg.   Transfers Transfers: Sit to Stand;Stand to Sit Sit to Stand: From elevated surface;With upper extremity assist;From bed;3: Mod assist Stand to Sit: 3: Mod assist;With upper extremity assist Details for Transfer Assistance: mod assist to support trunk over weak and painful left leg.  Verbal cues for safe hand placement.     Ambulation/Gait Ambulation/Gait Assistance: 3: Mod assist Ambulation Distance (Feet): 10 Feet Assistive device: Rolling walker Ambulation/Gait Assistance Details: chair to follow for safety.  mod assist to support trunk for balance and help weight shift when stepping with right foot.  Verbal cues for leg sequencing.  Gait Pattern: Step-to pattern;Decreased hip/knee flexion - left;Antalgic;Trunk flexed Gait velocity: significantly less than 1.8 ft/sec putting her at risk for recurrent falls.      Exercises Total Joint Exercises Ankle Circles/Pumps: AROM;Both;15 reps Quad Sets: AROM;Both;10 reps Heel Slides: AAROM;Left;10 reps Hip ABduction/ADduction: AAROM;Left;10 reps   PT Diagnosis: Difficulty walking;Abnormality of gait;Generalized weakness;Acute pain  PT Problem List: Decreased strength;Decreased activity tolerance;Decreased balance;Decreased mobility;Decreased knowledge of use of DME;Decreased knowledge of precautions;Pain PT Treatment Interventions: DME instruction;Gait training;Stair training;Functional mobility training;Therapeutic activities;Therapeutic exercise;Balance training;Patient/family education;Neuromuscular re-education   PT Goals Acute Rehab PT Goals PT Goal Formulation: With patient Time For Goal Achievement: 03/11/12 Potential to Achieve Goals: Good Pt will go Supine/Side to Sit: with modified independence;with HOB 0 degrees PT Goal: Supine/Side to Sit - Progress: Goal set today Pt will go Sit to Supine/Side: with modified independence;with HOB 0 degrees PT Goal: Sit to Supine/Side - Progress: Goal set today Pt will go Sit to Stand: with supervision PT Goal: Sit to Stand - Progress: Goal set today Pt will go Stand to Sit: with supervision PT Goal: Stand to Sit - Progress: Goal set today Pt will Transfer Bed to Chair/Chair to Bed: with supervision PT Transfer Goal: Bed to Chair/Chair to Bed - Progress: Goal set today Pt will Ambulate: >150 feet;with  supervision;with rolling walker PT Goal: Ambulate - Progress: Goal set today Pt will Go Up / Down Stairs: 3-5 stairs;with min assist;with rolling walker PT Goal: Up/Down Stairs - Progress: Goal set today Pt will Perform Home Exercise Program: with supervision, verbal cues required/provided PT Goal: Perform Home Exercise Program - Progress: Goal set today Additional Goals Additional Goal #1: Patient will be able to report 3/3 posterior hip precautions independently.   PT Goal: Additional Goal #1 - Progress: Goal set today  Visit Information  Last PT Received On: 03/04/12 Assistance Needed: +2 (chair to follow secondary to first time up, +1 next session) PT/OT Co-Evaluation/Treatment: Yes    Subjective Data  Subjective: Patient reports she caught the heel of her shoe in her dress while getting out of the car, fell and broke her left hip.   Patient Stated Goal: to get well enough to go home.     Prior Functioning  Home Living Lives With: Spouse Available Help at Discharge: Family Type of Home: House Home Access: Stairs to enter Secretary/administrator of Steps: 3 Entrance Stairs-Rails: None Home Layout: Two level;Bed/bath upstairs;1/2 bath on main level Alternate Level Stairs-Number of Steps: 7 stairs to get upstairs Alternate Level Stairs-Rails: Left Bathroom Shower/Tub: Tub/shower unit;Other (comment) (upstairs) Bathroom Toilet: Standard Home Adaptive Equipment: Shower chair with back;Straight cane;Walker - rolling Additional Comments: they are moving a bed downstairs for her, but she only has a 1/2 bath downstairs.  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  Communication Communication: No difficulties Dominant Hand: Right    Cognition  Overall Cognitive Status: Appears within functional limits for tasks assessed/performed Arousal/Alertness: Awake/alert Orientation Level:  Appears intact for tasks assessed Behavior During Session: [facility] for tasks performed Cognition - Other Comments: not specifically tested    Extremity/Trunk Assessment Right Lower Extremity Assessment RLE ROM/Strength/Tone: [facility] for tasks assessed Left Lower Extremity Assessment LLE ROM/Strength/Tone: Deficits LLE ROM/Strength/Tone Deficits: ROM within posterior hip precautions.  No guarding with ther ex.  Strength 3/5 knee, 4/5 ankle, 2+/5 hip   End of Session PT - End of Session Equipment Utilized During Treatment: Gait belt (chair to follow, RW.  ) Activity Tolerance: Patient limited by fatigue;Patient limited by pain Patient left: in chair;with call bell/phone within reach Nurse Communication: Mobility status (left O2 off secondary to good sats)   Lurena Joiner B. Aiyanna Awtrey, PT, DPT [phone] 03/04/2012, 12:33 PM

## 2012-03-04 NOTE — Progress Notes (Signed)
Utilization review completed.  

## 2012-03-04 NOTE — Progress Notes (Signed)
Physical Therapy Treatment Note  03/04/12 1508  PT Visit Information  Last PT Received On 03/04/12  Assistance Needed +1  PT Time Calculation  PT Start Time 1442  PT Stop Time 1507  PT Time Calculation (min) 25 min  Subjective Data  Subjective The patient reports that the doctor may let her go home tomorrow if she does well with therapy today.    Precautions  Precautions Posterior Hip  Precaution Booklet Issued Yes (comment)  Precaution Comments hip precaution sign in room and ther ex handout given  Restrictions  LLE Weight Bearing WBAT  Cognition  Overall Cognitive Status Appears within functional limits for tasks assessed/performed  Arousal/Alertness Awake/alert  Orientation Level Appears intact for tasks assessed  Behavior During Session [facility] for tasks performed  Bed Mobility  Sit to Supine 4: Min assist;HOB flat  Details for Bed Mobility Assistance min assist of left leg only  Transfers  Sit to Stand 4: Min guard;From elevated surface;With upper extremity assist;From chair/3-in-1  Stand to Sit 4: Min guard;With armrests;To bed;Without upper extremity assist  Details for Transfer Assistance min guard assist to support trunk during transitions for balance.  Verbal cues for safe hand and leg placement.    Ambulation/Gait  Ambulation/Gait Assistance 4: Min guard  Ambulation Distance (Feet) 50 Feet  Assistive device Rolling walker  Ambulation/Gait Assistance Details min guard assist for safety secondary to still first day and verbal cues needed to reinforce leg sequencing and safety while turning.    Gait Pattern Step-to pattern;Antalgic  Total Joint Exercises  Ankle Circles/Pumps AROM;Both;20 reps  Quad Sets AROM;Both;10 reps  Heel Slides AAROM;Left;10 reps  Hip ABduction/ADduction AAROM;Left;10 reps  Short Arc Quad AROM;Left;10 reps  Long Arc Quad AROM;Left;10 reps  PT - End of Session  Equipment Utilized During Treatment Gait belt (RW)  Activity Tolerance Patient limited  by fatigue;Patient limited by pain  Patient left in bed;with call bell/phone within reach;with family/visitor present (2 daughters and son in law)  PT - Assessment/Plan  Comments on Treatment Session 66 y.o. female admitted to [facility] s/p fall with L hip fracture. Now POD #1 (second session) L THA (posterior).  She is progressing well with gait and mobility.  She should be ready for discharge home with family's assist tomorrow after practicing the stairs if this is her and the surgeon's wishes.    PT Plan Discharge plan remains appropriate;Frequency remains appropriate  PT Frequency 7X/week  Follow Up Recommendations Home health PT;Supervision/Assistance - 24 hour  Equipment Recommended None recommended by PT (OT recommending a 3-in-1)  Acute Rehab PT Goals  Time For Goal Achievement 03/11/12  PT Goal: Sit to Supine/Side - Progress Progressing toward goal  PT Goal: Sit to Stand - Progress Progressing toward goal  PT Goal: Stand to Sit - Progress Progressing toward goal  PT Goal: Ambulate - Progress Progressing toward goal  PT Goal: Perform Home Exercise Program - Progress Progressing toward goal  Additional Goals  PT Goal: Additional Goal #1 - Progress Progressing toward goal    Raahil Ong B. Harini Dearmond, PT, DPT [phone]

## 2012-03-04 NOTE — Evaluation (Signed)
Occupational Therapy Evaluation Patient Details Name: Kim Daniel MRN: [id number] DOB: [birth year] Today's Date: 03/04/2012 Time: 9147-8295    OT Assessment / Plan / Recommendation Clinical Impression  Pt presents to OT s/p THR. Pt will benefit from skilled OT to increase I with ADL activity and education with hip precautions prior to DC home with family    OT Assessment  Patient needs continued OT Services    Follow Up Recommendations  Home health OT    Barriers to Discharge None family moving bed downstairs  Equipment Recommendations  3 in 1 bedside comode       Frequency  Min 2X/week    Precautions / Restrictions Precautions Precautions: Posterior Hip Restrictions Weight Bearing Restrictions: No       ADL  Eating/Feeding: Supervision/safety;Simulated Where Assessed - Eating/Feeding: Edge of bed Grooming: Simulated;Wash/dry face;Set up Where Assessed - Grooming: Unsupported sitting Upper Body Bathing: Simulated;Minimal assistance Where Assessed - Upper Body Bathing: Sitting, bed;Unsupported Lower Body Bathing: Simulated;Maximal assistance Where Assessed - Lower Body Bathing: Sit to stand from bed Upper Body Dressing: Simulated;Set up Where Assessed - Upper Body Dressing: Sitting, bed;Unsupported Lower Body Dressing: Simulated;Maximal assistance Where Assessed - Lower Body Dressing: Sit to stand from bed Toilet Transfer: Simulated;Other (comment);Moderate assistance (bed to chair transfer) Toilet Transfer Method: Ambulating Toileting - Clothing Manipulation: Moderate assistance Where Assessed - Toileting Clothing Manipulation: Standing Toileting - Hygiene: Moderate assistance Where Assessed - Toileting Hygiene: Standing Tub/Shower Transfer: Not assessed Tub/Shower Transfer Method: Not assessed ADL Comments: Husband will assist as able    OT Diagnosis: Generalized weakness;Acute pain  OT Problem List: Decreased strength;Pain OT Treatment Interventions:  Self-care/ADL training;DME and/or AE instruction;Patient/family education   OT Goals Acute Rehab OT Goals OT Goal Formulation: With patient ADL Goals Pt Will Perform Lower Body Dressing: with supervision;with adaptive equipment;Sit to stand from chair ADL Goal: Lower Body Dressing - Progress: Goal set today Pt Will Transfer to Toilet: with supervision;3-in-1;Comfort height toilet ADL Goal: Toilet Transfer - Progress: Goal set today Pt Will Perform Toileting - Clothing Manipulation: Standing;with supervision ADL Goal: Toileting - Clothing Manipulation - Progress: Goal set today Pt Will Perform Toileting - Hygiene: with supervision;Standing at 3-in-1/toilet ADL Goal: Toileting - Hygiene - Progress: Goal set today     Subjective Data  Subjective: I work full time- I will need to get back to work. I am an HR assistant Patient Stated Goal: I have a wedding in my yard in september   Prior Functioning  Home Living Lives With: Spouse Available Help at Discharge: Family Type of Home: House Home Access: Stairs to enter Secretary/administrator of Steps: 3 Entrance Stairs-Rails: None Home Layout: Two level;Bed/bath upstairs;1/2 bath on main level Alternate Level Stairs-Number of Steps: 7 stairs to get upstairs Alternate Level Stairs-Rails: Left Bathroom Shower/Tub: Tub/shower unit;Other (comment) (upstairs) Bathroom Toilet: Standard Home Adaptive Equipment: Shower chair with back;Straight cane;Walker - rolling 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  Communication Communication: No difficulties Dominant Hand: Right    Cognition  Overall Cognitive Status: Appears within functional limits for tasks assessed/performed Arousal/Alertness: Awake/alert Orientation Level: Appears intact for tasks assessed Behavior During Session: [facility] for tasks performed       Mobility Bed Mobility Bed  Mobility: Supine to Sit Supine to Sit: 3: Mod assist Transfers Transfers: Sit to Stand;Stand to Sit Sit to Stand: 3: Mod assist;With upper extremity assist Stand to Sit: 3: Mod assist;With upper extremity assist Details  for Transfer Assistance: walker         End of Session OT - End of Session Equipment Utilized During Treatment: Gait belt Activity Tolerance: Patient tolerated treatment well Patient left: in chair;with call bell/phone within reach   [facility], [place] Kung 03/04/2012, 11:31 AM

## 2012-03-04 NOTE — Progress Notes (Signed)
Patient ID: Kim Daniel, female   DOB: [birth year], 66 y.o.   MRN: [id number] Subjective: 1 Day Post-Op Procedure(s) (LRB): TOTAL HIP ARTHROPLASTY (Left) For femoral neck fracture    Patient reports pain as mild.  Better than it was yesterday, still with nausea  Objective:   VITALS:   Filed Vitals:   03/04/12 0434  BP: 100/67  Pulse: 84  Temp: 98.2 F (36.8 C)  Resp: 16    Incision: dressing C/D/I.  Hemovac drain removed no active drainage  LABS  Basename 03/04/12 0417 03/02/12 2055  HGB 10.0* 13.0  HCT 30.5* 38.0  WBC 13.2* 14.0*  PLT 161 224     Basename 03/04/12 0417 03/03/12 0513 03/02/12 2055  NA 139 142 142  K 3.6 4.4 3.0*  BUN 11 14 13   CREATININE 0.52 0.63 0.74  GLUCOSE 132* 142* 109*     Basename 03/02/12 2055  LABPT --  INR 1.07     Assessment/Plan: 1 Day Post-Op Procedure(s) (LRB): TOTAL HIP ARTHROPLASTY (Left)   Advance diet Up with therapy Discharge home with home health, will work towards D/C tomorrow or Wednesday depending on progress with therapy Change meds to assist with nausea

## 2012-03-05 LAB — CBC
HCT: 26.4 % — ABNORMAL LOW (ref 36.0–46.0)
Hemoglobin: 8.8 g/dL — ABNORMAL LOW (ref 12.0–15.0)
MCH: 28.4 pg (ref 26.0–34.0)
MCHC: 33.3 g/dL (ref 30.0–36.0)
MCV: 85.2 fL (ref 78.0–100.0)
Platelets: 158 10*3/uL (ref 150–400)
RBC: 3.1 MIL/uL — ABNORMAL LOW (ref 3.87–5.11)
RDW: 13.7 % (ref 11.5–15.5)
WBC: 10.6 10*3/uL — ABNORMAL HIGH (ref 4.0–10.5)

## 2012-03-05 LAB — BASIC METABOLIC PANEL
BUN: 9 mg/dL (ref 6–23)
CO2: 29 mEq/L (ref 19–32)
Calcium: 8 mg/dL — ABNORMAL LOW (ref 8.4–10.5)
Chloride: 101 mEq/L (ref 96–112)
Creatinine, Ser: 0.52 mg/dL (ref 0.50–1.10)
GFR calc Af Amer: >90 mL/min (ref >90)
GFR calc non Af Amer: >90 mL/min (ref >90)
Glucose, Bld: 115 mg/dL — ABNORMAL HIGH (ref 70–99)
Potassium: 3.5 mEq/L (ref 3.5–5.1)
Sodium: 135 mEq/L (ref 135–145)

## 2012-03-05 MED ORDER — ENOXAPARIN SODIUM 40 MG/0.4ML ~~LOC~~ SOLN: 40.0000 mg | SUBCUTANEOUS | Status: DC

## 2012-03-05 MED ORDER — TRAMADOL HCL 50 MG PO TABS: 50.0000 mg | ORAL_TABLET | Freq: Four times a day (QID) | ORAL | Status: AC | PRN

## 2012-03-05 MED ORDER — METHOCARBAMOL 500 MG PO TABS: 500.0000 mg | ORAL_TABLET | Freq: Four times a day (QID) | ORAL | Status: AC | PRN

## 2012-03-05 MED ORDER — ENOXAPARIN (LOVENOX) PATIENT EDUCATION KIT: 1.0000 | PACK | Freq: Once | Status: DC

## 2012-03-05 MED ORDER — ENOXAPARIN (LOVENOX) PATIENT EDUCATION KIT
PACK | Freq: Once | Status: AC
  Administered 2012-03-05: 10:00:00
  Filled 2012-03-05: qty 1

## 2012-03-05 MED ORDER — ACETAMINOPHEN 325 MG PO TABS: 650.0000 mg | ORAL_TABLET | Freq: Four times a day (QID) | ORAL | Status: AC | PRN

## 2012-03-05 MED ORDER — FERROUS SULFATE 325 (65 FE) MG PO TABS: 325.0000 mg | ORAL_TABLET | Freq: Three times a day (TID) | ORAL | Status: DC

## 2012-03-05 MED ORDER — POLYETHYLENE GLYCOL 3350 17 G PO PACK: 17.0000 g | PACK | Freq: Two times a day (BID) | ORAL | Status: AC

## 2012-03-05 MED ORDER — DSS 100 MG PO CAPS: 100.0000 mg | ORAL_CAPSULE | Freq: Two times a day (BID) | ORAL | Status: AC

## 2012-03-05 NOTE — Discharge Summary (Signed)
Physician Discharge Summary  Patient ID: Kim Daniel MRN: [id number] DOB/AGE: 1946-10-10 66 y.o.  Admit date: 03/02/2012 Discharge date: 03/05/2012  Procedures:  Procedure(s) (LRB): TOTAL HIP ARTHROPLASTY (Left)  Attending Physician:  Dr. Durene Romans   Admission Diagnoses: Left hip femoral neck fracture  Discharge Diagnoses:  S/P Left total Hip arthroplasty GERD  History of migraine headaches   Anxiety and depression   HPI: Patient is a 66 year old female who was getting out of her motor vehicle on 03/02/2012 when her of left foot got caught. This caused her to fall and land on her left hip. The patient had immediate pain and grinding sensation in her hip. EMS was called brought her to the ER where evaluation found she had a basilar femoral neck fracture. Orthopedics was consulted. Dr Charlann Boxer talked with to patient and planned on proceeding with surgery on 03/03/2012.   PCP: Kristian Covey, MD, MD   Discharged Condition: good  Hospital Course:  Patient underwent the above stated procedure on 03/03/2012. Patient tolerated the procedure well and brought to the recovery room in good condition and subsequently to the floor.  POD #1 BP: 100/67 ; Pulse: 84 ; Temp: 98.2 F (36.8 C) ; Resp: 16  Pt's foley was removed, as well as the hemovac drain removed. IV was changed to a saline lock. Patient reports pain as mild. Better than it was yesterday, still with nausea Incision: dressing C/D/I. Hemovac drain removed no active drainage  LABS  Basename  03/04/12 0417   HGB  10.0  HCT  30.5    POD #2  BP: 105/66 ; Pulse: 79 ; Temp: 98.5 F (36.9 C) ; Resp: 14  Patient reports pain as mild. Pain is well controlled. No events throughout the night.  Feels that she did well with PT yesterday. Ready to be discharged home. Neurovascular intact, dorsiflexion/plantar flexion intact, incision: dressing C/D/I, no cellulitis present and compartment soft. Denies any lightheadedness or dizziness.  Nausea had resolved.  LABS  Basename  03/05/12 0500  HGB  8.8  HCT  26.4    Discharge Exam: General appearance: alert, cooperative and no distress Extremities: Homans sign is negative, no sign of DVT, no edema, redness or tenderness in the calves or thighs and no ulcers, gangrene or trophic changes  Disposition: Home with follow up in 2 weeks  Follow-up Information    Follow up with OLIN,Derrisha Foos D in 2 weeks.   Contact information:   [facility] [address] [place] [address] [phone]          Discharge Orders    Future Orders Please Complete By Expires   Diet - low sodium heart healthy      Call MD / Call 911      Comments:   If you experience chest pain or shortness of breath, CALL 911 and be transported to the hospital emergency room.  If you develope a fever above 101 F, pus (white drainage) or increased drainage or redness at the wound, or calf pain, call your surgeon's office.   Discharge instructions      Comments:   Maintain surgical dressing for 8 days, then replace with gauze and tape. Keep the area dry and clean until follow up. Follow up in 2 weeks at [facility]. Call with any questions or concerns.  Call [phone] to make an appointment for 2 weeks after surgery.    Constipation Prevention      Comments:   Drink plenty of fluids.  Prune juice may be helpful.  You may use a stool softener, such as Colace (over the counter) 100 mg twice a day.  Use MiraLax (over the counter) for constipation as needed.   Increase activity slowly as tolerated      Weight Bearing as taught in Physical Therapy      Comments:   Use a walker or crutches as instructed.   Driving restrictions      Comments:   No driving for 4 weeks   Change dressing      Comments:   Maintain surgical dressing for 8 days, then replace with 4x4 guaze and tape. Keep the area dry and clean.   TED hose      Comments:   Use stockings  (TED hose) for 2 weeks on both leg(s).  You may remove them at night for sleeping.      Discharge Medication List as of 03/05/2012  8:54 AM    START taking these medications   Details  acetaminophen (TYLENOL) 325 MG tablet Take 2 tablets (650 mg total) by mouth every 6 (six) hours as needed (or Fever >/= 101)., Starting 03/05/2012, Until Wed 03/05/13, No Print    docusate sodium 100 MG CAPS Take 100 mg by mouth 2 (two) times daily., Starting 03/05/2012, Until Fri 03/15/12, No Print    enoxaparin (LOVENOX) 40 MG/0.4ML injection Inject 0.4 mLs (40 mg total) into the skin daily., Starting 03/05/2012, Until Discontinued, Print    enoxaparin (LOVENOX) KIT 1 kit by Does not apply route once., Starting 03/05/2012, Until Discontinued, No Print    ferrous sulfate 325 (65 FE) MG tablet Take 1 tablet (325 mg total) by mouth 3 (three) times daily after meals., Starting 03/05/2012, Until Wed 03/05/13, No Print    methocarbamol (ROBAXIN) 500 MG tablet Take 1 tablet (500 mg total) by mouth every 6 (six) hours as needed (muscle spasms)., Starting 03/05/2012, Until Fri 03/15/12, Print    polyethylene glycol (MIRALAX / GLYCOLAX) packet Take 17 g by mouth 2 (two) times daily., Starting 03/05/2012, Until Fri 03/08/12, No Print    traMADol (ULTRAM) 50 MG tablet Take 1-2 tablets (50-100 mg total) by mouth every 6 (six) hours as needed for pain., Starting 03/05/2012, Until Fri 03/15/12, Print      CONTINUE these medications which have NOT CHANGED   Details  citalopram (CELEXA) 40 MG tablet Take 1 tablet (40 mg total) by mouth daily., Starting 02/09/2012, Until Sat 02/08/13, Normal    diazepam (VALIUM) 5 MG tablet Take 1 tablet (5 mg total) by mouth every 12 (twelve) hours as needed., Starting 02/06/2012, Until Discontinued, Print    fluticasone (FLONASE) 50 MCG/ACT nasal spray Place 2 sprays into the nose as needed., Until Discontinued, Historical Med    pantoprazole (PROTONIX) 40 MG tablet Take 40 mg by mouth daily., Until  Discontinued, Historical Med    SUMAtriptan (IMITREX) 100 MG tablet 1 by mouth at the onset of migraine.  May repeat dose in one hour if headache not resolved, Normal      STOP taking these medications     ibuprofen (ADVIL,MOTRIN) 200 MG tablet Comments:  Reason for Stopping:            Signed: Anastasio Auerbach. Elley Harp   PAC  03/05/2012, 11:25 AM

## 2012-03-05 NOTE — Care Management Note (Signed)
    Page 1 of 2   03/05/2012     5:52:27 PM   CARE MANAGEMENT NOTE 03/05/2012  Patient:  Kim Daniel, Kim Daniel   Account Number:  [id number]  Date Initiated:  03/05/2012  Documentation initiated by:  Colleen Can  Subjective/Objective Assessment:   dx pt fell sustaining femoral neck fracture ; total hip replaqcemnt-right     Action/Plan:   CM spoke with patient. Plans are for patient to return to her home in [place] where spouse will be caregiver. She already has DME-RW. She wants HH agency that is in network   Anticipated DC Date:  03/05/2012   Anticipated DC Plan:  HOME W HOME HEALTH SERVICES  In-house referral  NA      DC Planning Services  CM consult      [facility] Choice  HOME HEALTH   Choice offered to / List presented to:  C-1 Patient   DME arranged  NA      DME agency  NA     HH arranged  HH-2 PT      [facility] agency  [facility]   Status of service:  Completed, signed off Medicare Important Message given?  NO (If response is "NO", the following Medicare IM given date fields will be blank) Date Medicare IM given:   Date Additional Medicare IM given:    Discharge Disposition:  HOME W HOME HEALTH SERVICES  Per UR Regulation:    If discussed at Long Length of Stay Meetings, dates discussed:    Comments:  03/05/2012 Raynelle Bring [facility] [phone] List of hh agencies placed on shadow chart. Pt chose agency out of [place]. [facility] called -unable to provide services; [facility] does not serve [place] area; [facility] is not in network; [facility] not not service area with [facility] services [facility] can service patient with the earliest date to start on Friday 03/08/2012. Notified care co-ordinator who spoke with attending doctor who approved services start date. Physical therapy had given patient PT homework. Pt understood the important of doing exercises daily as prescribed by physical therapy.Pt for discharge today, accompanied by spouse.

## 2012-03-05 NOTE — Progress Notes (Signed)
Discharge summary sent to payer through [facility]  

## 2012-03-05 NOTE — Progress Notes (Signed)
Subjective: 2 Days Post-Op Procedure(s) (LRB): TOTAL HIP ARTHROPLASTY (Left)   Patient reports pain as mild. Pain is well controlled. No events throughout the night. Ready to be discharged home.  Objective:   VITALS:   Filed Vitals:   03/05/12 0459  BP: 105/66  Pulse: 79  Temp: 98.5 F (36.9 C)  Resp: 14    Neurovascular intact Dorsiflexion/Plantar flexion intact Incision: dressing C/D/I No cellulitis present Compartment soft Did well with PT yesterday and ready to go home  LABS  Basename 03/05/12 0500 03/04/12 0417 03/02/12 2055  HGB 8.8* 10.0* 13.0  HCT 26.4* 30.5* 38.0  WBC 10.6* 13.2* 14.0*  PLT 158 161 224     Basename 03/05/12 0500 03/04/12 0417 03/03/12 0513  NA 135 139 142  K 3.5 3.6 4.4  BUN 9 11 14   CREATININE 0.52 0.52 0.63  GLUCOSE 115* 132* 142*     Assessment/Plan: 2 Days Post-Op Procedure(s) (LRB): TOTAL HIP ARTHROPLASTY (Left)   Up with therapy Discharge home with home health Follow up in 2 weeks at [facility].  Follow-up Information    Follow up with OLIN,Ysabella Babiarz D in 2 weeks.   Contact information:   [facility] [address], [address] [place] [address] [phone]          Anastasio Auerbach. Cartrell Bentsen   PAC  03/05/2012, 8:17 AM

## 2012-03-05 NOTE — Progress Notes (Signed)
Occupational Therapy Treatment Patient Details Name: Kim Daniel MRN: [id number] DOB: 16-May-1946 Today's Date: 03/05/2012 Time: 7829-5621 OT Time Calculation (min): 22 min  OT Assessment / Plan / Recommendation Comments on Treatment Session needs reinforcement with internal rotation during functional tasks    Follow Up Recommendations  Home health OT    Barriers to Discharge       Equipment Recommendations  None recommended by OT;Other (comment);None recommended by PT (pt has a toilet riser:  doesn't need 3:1)    Recommendations for Other Services    Frequency Min 2X/week   Plan      Precautions / Restrictions Precautions Precautions: Posterior Hip Restrictions Weight Bearing Restrictions: No LLE Weight Bearing: Weight bearing as tolerated   Pertinent Vitals/Pain     ADL  Lower Body Dressing: Performed;Minimal assistance (pants and socks with AE) Where Assessed - Lower Body Dressing: Sit to stand from bed Toilet Transfer: Simulated;Supervision/safety (min cues for internal rotation) Toileting - Clothing Manipulation: Simulated;Minimal assistance;Other (comment) (posterior LOB during dressing task) Where Assessed - Toileting Clothing Manipulation: Standing Toileting - Hygiene: Simulated;Minimal assistance (reinforced education to stand for hygiene) Where Assessed - Toileting Hygiene: Standing Ambulation Related to ADLs: posterior LOB during ADLS:  cued to keep one hand on walker with dressing.  Supervision transfer when both UEs on walker ADL Comments: has tub bench:  reviewed leaning backwards when bringing LLE over ledge.  She plans to have husband A with ADLS:  educated on AE and pt practiced    OT Diagnosis:    OT Problem List:   OT Treatment Interventions:     OT Goals ADL Goals Pt Will Perform Lower Body Dressing: with supervision;with adaptive equipment;Sit to stand from chair ADL Goal: Lower Body Dressing - Progress: Progressing toward goals Pt Will  Transfer to Toilet: with supervision;3-in-1;Comfort height toilet ADL Goal: Toilet Transfer - Progress: Progressing toward goals Pt Will Perform Toileting - Clothing Manipulation: Standing;with supervision ADL Goal: Toileting - Clothing Manipulation - Progress: Progressing toward goals Pt Will Perform Toileting - Hygiene: with supervision;Standing at 3-in-1/toilet ADL Goal: Toileting - Hygiene - Progress: Progressing toward goals  Visit Information  Last OT Received On: 03/05/12 Assistance Needed: +1    Subjective Data  Subjective: "I feel comfortable with everything, but I need to practice stairs"   Prior Functioning       Cognition  Overall Cognitive Status: Appears within functional limits for tasks assessed/performed    Mobility Bed Mobility Bed Mobility: Supine to Sit Supine to Sit: HOB elevated;4: Min assist Transfers Sit to Stand: 5: Supervision Stand to Sit: 5: Supervision   Exercises    Balance    End of Session OT - End of Session Activity Tolerance: Patient tolerated treatment well Patient left: in chair;with call bell/phone within reach   [facility] 03/05/2012, 9:19 AM Kim Daniel, OTR/L [phone] 03/05/2012

## 2012-03-05 NOTE — Progress Notes (Signed)
Physical Therapy Treatment Patient Details Name: Kim Daniel MRN: [id number] DOB: May 29, 1946 Today's Date: 03/05/2012 Time: 4098-1191 PT Time Calculation (min): 27 min  PT Assessment / Plan / Recommendation Comments on Treatment Session  Pt plans to D/C to home today with spouse, performed stair training and given handouts on THP, car transfers, stairs. No equipment needed, has all.    Follow Up Recommendations  Home health PT;Supervision/Assistance - 24 hour    Barriers to Discharge        Equipment Recommendations  Other (comment) (pt states she has all equipment)    Recommendations for Other Services    Frequency 7X/week   Plan Discharge plan remains appropriate    Precautions / Restrictions Precautions Precautions: Posterior Hip Restrictions Weight Bearing Restrictions: No LLE Weight Bearing: Weight bearing as tolerated   Pertinent Vitals/Pain Pt c/o 5/10 L hip pain, applied ICE and repositioned    Mobility  Bed Mobility Bed Mobility: Supine to Sit Supine to Sit: HOB elevated;4: Min assist Transfers Transfers: Sit to Stand;Stand to Sit Sit to Stand: 4: Min guard;From chair/3-in-1;With upper extremity assist Stand to Sit: 4: Min guard;To chair/3-in-1;With upper extremity assist Details for Transfer Assistance: 25% VC's to avoid hip flexion  Ambulation/Gait Ambulation/Gait Assistance: 4: Min guard Ambulation Distance (Feet): 10 Feet (5' X 2 to and from stairs) Ambulation/Gait Assistance Details: educated spouse on gait sequencing and safety with truns Gait Pattern: Step-to pattern;Decreased stance time - left Gait velocity: Pt c/o 5/10  L hip during gait and also increase c/o nausea on exersion....Marland Kitchenreported to RN Stairs: Yes Stairs Assistance: 4: Min assist Stairs Assistance Details (indicate cue type and reason): one step forward with RW and 4 steps backward with RW, all with spouse present and educated Stair Management Technique: No rails Number of Stairs: 5   (one step then 4 steps) Wheelchair Mobility Wheelchair Mobility: No     PT Goals Acute Rehab PT Goals PT Goal Formulation: With patient Potential to Achieve Goals: Good Pt will go Sit to Stand: with supervision PT Goal: Sit to Stand - Progress: Partly met Pt will go Stand to Sit: with supervision PT Goal: Stand to Sit - Progress: Partly met Pt will Ambulate: >150 feet;with supervision;with rolling walker PT Goal: Ambulate - Progress: Progressing toward goal Pt will Go Up / Down Stairs: 3-5 stairs;with min assist;with rolling walker PT Goal: Up/Down Stairs - Progress: Met Additional Goals Additional Goal #1: Patient will be able to report 3/3 posterior hip precautions independently PT Goal: Additional Goal #1 - Progress: Partly met  Visit Information  Last PT Received On: 03/05/12 Assistance Needed: +1    Subjective Data  Subjective: POD #2, pt plans to D/C to home today with spouse.  Tx session focused on negociating stairs and family/pt education on THP, car transfers, stairs and gait/transfer safety. Patient Stated Goal: n/a   Cognition  Overall Cognitive Status: Appears within functional limits for tasks assessed/performed    Balance   fair with RW  End of Session PT - End of Session Equipment Utilized During Treatment: Gait belt Activity Tolerance: Patient tolerated treatment well;Other (comment) (for POD # 2) Patient left: in chair;with call bell/phone within reach;with family/visitor present Nurse Communication: Mobility status;Other (comment) (pt c/o nausea)   Felecia Shelling  PTA [facility]  Acute  Rehab Pager     [phone]

## 2012-03-06 NOTE — Anesthesia Postprocedure Evaluation (Signed)
  Anesthesia Post-op Note  Patient: Kim Daniel  Procedure(s) Performed: Procedure(s) (LRB): TOTAL HIP ARTHROPLASTY (Left)  Patient Location: PACU  Anesthesia Type: General  Level of Consciousness: oriented and sedated  Airway and Oxygen Therapy: Patient Spontanous Breathing and Patient connected to nasal cannula oxygen  Post-op Pain: mild  Post-op Assessment: Post-op Vital signs reviewed, Patient's Cardiovascular Status Stable, Respiratory Function Stable and Patent Airway  Post-op Vital Signs: stable  Complications: No apparent anesthesia complications

## 2012-03-07 ENCOUNTER — Encounter (HOSPITAL_COMMUNITY): Payer: Self-pay | Admitting: Orthopedic Surgery

## 2012-03-11 ENCOUNTER — Other Ambulatory Visit: Payer: Self-pay | Admitting: *Deleted

## 2012-03-11 NOTE — Telephone Encounter (Signed)
If received 3 refills in April should not need refill yet.

## 2012-03-11 NOTE — Telephone Encounter (Signed)
Diazepam 5 mg, one tab tid prn anxiety.  Last filled at [facility] 02-06-12, #60 with 3 refills (prior Dr Scotty Court pt)

## 2012-03-12 NOTE — Telephone Encounter (Signed)
Pt informed, and she agreed, not sure why the request?  She did not call her pharmacy?

## 2012-03-13 LAB — HM MAMMOGRAPHY: HM Mammogram: NEGATIVE

## 2012-03-13 LAB — HM DEXA SCAN

## 2012-06-13 ENCOUNTER — Other Ambulatory Visit: Payer: Self-pay | Admitting: *Deleted

## 2012-06-13 NOTE — Telephone Encounter (Signed)
Patient is requesting a refill of Diazepam.  Last office visit 02/06/12 and last fill 05/09/12.  Is this okay to fill?

## 2012-06-13 NOTE — Telephone Encounter (Signed)
Refill for 6 months. She apparently has been on this chronically

## 2012-06-14 MED ORDER — DIAZEPAM 5 MG PO TABS: 5.0000 mg | ORAL_TABLET | Freq: Two times a day (BID) | ORAL | Status: DC | PRN

## 2012-06-19 ENCOUNTER — Telehealth: Payer: Self-pay | Admitting: Family Medicine

## 2012-06-19 MED ORDER — TRIAMCINOLONE ACETONIDE 0.1 % EX CREA: TOPICAL_CREAM | Freq: Two times a day (BID) | CUTANEOUS | Status: DC

## 2012-06-19 NOTE — Telephone Encounter (Signed)
Caller: Genee/Patient; Patient Name: Kim Daniel; PCP: Evelena Peat [facility]); Best Callback Phone Number: [phone]. [facility] calling for a new Script for some Triamcimolone 0.1 percent paste that she got from Dr Scotty Court appx 2 years ago. Reports she has 2 small mouth ulcers and this paste heals them quickly. She has used [employer] with minimal relief of discomfort. Per  Mouth Lesions, Painful pale yellow or white spots on lining of gums, Home care was given. Caller advised a note will be sent with this request. Pharmacy is [facility] at [phone].

## 2012-06-19 NOTE — Telephone Encounter (Signed)
Okay to refill? 

## 2012-06-19 NOTE — Telephone Encounter (Signed)
Pt established with you on 02/19/12, this ointment is not on her med list

## 2012-07-11 ENCOUNTER — Telehealth: Payer: Self-pay | Admitting: *Deleted

## 2012-07-11 NOTE — Telephone Encounter (Signed)
Request for valium received from [facility], our records show #60 with 5 refills was called to [facility] on 8/23, #60 with 5 refills. Pt did pick up #60 with 0 refills at [facility] on 8/20.  Pt also picked up #60 with 5 refills at [facility] drug.  I explained to pt she must use only one drug store for her controlled medication.  She voiced her understanding, I informed [facility] the will not be getting any refill on valium.  Controlled substance form will be sent to pt.

## 2012-11-20 DIAGNOSIS — IMO0002 Reserved for concepts with insufficient information to code with codable children: Secondary | ICD-10-CM | POA: Insufficient documentation

## 2012-11-20 DIAGNOSIS — H40059 Ocular hypertension, unspecified eye: Secondary | ICD-10-CM | POA: Insufficient documentation

## 2012-12-16 DIAGNOSIS — H04229 Epiphora due to insufficient drainage, unspecified lacrimal gland: Secondary | ICD-10-CM | POA: Insufficient documentation

## 2012-12-16 DIAGNOSIS — H40043 Steroid responder, bilateral: Secondary | ICD-10-CM | POA: Insufficient documentation

## 2012-12-19 ENCOUNTER — Telehealth: Payer: Self-pay | Admitting: *Deleted

## 2012-12-19 MED ORDER — DIAZEPAM 5 MG PO TABS: 5.0000 mg | ORAL_TABLET | Freq: Two times a day (BID) | ORAL | Status: DC | PRN

## 2012-12-19 NOTE — Telephone Encounter (Signed)
Diazepam refill request, every 12 hours prn last filled on 06-14-12, #60 with 5 refills (former Dr Scotty Court pt) Established with Dr Caryl Never on 03-03-12

## 2012-12-19 NOTE — Telephone Encounter (Signed)
Refill for one month. Needs office followup 

## 2013-01-27 DIAGNOSIS — H401131 Primary open-angle glaucoma, bilateral, mild stage: Secondary | ICD-10-CM | POA: Insufficient documentation

## 2013-02-05 ENCOUNTER — Telehealth: Payer: Self-pay | Admitting: Family Medicine

## 2013-02-05 ENCOUNTER — Other Ambulatory Visit: Payer: Self-pay | Admitting: *Deleted

## 2013-02-05 ENCOUNTER — Other Ambulatory Visit (INDEPENDENT_AMBULATORY_CARE_PROVIDER_SITE_OTHER): Payer: No Typology Code available for payment source

## 2013-02-05 DIAGNOSIS — Z0000 Encounter for general adult medical examination without abnormal findings: Secondary | ICD-10-CM

## 2013-02-05 DIAGNOSIS — R5383 Other fatigue: Secondary | ICD-10-CM

## 2013-02-05 DIAGNOSIS — R5381 Other malaise: Secondary | ICD-10-CM

## 2013-02-05 LAB — HEPATIC FUNCTION PANEL
ALT: 13 U/L (ref 0–35)
AST: 25 U/L (ref 0–37)
Albumin: 4.2 g/dL (ref 3.5–5.2)
Alkaline Phosphatase: 39 U/L (ref 39–117)
Bilirubin, Direct: 0.1 mg/dL (ref 0.0–0.3)
Total Bilirubin: 0.5 mg/dL (ref 0.3–1.2)
Total Protein: 6.9 g/dL (ref 6.0–8.3)

## 2013-02-05 LAB — CBC WITH DIFFERENTIAL/PLATELET
Basophils Absolute: 0.1 10*3/uL (ref 0.0–0.1)
Basophils Relative: 1.2 % (ref 0.0–3.0)
Eosinophils Absolute: 0.4 10*3/uL (ref 0.0–0.7)
Eosinophils Relative: 7.7 % — ABNORMAL HIGH (ref 0.0–5.0)
HCT: 40.5 % (ref 36.0–46.0)
Hemoglobin: 13.7 g/dL (ref 12.0–15.0)
Lymphocytes Relative: 30.5 % (ref 12.0–46.0)
Lymphs Abs: 1.7 10*3/uL (ref 0.7–4.0)
MCHC: 33.9 g/dL (ref 30.0–36.0)
MCV: 83.6 fl (ref 78.0–100.0)
Monocytes Absolute: 0.6 10*3/uL (ref 0.1–1.0)
Monocytes Relative: 11 % (ref 3.0–12.0)
Neutro Abs: 2.8 10*3/uL (ref 1.4–7.7)
Neutrophils Relative %: 49.6 % (ref 43.0–77.0)
Platelets: 222 10*3/uL (ref 150.0–400.0)
RBC: 4.85 Mil/uL (ref 3.87–5.11)
RDW: 14.4 % (ref 11.5–14.6)
WBC: 5.7 10*3/uL (ref 4.5–10.5)

## 2013-02-05 LAB — POCT URINALYSIS DIPSTICK
Bilirubin, UA: NEGATIVE
Blood, UA: NEGATIVE
Glucose, UA: NEGATIVE
Ketones, UA: NEGATIVE
Leukocytes, UA: NEGATIVE
Nitrite, UA: NEGATIVE
Protein, UA: NEGATIVE
Spec Grav, UA: 1.015
Urobilinogen, UA: 0.2
pH, UA: 7

## 2013-02-05 LAB — BASIC METABOLIC PANEL
BUN: 14 mg/dL (ref 6–23)
CO2: 27 mEq/L (ref 19–32)
Calcium: 9.4 mg/dL (ref 8.4–10.5)
Chloride: 106 mEq/L (ref 96–112)
Creatinine, Ser: 0.7 mg/dL (ref 0.4–1.2)
GFR: 85.78 mL/min (ref >60.00)
Glucose, Bld: 85 mg/dL (ref 70–99)
Potassium: 5 mEq/L (ref 3.5–5.1)
Sodium: 141 mEq/L (ref 135–145)

## 2013-02-05 LAB — LIPID PANEL
Cholesterol: 199 mg/dL (ref 0–200)
HDL: 64.5 mg/dL (ref >39.00)
LDL Cholesterol: 115 mg/dL — ABNORMAL HIGH (ref 0–99)
Total CHOL/HDL Ratio: 3
Triglycerides: 100 mg/dL (ref 0.0–149.0)
VLDL: 20 mg/dL (ref 0.0–40.0)

## 2013-02-05 LAB — TSH: TSH: 1.55 u[IU]/mL (ref 0.35–5.50)

## 2013-02-05 MED ORDER — PANTOPRAZOLE SODIUM 40 MG PO TBEC: 40.0000 mg | DELAYED_RELEASE_TABLET | Freq: Two times a day (BID) | ORAL | Status: DC

## 2013-02-05 MED ORDER — PANTOPRAZOLE SODIUM 40 MG PO TBEC: 40.0000 mg | DELAYED_RELEASE_TABLET | Freq: Every day | ORAL | Status: DC

## 2013-02-05 NOTE — Telephone Encounter (Signed)
Lelon Mast called and stated that she received 2 copies of pt's pantoprazole (PROTONIX) 40 MG tablet. They both have different instructions on them, and she'd like to know which one to fill. Does the pt take it once a day or twice? Please assist.

## 2013-02-05 NOTE — Telephone Encounter (Signed)
PT came in today and stated that she needed a refill of her Pantoprazole (Protonix) 40mg  tablet. She takes 1 tablet PO BID. Please assist.

## 2013-02-05 NOTE — Telephone Encounter (Signed)
Pharmacist informed once daily

## 2013-02-11 ENCOUNTER — Encounter: Payer: Self-pay | Admitting: Family Medicine

## 2013-02-11 ENCOUNTER — Ambulatory Visit (INDEPENDENT_AMBULATORY_CARE_PROVIDER_SITE_OTHER): Payer: No Typology Code available for payment source | Admitting: Family Medicine

## 2013-02-11 VITALS — BP 110/74 | HR 72 | Temp 99.1°F | Resp 12 | Ht 64.5 in | Wt 130.0 lb

## 2013-02-11 DIAGNOSIS — Z0000 Encounter for general adult medical examination without abnormal findings: Secondary | ICD-10-CM

## 2013-02-11 DIAGNOSIS — Z23 Encounter for immunization: Secondary | ICD-10-CM

## 2013-02-11 MED ORDER — CITALOPRAM HYDROBROMIDE 40 MG PO TABS: 40.0000 mg | ORAL_TABLET | Freq: Every day | ORAL | Status: DC

## 2013-02-11 NOTE — Patient Instructions (Addendum)
Continue regular supplementation with calcium 1200 mg daily and vitamin D [phone] international units daily

## 2013-02-11 NOTE — Progress Notes (Signed)
Subjective:    Patient ID: Kim Daniel, female    DOB: [birth year], 67 y.o.   MRN: [id number]  HPI Patient here for complete physical. She does see gynecologist regularly. She is getting Pap smears through GYN as well as mammograms and DEXA scans. Immunizations reviewed. We cannot confirm last Pneumovax but she thinks she had one before age 22. She has mammograms in May.  Patient had fall last May with left hip fracture in an adult with left total hip replacement. She has done well since that time.  Requesting refills of medication today including Protonix for reflux symptoms and citalopram for recurrent depression. Those conditions are stable. She takes regular calcium and vitamin D supplementation. She had some anemia following her surgery and that has corrected with iron replacement  Past Medical History  Diagnosis Date  . GERD (gastroesophageal reflux disease)   . History of migraine headaches   . Anxiety and depression   . Anxiety   . Depression    Past Surgical History  Procedure Laterality Date  . Back surgery    . Abdominal hysterectomy    . Tubal ligation    . Total hip arthroplasty  03/03/2012    Procedure: TOTAL HIP ARTHROPLASTY;  Surgeon: [name], MD;  Location: [facility] ORS;  Service: Orthopedics;  Laterality: Left;    reports that she has quit smoking. She has never used smokeless tobacco. She reports that she does not drink alcohol or use illicit drugs. family history includes Cancer (age of onset: 31) in her father and Cirrhosis in her mother. No Known Allergies    Review of Systems  Constitutional: Negative for fever, activity change, appetite change, fatigue and unexpected weight change.  HENT: Negative for hearing loss, ear pain, sore throat and trouble swallowing.   Eyes: Negative for visual disturbance.  Respiratory: Negative for cough and shortness of breath.   Cardiovascular: Negative for chest pain and palpitations.  Gastrointestinal: Negative for  abdominal pain, diarrhea, constipation and blood in stool.  Genitourinary: Negative for dysuria and hematuria.  Musculoskeletal: Negative for myalgias, back pain and arthralgias.  Skin: Negative for rash.  Neurological: Negative for dizziness, syncope and headaches.  Hematological: Negative for adenopathy.  Psychiatric/Behavioral: Negative for confusion and dysphoric mood.       Objective:   Physical Exam  Constitutional: She is oriented to person, place, and time. She appears well-developed and well-nourished.  HENT:  Head: Normocephalic and atraumatic.  Eyes: EOM are normal. Pupils are equal, round, and reactive to light.  Neck: Normal range of motion. Neck supple. No thyromegaly present.  Cardiovascular: Normal rate, regular rhythm and normal heart sounds.   No murmur heard. Pulmonary/Chest: Breath sounds normal. No respiratory distress. She has no wheezes. She has no rales.  Abdominal: Soft. Bowel sounds are normal. She exhibits no distension and no mass. There is no tenderness. There is no rebound and no guarding.  Genitourinary:  Per gyn  Musculoskeletal: Normal range of motion. She exhibits no edema.  Lymphadenopathy:    She has no cervical adenopathy.  Neurological: She is alert and oriented to person, place, and time. She displays normal reflexes. No cranial nerve deficit.  Skin: No rash noted.  Psychiatric: She has a normal mood and affect. Her behavior is normal. Judgment and thought content normal.          Assessment & Plan:  Complete physical. Labs reviewed with patient and all favorable. Continue regular calcium and vitamin D supplementation. Pneumovax given. She'll continue GYN followup  and regular mammograms. Colonoscopy is up-to-date

## 2013-03-10 ENCOUNTER — Telehealth: Payer: Self-pay | Admitting: *Deleted

## 2013-03-10 MED ORDER — DIAZEPAM 5 MG PO TABS: 5.0000 mg | ORAL_TABLET | Freq: Two times a day (BID) | ORAL | Status: DC | PRN

## 2013-03-10 NOTE — Telephone Encounter (Signed)
Refill once 

## 2013-03-10 NOTE — Telephone Encounter (Signed)
Diazepam last filled 12-19-12, #60 with 02-11-2013. Last OV 02-11-13

## 2013-04-15 ENCOUNTER — Other Ambulatory Visit: Payer: Self-pay | Admitting: *Deleted

## 2013-04-15 MED ORDER — FLUTICASONE PROPIONATE 50 MCG/ACT NA SUSP: 2.0000 | Freq: Every day | NASAL | Status: DC

## 2013-04-15 NOTE — Telephone Encounter (Signed)
Rx request to pharmacy/SLS  

## 2013-05-07 ENCOUNTER — Telehealth: Payer: Self-pay

## 2013-05-07 NOTE — Telephone Encounter (Signed)
Diazepam 5mg  tab Take 1 tablet by mouth every 12 hours as needed  Last refill on 03/10/13 #60 no refills Last seen on 02/11/13  [facility]

## 2013-05-08 MED ORDER — DIAZEPAM 5 MG PO TABS: 5.0000 mg | ORAL_TABLET | Freq: Two times a day (BID) | ORAL | Status: DC | PRN

## 2013-05-08 NOTE — Telephone Encounter (Signed)
Refill for 3 months. 

## 2013-05-08 NOTE — Telephone Encounter (Signed)
Phoned in.

## 2013-05-26 ENCOUNTER — Other Ambulatory Visit: Payer: Self-pay | Admitting: Family Medicine

## 2013-06-25 ENCOUNTER — Telehealth: Payer: Self-pay | Admitting: Family Medicine

## 2013-06-25 MED ORDER — ONDANSETRON HCL 8 MG PO TABS: 8.0000 mg | ORAL_TABLET | Freq: Three times a day (TID) | ORAL | Status: DC | PRN

## 2013-06-25 NOTE — Telephone Encounter (Signed)
Zofran 8 mg po q 8 hours PRN nausea and vomiting, disp #15 with no refill.

## 2013-06-25 NOTE — Telephone Encounter (Signed)
Was last seen on 02/11/13

## 2013-06-25 NOTE — Telephone Encounter (Signed)
Rx sent to pharmacy and Pt is aware 

## 2013-06-25 NOTE — Telephone Encounter (Signed)
Kim Daniel/patient Phone [phone] Called to request Zofran be ordered for upcoming trip to [place].  Leaving 07/26/13 for 12 day trip.  Would like to have medication on hand in case it is needed.  [facility] [phone].

## 2013-07-03 ENCOUNTER — Ambulatory Visit (INDEPENDENT_AMBULATORY_CARE_PROVIDER_SITE_OTHER): Payer: No Typology Code available for payment source

## 2013-07-03 DIAGNOSIS — Z23 Encounter for immunization: Secondary | ICD-10-CM

## 2013-07-21 ENCOUNTER — Other Ambulatory Visit: Payer: Self-pay | Admitting: Family Medicine

## 2013-07-21 ENCOUNTER — Telehealth: Payer: Self-pay | Admitting: Family Medicine

## 2013-07-21 NOTE — Telephone Encounter (Signed)
Pt called and stated that her pharmacy requested her triamcinolone cream (KENALOG) 0.1 % [Pharmacy Med Name: TRIAMCINOLON CRE 0.1%]. They are going to send another request today but it will be for PASTE instead of cream. She states that the cream doesn't work efficiently because she's using it orally. She wanted to ensure that it was not denied as being a duplicate. Please assist.

## 2013-07-23 NOTE — Telephone Encounter (Signed)
Where did she get this rx?  I don't see where we prescribed steroid cream or paste.

## 2013-07-23 NOTE — Telephone Encounter (Signed)
Is it okay to change to the paste instead of cream

## 2013-07-23 NOTE — Telephone Encounter (Signed)
Pt is calling again to inquire about the status of this correction. Please assist.

## 2013-07-24 MED ORDER — TRIAMCINOLONE ACETONIDE 0.1 % MT PSTE: 1.0000 "application " | PASTE | Freq: Two times a day (BID) | OROMUCOSAL | Status: DC

## 2013-07-24 NOTE — Telephone Encounter (Signed)
She was giving the cream on 06/19/12 from this office

## 2013-07-24 NOTE — Telephone Encounter (Signed)
RX changed to paste and the RX sent to pharmacy

## 2013-07-24 NOTE — Telephone Encounter (Signed)
OK to change to paste instead of cream

## 2013-12-08 ENCOUNTER — Other Ambulatory Visit: Payer: Self-pay | Admitting: Family Medicine

## 2013-12-08 ENCOUNTER — Telehealth: Payer: Self-pay | Admitting: Family Medicine

## 2013-12-08 NOTE — Telephone Encounter (Signed)
Last visit 02/11/13 Last refill 05/08/13 #60 3 refill

## 2013-12-08 NOTE — Telephone Encounter (Signed)
Pt is needing new rx diazepam (VALIUM) 5 MG tablet, sent to [phone], pt states she is out of meds.

## 2013-12-09 NOTE — Telephone Encounter (Signed)
Refill times three. 

## 2013-12-10 MED ORDER — DIAZEPAM 5 MG PO TABS: 5.0000 mg | ORAL_TABLET | Freq: Two times a day (BID) | ORAL | Status: DC | PRN

## 2013-12-10 MED ORDER — CITALOPRAM HYDROBROMIDE 40 MG PO TABS: 40.0000 mg | ORAL_TABLET | Freq: Every day | ORAL | Status: DC

## 2013-12-10 NOTE — Telephone Encounter (Signed)
Pt following up on valium request.  Pt also request citalopram (CELEXA) 40 MG tablet Pt will be out before her appt for cpe in April. [facility]

## 2013-12-10 NOTE — Telephone Encounter (Signed)
Valium called in and Celexa sent to pharmacy

## 2014-02-06 ENCOUNTER — Other Ambulatory Visit (INDEPENDENT_AMBULATORY_CARE_PROVIDER_SITE_OTHER): Payer: BC Managed Care – PPO

## 2014-02-06 ENCOUNTER — Encounter: Payer: Self-pay | Admitting: *Deleted

## 2014-02-06 DIAGNOSIS — Z0000 Encounter for general adult medical examination without abnormal findings: Secondary | ICD-10-CM

## 2014-02-06 LAB — POCT URINALYSIS DIPSTICK
Bilirubin, UA: NEGATIVE
Blood, UA: NEGATIVE
Glucose, UA: NEGATIVE
Ketones, UA: NEGATIVE
Leukocytes, UA: NEGATIVE
Nitrite, UA: NEGATIVE
Protein, UA: NEGATIVE
Spec Grav, UA: 1.015
Urobilinogen, UA: 0.2
pH, UA: 7

## 2014-02-06 LAB — HEPATIC FUNCTION PANEL
ALT: 14 U/L (ref 0–35)
AST: 28 U/L (ref 0–37)
Albumin: 4.1 g/dL (ref 3.5–5.2)
Alkaline Phosphatase: 38 U/L — ABNORMAL LOW (ref 39–117)
Bilirubin, Direct: 0.1 mg/dL (ref 0.0–0.3)
Total Bilirubin: 0.8 mg/dL (ref 0.3–1.2)
Total Protein: 6.8 g/dL (ref 6.0–8.3)

## 2014-02-06 LAB — CBC WITH DIFFERENTIAL/PLATELET
Basophils Absolute: 0.1 10*3/uL (ref 0.0–0.1)
Basophils Relative: 1.8 % (ref 0.0–3.0)
Eosinophils Absolute: 0.3 10*3/uL (ref 0.0–0.7)
Eosinophils Relative: 5.5 % — ABNORMAL HIGH (ref 0.0–5.0)
HCT: 40.9 % (ref 36.0–46.0)
Hemoglobin: 13.5 g/dL (ref 12.0–15.0)
Lymphocytes Relative: 34.7 % (ref 12.0–46.0)
Lymphs Abs: 1.8 10*3/uL (ref 0.7–4.0)
MCHC: 33.1 g/dL (ref 30.0–36.0)
MCV: 87 fl (ref 78.0–100.0)
Monocytes Absolute: 0.5 10*3/uL (ref 0.1–1.0)
Monocytes Relative: 10.6 % (ref 3.0–12.0)
Neutro Abs: 2.5 10*3/uL (ref 1.4–7.7)
Neutrophils Relative %: 47.4 % (ref 43.0–77.0)
Platelets: 219 10*3/uL (ref 150.0–400.0)
RBC: 4.7 Mil/uL (ref 3.87–5.11)
RDW: 13.4 % (ref 11.5–14.6)
WBC: 5.2 10*3/uL (ref 4.5–10.5)

## 2014-02-06 LAB — LIPID PANEL
Cholesterol: 200 mg/dL (ref 0–200)
HDL: 67.9 mg/dL (ref >39.00)
LDL Cholesterol: 118 mg/dL — ABNORMAL HIGH (ref 0–99)
Total CHOL/HDL Ratio: 3
Triglycerides: 70 mg/dL (ref 0.0–149.0)
VLDL: 14 mg/dL (ref 0.0–40.0)

## 2014-02-06 LAB — BASIC METABOLIC PANEL
BUN: 16 mg/dL (ref 6–23)
CO2: 28 mEq/L (ref 19–32)
Calcium: 9.3 mg/dL (ref 8.4–10.5)
Chloride: 106 mEq/L (ref 96–112)
Creatinine, Ser: 0.7 mg/dL (ref 0.4–1.2)
GFR: 86.91 mL/min (ref >60.00)
Glucose, Bld: 75 mg/dL (ref 70–99)
Potassium: 3.9 mEq/L (ref 3.5–5.1)
Sodium: 140 mEq/L (ref 135–145)

## 2014-02-06 LAB — TSH: TSH: 2.17 u[IU]/mL (ref 0.35–5.50)

## 2014-02-13 ENCOUNTER — Encounter: Payer: No Typology Code available for payment source | Admitting: Family Medicine

## 2014-02-27 ENCOUNTER — Encounter: Payer: No Typology Code available for payment source | Admitting: Family Medicine

## 2014-03-05 ENCOUNTER — Encounter: Payer: Self-pay | Admitting: Family Medicine

## 2014-03-05 ENCOUNTER — Ambulatory Visit (INDEPENDENT_AMBULATORY_CARE_PROVIDER_SITE_OTHER): Payer: BC Managed Care – PPO | Admitting: Family Medicine

## 2014-03-05 VITALS — BP 106/68 | HR 78 | Temp 98.0°F | Ht 64.0 in | Wt 132.0 lb

## 2014-03-05 DIAGNOSIS — Z0000 Encounter for general adult medical examination without abnormal findings: Secondary | ICD-10-CM

## 2014-03-05 DIAGNOSIS — K219 Gastro-esophageal reflux disease without esophagitis: Secondary | ICD-10-CM

## 2014-03-05 NOTE — Progress Notes (Signed)
Pre visit review using our clinic review tool, if applicable. No additional management support is needed unless otherwise documented below in the visit note. 

## 2014-03-05 NOTE — Patient Instructions (Signed)

## 2014-03-05 NOTE — Progress Notes (Signed)
   Subjective:    Patient ID: Kim ChuteBarbara F Daniel, female    DOB: [birth year], 68 y.o.   MRN: [id number]  HPI Patient here for complete physical. She sees gynecologist regularly. She has history of some chronic anxiety and takes citalopram regularly. She had colonoscopy 2011. Previous shingles vaccine. She had 23 valent pneumococcal vaccine last year. Recent mammogram in April reportedly normal. She walks for exercise. She quit smoking about 15 years ago.  She has some intermittent constipation issues and has follow up with gastroenterologist tomorrow. History of osteopenia. She plans to get repeat DEXA scan next year. Takes calcium and vitamin D  Past Medical History  Diagnosis Date  . GERD (gastroesophageal reflux disease)   . History of migraine headaches   . Anxiety and depression   . Anxiety   . Depression    Past Surgical History  Procedure Laterality Date  . Back surgery    . Abdominal hysterectomy    . Tubal ligation    . Total hip arthroplasty  03/03/2012    Procedure: TOTAL HIP ARTHROPLASTY;  Surgeon: Shelda PalMatthew D Olin, MD;  Location: [facility];  Service: Orthopedics;  Laterality: Left;    reports that she has quit smoking. She has never used smokeless tobacco. She reports that she does not drink alcohol or use illicit drugs. family history includes Cancer (age of onset: 4858) in her father; Cirrhosis in her mother. No Known Allergies    Review of Systems  Constitutional: Negative for fever, activity change, appetite change, fatigue and unexpected weight change.  HENT: Negative for ear pain, hearing loss, sore throat and trouble swallowing.   Eyes: Negative for visual disturbance.  Respiratory: Negative for cough and shortness of breath.   Cardiovascular: Negative for chest pain and palpitations.  Gastrointestinal: Positive for constipation. Negative for abdominal pain, diarrhea and blood in stool.  Endocrine: Negative for polydipsia and polyuria.  Genitourinary: Negative for  dysuria and hematuria.  Musculoskeletal: Negative for arthralgias, back pain and myalgias.  Skin: Negative for rash.  Neurological: Negative for dizziness, syncope and headaches.  Hematological: Negative for adenopathy.  Psychiatric/Behavioral: Negative for confusion and dysphoric mood.       Objective:   Physical Exam  Constitutional: She is oriented to person, place, and time. She appears well-developed and well-nourished.  HENT:  Head: Normocephalic and atraumatic.  Eyes: EOM are normal. Pupils are equal, round, and reactive to light.  Neck: Normal range of motion. Neck supple. No thyromegaly present.  Cardiovascular: Normal rate, regular rhythm and normal heart sounds.   No murmur heard. Pulmonary/Chest: Breath sounds normal. No respiratory distress. She has no wheezes. She has no rales.  Abdominal: Soft. Bowel sounds are normal. She exhibits no distension and no mass. There is no tenderness. There is no rebound and no guarding.  Genitourinary:  Per gyn  Musculoskeletal: Normal range of motion. She exhibits no edema.  Lymphadenopathy:    She has no cervical adenopathy.  Neurological: She is alert and oriented to person, place, and time. She displays normal reflexes. No cranial nerve deficit.  Skin: No rash noted.  Psychiatric: She has a normal mood and affect. Her behavior is normal. Judgment and thought content normal.          Assessment & Plan:  Health maintenance. We discussed Prevnar 13 and she wishes to wait until next year (or later this Fall) for that. She is seeing gynecology regularly. Her colonoscopy is up-to-date. Continue regular weightbearing exercise. Continue yearly flu vaccine

## 2014-03-25 ENCOUNTER — Encounter: Payer: Self-pay | Admitting: Family Medicine

## 2014-03-30 ENCOUNTER — Other Ambulatory Visit: Payer: Self-pay | Admitting: Family Medicine

## 2014-05-15 ENCOUNTER — Ambulatory Visit (INDEPENDENT_AMBULATORY_CARE_PROVIDER_SITE_OTHER): Payer: BC Managed Care – PPO | Admitting: Family Medicine

## 2014-05-15 ENCOUNTER — Encounter: Payer: Self-pay | Admitting: Family Medicine

## 2014-05-15 VITALS — BP 104/68 | HR 68 | Temp 98.4°F | Wt 132.0 lb

## 2014-05-15 DIAGNOSIS — R109 Unspecified abdominal pain: Secondary | ICD-10-CM

## 2014-05-15 DIAGNOSIS — R1031 Right lower quadrant pain: Secondary | ICD-10-CM

## 2014-05-15 NOTE — Progress Notes (Signed)
   Subjective:    Patient ID: Kim Daniel, female    DOB: 12-13-1945, 68 y.o.   MRN: [id number]  Abdominal Pain Associated symptoms include constipation. Pertinent negatives include no diarrhea, dysuria, fever, nausea or vomiting.   Patient's relates 3 months of some intermittent right lower quadrant abdominal pain. She's had couple weeks of some right flank pain. She has chronic constipation and sometimes goes 7-10 days without bowel movement. She had colonoscopy 5 years ago. No reported polyps or history of diverticular changes. Last bowel movement was yesterday. She frequently has bloating and lower abdominal soreness. She's had some right flank pain but no dysuria. No fevers or chills. No nausea or vomiting. No bloody stools.  Has occasionally taken probiotic which provides some symptom relief. Recent TSH normal. She's tried stool softeners without much improvement. Has never tried MiraLax. Occasionally uses suppository with improvement in constipation. No recent appetite or weight changes  Past Medical History  Diagnosis Date  . GERD (gastroesophageal reflux disease)   . History of migraine headaches   . Anxiety and depression   . Anxiety   . Depression    Past Surgical History  Procedure Laterality Date  . Back surgery    . Abdominal hysterectomy    . Tubal ligation    . Total hip arthroplasty  03/03/2012    Procedure: TOTAL HIP ARTHROPLASTY;  Surgeon: Shelda PalMatthew D Olin, MD;  Location: [facility];  Service: Orthopedics;  Laterality: Left;    reports that she has quit smoking. She has never used smokeless tobacco. She reports that she does not drink alcohol or use illicit drugs. family history includes Cancer (age of onset: 8258) in her father; Cirrhosis in her mother. No Known Allergies    Review of Systems  Constitutional: Negative for fever, chills, appetite change and unexpected weight change.  Cardiovascular: Negative for chest pain.  Gastrointestinal: Positive for abdominal  pain and constipation. Negative for nausea, vomiting, diarrhea and blood in stool.  Genitourinary: Positive for flank pain. Negative for dysuria.       Objective:   Physical Exam  Constitutional: She appears well-developed and well-nourished.  HENT:  Right Ear: External ear normal.  Left Ear: External ear normal.  Mouth/Throat: Oropharynx is clear and moist.  Neck: Neck supple. No thyromegaly present.  Cardiovascular: Normal rate and regular rhythm.   Pulmonary/Chest: Effort normal and breath sounds normal. No respiratory distress. She has no wheezes. She has no rales.  Abdominal: Soft. Bowel sounds are normal. She exhibits no distension and no mass. There is tenderness. There is no rebound and no guarding.  Tender right lower quadrant to deep palpation. Minimally tender. No guarding or rebound.          Assessment & Plan:  Patient presents with several month history of right lower quadrant abdominal pain and some more recent right flank pain. No worrisome symptoms such as appetite or weight change.  Check labs of urinalysis and CBC. She has severe constipation and willl try MiraLax and measures to reduce constipation. Consider GI referral if not improving with the above

## 2014-05-15 NOTE — Progress Notes (Signed)
Pre visit review using our clinic review tool, if applicable. No additional management support is needed unless otherwise documented below in the visit note. 

## 2014-05-15 NOTE — Patient Instructions (Signed)
Constipation Constipation is when a person has fewer than three bowel movements a week, has difficulty having a bowel movement, or has stools that are dry, hard, or larger than normal. As people grow older, constipation is more common. If you try to fix constipation with medicines that make you have a bowel movement (laxatives), the problem may get worse. Long-term laxative use may cause the muscles of the colon to become weak. A low-fiber diet, not taking in enough fluids, and taking certain medicines may make constipation worse.  CAUSES   Certain medicines, such as antidepressants, pain medicine, iron supplements, antacids, and water pills.   Certain diseases, such as diabetes, irritable bowel syndrome (IBS), thyroid disease, or depression.   Not drinking enough water.   Not eating enough fiber-rich foods.   Stress or travel.   Lack of physical activity or exercise.   Ignoring the urge to have a bowel movement.   Using laxatives too much.  SIGNS AND SYMPTOMS   Having fewer than three bowel movements a week.   Straining to have a bowel movement.   Having stools that are hard, dry, or larger than normal.   Feeling full or bloated.   Pain in the lower abdomen.   Not feeling relief after having a bowel movement.  DIAGNOSIS  Your health care provider will take a medical history and perform a physical exam. Further testing may be done for severe constipation. Some tests may include:  A barium enema X-ray to examine your rectum, colon, and, sometimes, your small intestine.   A sigmoidoscopy to examine your lower colon.   A colonoscopy to examine your entire colon. TREATMENT  Treatment will depend on the severity of your constipation and what is causing it. Some dietary treatments include drinking more fluids and eating more fiber-rich foods. Lifestyle treatments may include regular exercise. If these diet and lifestyle recommendations do not help, your health care  provider may recommend taking over-the-counter laxative medicines to help you have bowel movements. Prescription medicines may be prescribed if over-the-counter medicines do not work.  HOME CARE INSTRUCTIONS   Eat foods that have a lot of fiber, such as fruits, vegetables, whole grains, and beans.  Limit foods high in fat and processed sugars, such as french fries, hamburgers, cookies, candies, and soda.   A fiber supplement may be added to your diet if you cannot get enough fiber from foods.   Drink enough fluids to keep your urine clear or pale yellow.   Exercise regularly or as directed by your health care provider.   Go to the restroom when you have the urge to go. Do not hold it.   Only take over-the-counter or prescription medicines as directed by your health care provider. Do not take other medicines for constipation without talking to your health care provider first.  SEEK IMMEDIATE MEDICAL CARE IF:   You have bright red blood in your stool.   Your constipation lasts for more than 4 days or gets worse.   You have abdominal or rectal pain.   You have thin, pencil-like stools.   You have unexplained weight loss. MAKE SURE YOU:   Understand these instructions.  Will watch your condition.  Will get help right away if you are not doing well or get worse. Document Released: 07/07/2004 Document Revised: 10/14/2013 Document Reviewed: 07/21/2013  Consider Miralax -may take daily but consider regimen of twice weekly. [employer] Patient Information 2015 [employer]. This information is not intended to replace advice given to  you by your health care provider. Make sure you discuss any questions you have with your health care provider.  

## 2014-06-05 ENCOUNTER — Ambulatory Visit (INDEPENDENT_AMBULATORY_CARE_PROVIDER_SITE_OTHER): Payer: BC Managed Care – PPO | Admitting: Family Medicine

## 2014-06-05 ENCOUNTER — Encounter: Payer: Self-pay | Admitting: Family Medicine

## 2014-06-05 VITALS — BP 100/64 | HR 81 | Wt 132.0 lb

## 2014-06-05 DIAGNOSIS — R1031 Right lower quadrant pain: Secondary | ICD-10-CM

## 2014-06-05 DIAGNOSIS — K5900 Constipation, unspecified: Secondary | ICD-10-CM

## 2014-06-05 NOTE — Progress Notes (Signed)
Pre visit review using our clinic review tool, if applicable. No additional management support is needed unless otherwise documented below in the visit note. 

## 2014-06-05 NOTE — Progress Notes (Signed)
   Subjective:    Patient ID: Kim Daniel, femalRaechel Daniel    DOB: 24-Jun-1946, 68 y.o.   MRN: [id number]  HPI   Patient seen with persistent constipation issues. Refer to prior note. She had colonoscopy 5 years ago. She's not taking anticholinergic medications. She tried MiraLax and had minimal improvement with that. She is still going about 6-7 days frequently between bowel movements. Occasionally has some mild leakage in between. No evidence for impaction. She is drinking plenty of fluids. She stays quite active with walking. She's not any fevers or chills. No bloody stool issues. High fiber diet.     Past Medical History  Diagnosis Date  . GERD (gastroesophageal reflux disease)   . History of migraine headaches   . Anxiety and depression   . Anxiety   . Depression    Past Surgical History  Procedure Laterality Date  . Back surgery    . Abdominal hysterectomy    . Tubal ligation    . Total hip arthroplasty  03/03/2012    Procedure: TOTAL HIP ARTHROPLASTY;  Surgeon: Shelda PalMatthew D Olin, MD;  Location: [facility];  Service: Orthopedics;  Laterality: Left;    reports that she has quit smoking. She has never used smokeless tobacco. She reports that she does not drink alcohol or use illicit drugs. family history includes Cancer (age of onset: 6658) in her father; Cirrhosis in her mother. No Known Allergies    Review of Systems  Constitutional: Negative for appetite change and unexpected weight change.  Respiratory: Negative for cough and shortness of breath.   Cardiovascular: Negative for chest pain.  Gastrointestinal: Positive for constipation. Negative for abdominal pain and blood in stool.       Objective:   Physical Exam  Constitutional: She appears well-developed and well-nourished.  Cardiovascular: Normal rate and regular rhythm.   Abdominal: Soft. Bowel sounds are normal. She exhibits no distension and no mass. There is no tenderness. There is no rebound and no guarding.         Assessment & Plan:  Constipation and right lower quadrant abdominal pain. Patient does not have any clear exacerbating factors-for constipation. Recent TSH normal. She's tried MiraLax with little improvement. Try Senokot S. Set up GI referral.

## 2014-06-05 NOTE — Patient Instructions (Signed)
Try Senokot for constipation We will call you with GI referral.

## 2014-06-16 ENCOUNTER — Other Ambulatory Visit: Payer: Self-pay | Admitting: Family Medicine

## 2014-07-14 ENCOUNTER — Other Ambulatory Visit: Payer: Self-pay | Admitting: Family Medicine

## 2014-07-24 ENCOUNTER — Ambulatory Visit: Payer: BC Managed Care – PPO | Admitting: Family Medicine

## 2014-08-05 ENCOUNTER — Other Ambulatory Visit: Payer: Self-pay | Admitting: Family Medicine

## 2014-08-05 ENCOUNTER — Encounter: Payer: Self-pay | Admitting: Family Medicine

## 2014-08-05 ENCOUNTER — Ambulatory Visit (INDEPENDENT_AMBULATORY_CARE_PROVIDER_SITE_OTHER): Payer: BC Managed Care – PPO | Admitting: Family Medicine

## 2014-08-05 VITALS — BP 106/70 | HR 74 | Temp 98.3°F | Wt 134.0 lb

## 2014-08-05 DIAGNOSIS — R109 Unspecified abdominal pain: Secondary | ICD-10-CM

## 2014-08-05 DIAGNOSIS — R1031 Right lower quadrant pain: Secondary | ICD-10-CM

## 2014-08-05 NOTE — Telephone Encounter (Signed)
Last visit 06/05/14  Last refill 12/10/13 #60 3 refill

## 2014-08-05 NOTE — Progress Notes (Signed)
   Subjective:    Patient ID: Kim Daniel, female    DOB: 07-10-1946, 68 y.o.   MRN: [id number]  Flank Pain Associated symptoms include abdominal pain. Pertinent negatives include no chest pain, dysuria or fever.   Patient seen with persistent right flank and right lower quadrant pain. Duration of a least a few months. Refer to prior notes. She has frequent constipation and saw a gastroenterologist and was placed on Linzess.  Her constipation issues have improved. This has not relieved her right lower quadrant pains. She had cough now 5 years ago. Her pain is more of a dull achy quality somewhat off and on and 8/10 in intensity at its worst. Tends to be worse late in the day. She denies any appetite or weight changes. She had taken nonsteroidals without relief. Heat helps somewhat.  She's had previous bilateral tubal ligation and partial hysterectomy. No recent abdominal imaging. Her pain radiates somewhat toward the back region. She's never had any dysuria or any gross hematuria. No fevers or chills. No skin rashes. No clear exacerbating factors. Previous urinalysis and CBC were unremarkable.  She does have hx of probable IBS.  Past Medical History  Diagnosis Date  . GERD (gastroesophageal reflux disease)   . History of migraine headaches   . Anxiety and depression   . Anxiety   . Depression    Past Surgical History  Procedure Laterality Date  . Back surgery    . Abdominal hysterectomy    . Tubal ligation    . Total hip arthroplasty  03/03/2012    Procedure: TOTAL HIP ARTHROPLASTY;  Surgeon: Shelda PalMatthew D Olin, MD;  Location: [facility];  Service: Orthopedics;  Laterality: Left;    reports that she has quit smoking. She has never used smokeless tobacco. She reports that she does not drink alcohol or use illicit drugs. family history includes Cancer (age of onset: 7258) in her father; Cirrhosis in her mother. No Known Allergies    Review of Systems  Constitutional: Negative for fever,  chills, appetite change and unexpected weight change.  Cardiovascular: Negative for chest pain.  Gastrointestinal: Positive for abdominal pain. Negative for nausea, vomiting, diarrhea and blood in stool.  Genitourinary: Positive for flank pain. Negative for dysuria.  Skin: Negative for rash.  Hematological: Negative for adenopathy.       Objective:   Physical Exam  Constitutional: She appears well-developed and well-nourished.  Cardiovascular: Normal rate and regular rhythm.   Pulmonary/Chest: Effort normal and breath sounds normal. No respiratory distress. She has no wheezes. She has no rales.  Abdominal: Soft. Bowel sounds are normal. She exhibits no distension and no mass. There is tenderness. There is no rebound and no guarding.  Patient tender to palpation right lower quadrant.          Assessment & Plan:  Persistent right lower quadrant abdominal pain/flank pain. Patient's had recent colonoscopy within 5 years. She denies any red flags such as appetite or weight changes. Symptoms not relieved with treatment of constipation issues. Recommend CT abdomen and pelvis with contrast to further evaluate. Consider possible neuropathic pain if the above negative, though she does have persistent tenderness to palpation.

## 2014-08-05 NOTE — Progress Notes (Signed)
Pre visit review using our clinic review tool, if applicable. No additional management support is needed unless otherwise documented below in the visit note. 

## 2014-08-06 ENCOUNTER — Other Ambulatory Visit (INDEPENDENT_AMBULATORY_CARE_PROVIDER_SITE_OTHER): Payer: BC Managed Care – PPO

## 2014-08-06 DIAGNOSIS — R109 Unspecified abdominal pain: Secondary | ICD-10-CM

## 2014-08-06 LAB — BASIC METABOLIC PANEL
BUN: 13 mg/dL (ref 6–23)
CO2: 25 mEq/L (ref 19–32)
Calcium: 9.4 mg/dL (ref 8.4–10.5)
Chloride: 105 mEq/L (ref 96–112)
Creatinine, Ser: 0.8 mg/dL (ref 0.4–1.2)
GFR: 80.23 mL/min (ref >60.00)
Glucose, Bld: 79 mg/dL (ref 70–99)
Potassium: 4.4 mEq/L (ref 3.5–5.1)
Sodium: 139 mEq/L (ref 135–145)

## 2014-08-06 NOTE — Telephone Encounter (Signed)
Refill times three. Try to avoid regular use, if possible.

## 2014-08-07 ENCOUNTER — Ambulatory Visit (INDEPENDENT_AMBULATORY_CARE_PROVIDER_SITE_OTHER)
Admission: RE | Admit: 2014-08-07 | Discharge: 2014-08-07 | Disposition: A | Discharge status: Home / Self Care | Payer: BC Managed Care – PPO | Source: Ambulatory Visit | Attending: Family Medicine | Admitting: Family Medicine

## 2014-08-07 DIAGNOSIS — R1031 Right lower quadrant pain: Secondary | ICD-10-CM

## 2014-08-07 MED ORDER — IOHEXOL 300 MG/ML  SOLN
100.0000 mL | Freq: Once | INTRAMUSCULAR | Status: AC | PRN
  Administered 2014-08-07: 100 mL via INTRAVENOUS

## 2014-08-10 ENCOUNTER — Telehealth: Payer: Self-pay | Admitting: Family Medicine

## 2014-08-10 DIAGNOSIS — K838 Other specified diseases of biliary tract: Secondary | ICD-10-CM

## 2014-08-10 DIAGNOSIS — N1330 Unspecified hydronephrosis: Secondary | ICD-10-CM

## 2014-08-10 NOTE — Telephone Encounter (Signed)
Pt needs ct scan results from Friday.

## 2014-08-11 ENCOUNTER — Other Ambulatory Visit: Payer: Self-pay | Admitting: Family Medicine

## 2014-08-11 NOTE — Telephone Encounter (Signed)
Pt notified of CT results.  ?CBD dilation- but she has had no RUQ pain.  Severe UPJ obstruction which appears to be chronic Will check hepatic panel and lipase and set up urology referral.

## 2014-08-19 ENCOUNTER — Other Ambulatory Visit (INDEPENDENT_AMBULATORY_CARE_PROVIDER_SITE_OTHER): Payer: BC Managed Care – PPO

## 2014-08-19 DIAGNOSIS — K838 Other specified diseases of biliary tract: Secondary | ICD-10-CM

## 2014-08-19 LAB — HEPATIC FUNCTION PANEL
ALT: 14 U/L (ref 0–35)
AST: 26 U/L (ref 0–37)
Albumin: 4.1 g/dL (ref 3.5–5.2)
Alkaline Phosphatase: 47 U/L (ref 39–117)
Bilirubin, Direct: 0 mg/dL (ref 0.0–0.3)
Total Bilirubin: 0.9 mg/dL (ref 0.2–1.2)
Total Protein: 7.3 g/dL (ref 6.0–8.3)

## 2014-08-19 LAB — LIPASE: Lipase: 37 U/L (ref 11.0–59.0)

## 2014-09-04 ENCOUNTER — Other Ambulatory Visit (HOSPITAL_COMMUNITY): Payer: Self-pay | Admitting: Urology

## 2014-09-04 DIAGNOSIS — N1330 Unspecified hydronephrosis: Secondary | ICD-10-CM

## 2014-09-04 DIAGNOSIS — N135 Crossing vessel and stricture of ureter without hydronephrosis: Secondary | ICD-10-CM

## 2014-09-11 ENCOUNTER — Ambulatory Visit (HOSPITAL_COMMUNITY)
Admission: RE | Admit: 2014-09-11 | Discharge: 2014-09-11 | Disposition: A | Discharge status: Home / Self Care | Payer: BC Managed Care – PPO | Source: Ambulatory Visit | Attending: Urology | Admitting: Urology

## 2014-09-11 DIAGNOSIS — N2889 Other specified disorders of kidney and ureter: Secondary | ICD-10-CM | POA: Diagnosis not present

## 2014-09-11 DIAGNOSIS — N1330 Unspecified hydronephrosis: Secondary | ICD-10-CM

## 2014-09-11 DIAGNOSIS — N135 Crossing vessel and stricture of ureter without hydronephrosis: Secondary | ICD-10-CM

## 2014-09-11 MED ORDER — TECHNETIUM TC 99M MERTIATIDE
15.2000 | Freq: Once | INTRAVENOUS | Status: AC | PRN
  Administered 2014-09-11: 15 via INTRAVENOUS

## 2014-09-11 MED ORDER — FUROSEMIDE 10 MG/ML IJ SOLN
30.5000 mg | Freq: Once | INTRAMUSCULAR | Status: AC
  Administered 2014-09-11: 31 mg via INTRAVENOUS
  Filled 2014-09-11: qty 4

## 2014-09-16 ENCOUNTER — Other Ambulatory Visit: Payer: Self-pay | Admitting: Urology

## 2014-09-28 ENCOUNTER — Encounter (HOSPITAL_BASED_OUTPATIENT_CLINIC_OR_DEPARTMENT_OTHER): Payer: Self-pay | Admitting: *Deleted

## 2014-09-29 ENCOUNTER — Encounter (HOSPITAL_BASED_OUTPATIENT_CLINIC_OR_DEPARTMENT_OTHER): Payer: Self-pay | Admitting: *Deleted

## 2014-10-01 ENCOUNTER — Encounter (HOSPITAL_BASED_OUTPATIENT_CLINIC_OR_DEPARTMENT_OTHER): Payer: Self-pay | Admitting: *Deleted

## 2014-10-01 NOTE — Progress Notes (Signed)
NPO AFTER MN. ARRIVE AT 0900. NEEDS HG. WILL TAKE PROTONIX AM DOS W/ SIPS OF WATER.

## 2014-10-02 ENCOUNTER — Ambulatory Visit (HOSPITAL_BASED_OUTPATIENT_CLINIC_OR_DEPARTMENT_OTHER)
Admission: RE | Admit: 2014-10-02 | Discharge: 2014-10-02 | Disposition: A | Discharge status: Home / Self Care | Payer: BC Managed Care – PPO | Source: Ambulatory Visit | Attending: Urology | Admitting: Urology

## 2014-10-02 ENCOUNTER — Encounter (HOSPITAL_BASED_OUTPATIENT_CLINIC_OR_DEPARTMENT_OTHER): Payer: Self-pay | Admitting: *Deleted

## 2014-10-02 ENCOUNTER — Ambulatory Visit (HOSPITAL_BASED_OUTPATIENT_CLINIC_OR_DEPARTMENT_OTHER): Payer: BC Managed Care – PPO | Admitting: Anesthesiology

## 2014-10-02 ENCOUNTER — Encounter (HOSPITAL_BASED_OUTPATIENT_CLINIC_OR_DEPARTMENT_OTHER)
Admission: RE | Disposition: A | Discharge status: Home / Self Care | Payer: Self-pay | Source: Ambulatory Visit | Attending: Urology

## 2014-10-02 DIAGNOSIS — G43909 Migraine, unspecified, not intractable, without status migrainosus: Secondary | ICD-10-CM | POA: Insufficient documentation

## 2014-10-02 DIAGNOSIS — Z8379 Family history of other diseases of the digestive system: Secondary | ICD-10-CM | POA: Diagnosis not present

## 2014-10-02 DIAGNOSIS — K219 Gastro-esophageal reflux disease without esophagitis: Secondary | ICD-10-CM | POA: Diagnosis not present

## 2014-10-02 DIAGNOSIS — K5900 Constipation, unspecified: Secondary | ICD-10-CM | POA: Diagnosis not present

## 2014-10-02 DIAGNOSIS — Z8669 Personal history of other diseases of the nervous system and sense organs: Secondary | ICD-10-CM | POA: Insufficient documentation

## 2014-10-02 DIAGNOSIS — Z809 Family history of malignant neoplasm, unspecified: Secondary | ICD-10-CM | POA: Insufficient documentation

## 2014-10-02 DIAGNOSIS — N393 Stress incontinence (female) (male): Secondary | ICD-10-CM | POA: Insufficient documentation

## 2014-10-02 DIAGNOSIS — F418 Other specified anxiety disorders: Secondary | ICD-10-CM | POA: Insufficient documentation

## 2014-10-02 DIAGNOSIS — R0602 Shortness of breath: Secondary | ICD-10-CM | POA: Diagnosis not present

## 2014-10-02 DIAGNOSIS — N135 Crossing vessel and stricture of ureter without hydronephrosis: Secondary | ICD-10-CM

## 2014-10-02 DIAGNOSIS — J302 Other seasonal allergic rhinitis: Secondary | ICD-10-CM | POA: Diagnosis not present

## 2014-10-02 DIAGNOSIS — Z8719 Personal history of other diseases of the digestive system: Secondary | ICD-10-CM | POA: Insufficient documentation

## 2014-10-02 DIAGNOSIS — Z885 Allergy status to narcotic agent status: Secondary | ICD-10-CM | POA: Insufficient documentation

## 2014-10-02 DIAGNOSIS — Z87891 Personal history of nicotine dependence: Secondary | ICD-10-CM | POA: Diagnosis not present

## 2014-10-02 HISTORY — DX: Other seasonal allergic rhinitis: J30.2

## 2014-10-02 HISTORY — DX: Presence of spectacles and contact lenses: Z97.3

## 2014-10-02 HISTORY — DX: Stress incontinence (female) (male): N39.3

## 2014-10-02 HISTORY — DX: Personal history of other diseases of the digestive system: Z87.19

## 2014-10-02 HISTORY — PX: CYSTOSCOPY W/ URETERAL STENT PLACEMENT: SHX1429

## 2014-10-02 HISTORY — DX: Other constipation: K59.09

## 2014-10-02 HISTORY — DX: Crossing vessel and stricture of ureter without hydronephrosis: N13.5

## 2014-10-02 LAB — POCT HEMOGLOBIN-HEMACUE: Hemoglobin: 13.2 g/dL (ref 12.0–15.0)

## 2014-10-02 SURGERY — CYSTOSCOPY, WITH RETROGRADE PYELOGRAM AND URETERAL STENT INSERTION
Anesthesia: General | Site: Ureter | Laterality: Right

## 2014-10-02 MED ORDER — PHENAZOPYRIDINE HCL 200 MG PO TABS: 200.0000 mg | ORAL_TABLET | Freq: Three times a day (TID) | ORAL | Status: DC | PRN

## 2014-10-02 MED ORDER — LACTATED RINGERS IV SOLN
INTRAVENOUS | Status: DC
  Filled 2014-10-02: qty 1000

## 2014-10-02 MED ORDER — LACTATED RINGERS IV SOLN
INTRAVENOUS | Status: DC
  Administered 2014-10-02: 10:00:00 via INTRAVENOUS
  Filled 2014-10-02: qty 1000

## 2014-10-02 MED ORDER — FENTANYL CITRATE 0.05 MG/ML IJ SOLN
25.0000 ug | INTRAMUSCULAR | Status: DC | PRN
  Filled 2014-10-02: qty 1

## 2014-10-02 MED ORDER — FENTANYL CITRATE 0.05 MG/ML IJ SOLN
INTRAMUSCULAR | Status: DC | PRN
  Administered 2014-10-02: 50 ug via INTRAVENOUS

## 2014-10-02 MED ORDER — ONDANSETRON HCL 4 MG/2ML IJ SOLN
INTRAMUSCULAR | Status: DC | PRN
  Administered 2014-10-02: 4 mg via INTRAVENOUS

## 2014-10-02 MED ORDER — IOHEXOL 350 MG/ML SOLN
INTRAVENOUS | Status: DC | PRN
  Administered 2014-10-02: 13 mL via INTRAVENOUS

## 2014-10-02 MED ORDER — SODIUM CHLORIDE 0.9 % IR SOLN
Status: DC | PRN
  Administered 2014-10-02: 3000 mL

## 2014-10-02 MED ORDER — DEXAMETHASONE SODIUM PHOSPHATE 4 MG/ML IJ SOLN
INTRAMUSCULAR | Status: DC | PRN
Start: 2014-10-02 — End: 2014-10-02
  Administered 2014-10-02: 10 mg via INTRAVENOUS

## 2014-10-02 MED ORDER — PHENAZOPYRIDINE HCL 200 MG PO TABS
200.0000 mg | ORAL_TABLET | Freq: Once | ORAL | Status: AC
  Administered 2014-10-02: 200 mg via ORAL
  Filled 2014-10-02: qty 1

## 2014-10-02 MED ORDER — CIPROFLOXACIN IN D5W 200 MG/100ML IV SOLN
200.0000 mg | INTRAVENOUS | Status: AC
  Administered 2014-10-02: 200 mg via INTRAVENOUS
  Filled 2014-10-02: qty 100

## 2014-10-02 MED ORDER — FENTANYL CITRATE 0.05 MG/ML IJ SOLN
INTRAMUSCULAR | Status: AC
  Filled 2014-10-02: qty 4

## 2014-10-02 MED ORDER — PROPOFOL 10 MG/ML IV BOLUS
INTRAVENOUS | Status: DC | PRN
  Administered 2014-10-02: 50 mg via INTRAVENOUS
  Administered 2014-10-02: 150 mg via INTRAVENOUS

## 2014-10-02 MED ORDER — PHENAZOPYRIDINE HCL 100 MG PO TABS
ORAL_TABLET | ORAL | Status: AC
  Filled 2014-10-02: qty 2

## 2014-10-02 MED ORDER — LIDOCAINE HCL (CARDIAC) 20 MG/ML IV SOLN
INTRAVENOUS | Status: DC | PRN
  Administered 2014-10-02: 50 mg via INTRAVENOUS

## 2014-10-02 MED ORDER — CIPROFLOXACIN IN D5W 200 MG/100ML IV SOLN
INTRAVENOUS | Status: AC
  Filled 2014-10-02: qty 100

## 2014-10-02 SURGICAL SUPPLY — 24 items
ADAPTER CATH URET PLST 4-6FR (CATHETERS) IMPLANT
BAG DRAIN URO-CYSTO SKYTR STRL (DRAIN) ×2 IMPLANT
CANISTER SUCT LVC 12 LTR MEDI- (MISCELLANEOUS) ×2 IMPLANT
CATH INTERMIT  6FR 70CM (CATHETERS) IMPLANT
CATH URET 5FR 28IN CONE TIP (BALLOONS)
CATH URET 5FR 70CM CONE TIP (BALLOONS) IMPLANT
CLOTH BEACON ORANGE TIMEOUT ST (SAFETY) ×2 IMPLANT
DRAPE CAMERA CLOSED 9X96 (DRAPES) ×2 IMPLANT
GLOVE BIO SURGEON STRL SZ8 (GLOVE) ×2 IMPLANT
GLOVE BIOGEL M 6.5 STRL (GLOVE) ×2 IMPLANT
GLOVE BIOGEL PI IND STRL 6.5 (GLOVE) ×1 IMPLANT
GLOVE BIOGEL PI INDICATOR 6.5 (GLOVE) ×1
GOWN PREVENTION PLUS LG XLONG (DISPOSABLE) IMPLANT
GOWN STRL REIN XL XLG (GOWN DISPOSABLE) IMPLANT
GOWN STRL REUS W/TWL LRG LVL3 (GOWN DISPOSABLE) ×4 IMPLANT
GOWN STRL REUS W/TWL XL LVL3 (GOWN DISPOSABLE) ×4 IMPLANT
GUIDEWIRE 0.038 PTFE COATED (WIRE) ×2 IMPLANT
GUIDEWIRE ANG ZIPWIRE 038X150 (WIRE) IMPLANT
GUIDEWIRE STR DUAL SENSOR (WIRE) ×2 IMPLANT
IV NS IRRIG 3000ML ARTHROMATIC (IV SOLUTION) ×2 IMPLANT
NS IRRIG 500ML POUR BTL (IV SOLUTION) IMPLANT
PACK CYSTO (CUSTOM PROCEDURE TRAY) ×2 IMPLANT
STENT POLARIS 5FRX24X.038 (STENTS) ×2 IMPLANT
WATER STERILE IRR 3000ML UROMA (IV SOLUTION) IMPLANT

## 2014-10-02 NOTE — H&P (Signed)
Kim Daniel is a 68 year old female who recently underwent a renogram for further evaluation of a right UPJ obstruction.   History of Present Illness Right UPJ obstruction: She reported right flank pain with associated abdominal pain. A CT scan done on 08/07/14 revealed a right UPJ obstruction with a very generous extrarenal pelvis and some blunting of the calyces but no apparent loss of renal parenchyma. Her creatinine was normal at 0.8 and review of her previous imaging studies reveals an ultrasound that was performed in 11/01 and was read as showing no hydronephrosis at that time.  Interval history: She continues to have pain in the right lower quadrant and in her lower back region. Seems to be worse when she bends or stoops over and seems to get better only with the use of a heating pad at night.   Past Medical History Problems  1. History of Anxiety (F41.9) 2. History of depression (Z86.59) 3. History of esophageal reflux (Z87.19) 4. History of migraine headaches (Z86.69)  Surgical History Problems  1. History of Back Surgery 2. History of Total Abdominal Hysterectomy 3. History of Total Hip Replacement 4. History of Tubal Ligation  Current Meds 1. Caltrate 600 1500 MG Oral Tablet;  Therapy: (Recorded:11Nov2015) to Recorded 2. Diazepam 5 MG Oral Tablet;  Therapy: (Recorded:11Nov2015) to Recorded 3. Fluticasone Propionate 50 MCG/ACT Nasal Suspension;  Therapy: (Recorded:11Nov2015) to Recorded 4. Imitrex 100 MG Oral Tablet;  Therapy: (Recorded:11Nov2015) to Recorded 5. Linzess 290 MCG Oral Capsule;  Therapy: (Recorded:11Nov2015) to Recorded 6. Ondansetron HCl - 8 MG Oral Tablet;  Therapy: (Recorded:11Nov2015) to Recorded 7. Protonix 40 MG Oral Tablet Delayed Release;  Therapy: (Recorded:11Nov2015) to Recorded  Allergies Medication  1. No Known Drug Allergies  Family History Problems  1. Family history of hepatic cirrhosis (Z83.79) : Mother 2. Family history of  malignant neoplasm (Z80.9) : Father  Social History Problems  1. Denied: History of Alcohol use 2. Former smoker [phone](Z87.891)  Review of Systems Genitourinary, constitutional, skin, eye, otolaryngeal, hematologic/lymphatic, cardiovascular, pulmonary, endocrine, musculoskeletal, gastrointestinal, neurological and psychiatric system(s) were reviewed and pertinent findings if present are noted.    Vitals Vital Signs  Height: 5 ft 5 in Weight: 132 lb  BMI Calculated: 21.97 BSA Calculated: 1.66 Blood Pressure: 110 / 73 Heart Rate: 74  Physical Exam Constitutional: Well nourished and well developed . No acute distress.   ENT:. The ears and nose are normal in appearance.   Neck: The appearance of the neck is normal and no neck mass is present.   Pulmonary: No respiratory distress and normal respiratory rhythm and effort.   Cardiovascular: Heart rate and rhythm are normal . No peripheral edema.   Abdomen: The abdomen is soft and nontender. No masses are palpated. No CVA tenderness. No hernias are palpable. No hepatosplenomegaly noted.   Lymphatics: The femoral and inguinal nodes are not enlarged or tender.   Skin: Normal skin turgor, no visible rash and no visible skin lesions.   Neuro/Psych:. Mood and affect are appropriate.      Assessment  I went over the results of her renogram with her today. It reveals she has normal, equal bilateral function. She does not have delay in uptake of the radiotracer on the right but does have hand side significant delay in drainage on that side. This would seem to indicate the presence of a partial obstruction. Because she continues to have pain that is somewhat uncharacteristic of typical UPJ obstruction I have recommended cystoscopy with retrograde pyelogram and placement of the  stent. If stent placement relieves all of her symptoms then it would appear she would benefit from pyeloplasty. I discussed this with her and gone over the procedure with  her in detail. We've discussed the probability of success, the risks and, patient's, the outpatient nature of the procedure as well as the anticipated postoperative course. I have answered all of her questions and she has elected to proceed.   Plan and She is scheduled for cystoscopy with right retrograde pyelogram and stent placement.

## 2014-10-02 NOTE — Op Note (Signed)
PATIENT:  Kim ChuteBarbara F Galan  PRE-OPERATIVE DIAGNOSIS: Right UPJ obstruction  POST-OPERATIVE DIAGNOSIS: Same  PROCEDURE: Cystoscopy with right retrograde pyelogram including interpretation. Right double-J stent placement  SURGEON:  Garnett FarmMark C Devion Chriscoe  INDICATION: Kim ChuteBarbara F Flurry is a 68 year old female with intermittent right flank pain and a dilated renal pelvis and intrarenal collecting system with delay on that side on renogram. A stent is being placed in order to determine if her intermittent flank pain is in fact from her UPJ obstruction. There is some question as it appears she has a very patulous, large extrarenal pelvis on the right-hand side and actually has an extrarenal pelvis on the left as well.  ANESTHESIA:  General  EBL:  Minimal  DRAINS: 5 French, 24 cm Polaris stent in the right ureter.  LOCAL MEDICATIONS USED:  None  SPECIMEN:  None  Description of procedure: After informed consent the patient was taken to the operating room and placed on the table in a supine position. General anesthesia was then administered. Once fully anesthetized the patient was moved to the dorsal lithotomy position and the genitalia were sterilely prepped and draped in standard fashion. An official timeout was then performed.  The 22 French cystoscope was passed into the bladder with a 12 lens and the bladder was then fully and systematically inspected. No tumors, stones or inflammatory lesions were identified. The ureteral orifices were noted to be of normal configuration and position.  A 6 French open-ended ureteral catheter was passed through the cystoscope in order to perform a right retrograde pyelogram the standard fashion. I injected full-strength Omnipaque through the open-ended catheter after I had intubated the right ureteral orifice. Direct fluoroscopy was used to watch the contrast passed up the right ureter without any evidence of filling defect or mass effect. As the ureter approached the  ureteropelvic junction and it appeared to deviate laterally and did have some jetting type effect as contrast entered the area the renal pelvis. The renal pelvis and collecting system revealed dilatation but no other abnormality was noted.  I then passed a 0.038 inch floppy-tipped guidewire through the open ended catheter and up the right ureter into the area of the renal pelvis. I then passed the open-ended catheter over the guidewire into the area the renal pelvis and remove the guidewire to see if there was a hydronephrotic drip and there was none. I therefore replaced the guidewire and removed the open-ended catheter. Over the guidewire the Polaris stent was then passed into the area of the renal pelvis and as I removed the guidewire there was good curl in the renal pelvis. I then drained the bladder and removed the cystoscope. The patient was awakened and taken to recovery room in stable and satisfactory condition. She tolerated procedure well with no intraoperative complications.  PLAN OF CARE: Discharge to home after PACU  PATIENT DISPOSITION:  PACU - hemodynamically stable.

## 2014-10-02 NOTE — Anesthesia Preprocedure Evaluation (Addendum)
Anesthesia Evaluation  Patient identified by MRN, date of birth, ID band Patient awake    Reviewed: Allergy & Precautions, H&P , NPO status , Patient's Chart, lab work & pertinent test results  Airway Mallampati: II  TM Distance: >3 FB Neck ROM: full    Dental no notable dental hx. (+) Teeth Intact, Dental Advisory Given   Pulmonary neg pulmonary ROS, shortness of breath and with exertion, former smoker,  breath sounds clear to auscultation  Pulmonary exam normal       Cardiovascular Exercise Tolerance: Good negative cardio ROS  Rhythm:regular Rate:Normal     Neuro/Psych negative neurological ROS  negative psych ROS   GI/Hepatic negative GI ROS, Neg liver ROS,   Endo/Other  negative endocrine ROS  Renal/GU negative Renal ROS  negative genitourinary   Musculoskeletal   Abdominal   Peds  Hematology negative hematology ROS (+)   Anesthesia Other Findings   Reproductive/Obstetrics negative OB ROS                            Anesthesia Physical Anesthesia Plan  ASA: II  Anesthesia Plan: General   Post-op Pain Management:    Induction: Intravenous  Airway Management Planned: LMA  Additional Equipment:   Intra-op Plan:   Post-operative Plan:   Informed Consent: I have reviewed the patients History and Physical, chart, labs and discussed the procedure including the risks, benefits and alternatives for the proposed anesthesia with the patient or authorized representative who has indicated his/her understanding and acceptance.   Dental Advisory Given  Plan Discussed with: CRNA and Surgeon  Anesthesia Plan Comments:         Anesthesia Quick Evaluation

## 2014-10-02 NOTE — Discharge Instructions (Addendum)
Post stent placement instructions   Definitions:  Ureter: The duct that transports urine from the kidney to the bladder. Stent: A plastic hollow tube that is placed into the ureter, from the kidney to the bladder to prevent the ureter from swelling shut.  General instructions:  Despite the fact that no skin incisions were used, the area around the ureter and bladder is raw and irritated. The stent is a foreign body which can further irritate the bladder wall. This irritation is manifested by increased frequency of urination, both day and night, and by an increase in the urge to urinate. In some, the urge to urinate is present almost always. Sometimes the urge is strong enough that you may not be able to stop your self from urinating. This can often be controlled with medication but does not occur in everyone. A stent can safely be left in place for 3 months or greater.  You may see some blood in your urine while the stent is in place and a few days afterward. Do not be alarmed, even if the urine is clear for a while. Get off your feet and drink lots of fluids until clearing occurs. If you start to pass clots or don't improve, call us.  Diet:  You may return to your normal diet immediately. Because of the raw surface of your bladder, alcohol, spicy foods, foods high in acid and drinks with caffeine may cause irritation or frequency and should be used in moderation. To keep your urine flowing freely and avoid constipation, drink plenty of fluids during the day (8-10 glasses). Tip: Avoid cranberry juice because it is very acidic.  Activity:  Your physical activity doesn't need to be restricted. However, if you are very active, you may see some blood in the urine. We suggest that you reduce your activity under the circumstances until the bleeding has stopped.  Bowels:  It is important to keep your bowels regular during the postoperative period. Straining with bowel movements can cause bleeding. A  bowel movement every other day is reasonable. Use a mild laxative if needed, such as milk of magnesia 2-3 tablespoons, or 2 Dulcolax tablets. Call if you continue to have problems. If you had been taking narcotics for pain, before, during or after your surgery, you may be constipated. Take a laxative if necessary.  Medication:  You should resume your pre-surgery medications unless told not to. In addition you may be given an antibiotic to prevent or treat infection. Antibiotics are not always necessary. All medication should be taken as prescribed until the bottles are finished unless you are having an unusual reaction to one of the drugs.  Problems you should report to us:  a. Fever greater than 101F. b. Heavy bleeding, or clots (see notes above about blood in urine). c. Inability to urinate. d. Drug reactions (hives, rash, nausea, vomiting, diarrhea). e. Severe burning or pain with urination that is not improving.  General Anesthesia General anesthesia is a sleep-like state of non-feeling produced by medicines (anesthetics). General anesthesia prevents you from being alert and feeling pain during a medical procedure. Your caregiver may recommend general anesthesia if your procedure:  Is long.  Is painful or uncomfortable.  Would be frightening to see or hear.  Requires you to be still.  Affects your breathing.  Causes significant blood loss. LET YOUR CAREGIVER KNOW ABOUT:  Allergies to food or medicine.  Medicines taken, including vitamins, herbs, eyedrops, over-the-counter medicines, and creams.  Use of steroids (by mouth or  creams).  Previous problems with anesthetics or numbing medicines, including problems experienced by relatives.  History of bleeding problems or blood clots.  Previous surgeries and types of anesthetics received.  Possibility of pregnancy, if this applies.  Use of cigarettes, alcohol, or illegal drugs.  Any health condition(s), especially  diabetes, sleep apnea, and high blood pressure. RISKS AND COMPLICATIONS General anesthesia rarely causes complications. However, if complications do occur, they can be life threatening. Complications include:  A lung infection.  A stroke.  A heart attack.  Waking up during the procedure. When this occurs, the patient may be unable to move and communicate that he or she is awake. The patient may feel severe pain. Older adults and adults with serious medical problems are more likely to have complications than adults who are young and healthy. Some complications can be prevented by answering all of your caregiver's questions thoroughly and by following all pre-procedure instructions. It is important to tell your caregiver if any of the pre-procedure instructions, especially those related to diet, were not followed. Any food or liquid in the stomach can cause problems when you are under general anesthesia. BEFORE THE PROCEDURE  Ask your caregiver if you will have to spend the night at the hospital. If you will not have to spend the night, arrange to have an adult drive you and stay with you for 24 hours.  Follow your caregiver's instructions if you are taking dietary supplements or medicines. Your caregiver may tell you to stop taking them or to reduce your dosage.  Do not smoke for as long as possible before your procedure. If possible, stop smoking 3-6 weeks before the procedure.  Do not take new dietary supplements or medicines within 1 week of your procedure unless your caregiver approves them.  Do not eat within 8 hours of your procedure or as directed by your caregiver. Drink only clear liquids, such as water, black coffee (without milk or cream), and fruit juices (without pulp).  Do not drink within 3 hours of your procedure or as directed by your caregiver.  You may brush your teeth on the morning of the procedure, but make sure to spit out the toothpaste and water when  finished. PROCEDURE  You will receive anesthetics through a mask, through an intravenous (IV) access tube, or through both. A doctor who specializes in anesthesia (anesthesiologist) or a nurse who specializes in anesthesia (nurse anesthetist) or both will stay with you throughout the procedure to make sure you remain unconscious. He or she will also watch your blood pressure, pulse, and oxygen levels to make sure that the anesthetics do not cause any problems. Once you are asleep, a breathing tube or mask may be used to help you breathe. AFTER THE PROCEDURE You will wake up after the procedure is complete. You may be in the room where the procedure was performed or in a recovery area. You may have a sore throat if a breathing tube was used. You may also feel:  Dizzy.  Weak.  Drowsy.  Confused.  Nauseous.  Cold. These are all normal responses and can be expected to last for up to 24 hours after the procedure is complete. A caregiver will tell you when you are ready to go home. This will usually be when you are fully awake and in stable condition. Document Released: 01/16/2008 Document Revised: 02/23/2014 Document Reviewed: 02/07/2012 [facility] Patient Information 2015 [employer], [place]. This information is not intended to replace advice given to you by your health  care provider. Make sure you discuss any questions you have with your health care provider.  Post Anesthesia Home Care Instructions  Activity: Get plenty of rest for the remainder of the day. A responsible adult should stay with you for 24 hours following the procedure.  For the next 24 hours, DO NOT: -Drive a car -Advertising copywriter -Drink alcoholic beverages -Take any medication unless instructed by your physician -Make any legal decisions or sign important papers.  Meals: Start with liquid foods such as gelatin or soup. Progress to regular foods as tolerated. Avoid greasy, spicy, heavy foods. If nausea and/or vomiting occur,  drink only clear liquids until the nausea and/or vomiting subsides. Call your physician if vomiting continues.  Special Instructions/Symptoms: Your throat may feel dry or sore from the anesthesia or the breathing tube placed in your throat during surgery. If this causes discomfort, gargle with warm salt water. The discomfort should disappear within 24 hours.

## 2014-10-02 NOTE — Anesthesia Postprocedure Evaluation (Signed)
  Anesthesia Post-op Note  Patient: Kim ChuteBarbara F Daniel  Procedure(s) Performed: Procedure(s) (LRB): CYSTOSCOPY WITH RIGHT RETROGRADE PYELOGRAM , URETERAL STENT PLACEMENT (Right)  Patient Location: PACU  Anesthesia Type: General  Level of Consciousness: awake and alert   Airway and Oxygen Therapy: Patient Spontanous Breathing  Post-op Pain: mild  Post-op Assessment: Post-op Vital signs reviewed, Patient's Cardiovascular Status Stable, Respiratory Function Stable, Patent Airway and No signs of Nausea or vomiting  Last Vitals:  Filed Vitals:   10/02/14 1115  BP: 118/77  Pulse: 84  Temp:   Resp: 9    Post-op Vital Signs: stable   Complications: No apparent anesthesia complications

## 2014-10-02 NOTE — Anesthesia Procedure Notes (Signed)
Procedure Name: LMA Insertion Date/Time: 10/02/2014 10:45 AM Performed by: Maris BergerENENNY, Zachary Lovins T Pre-anesthesia Checklist: Patient identified, Emergency Drugs available, Suction available and Patient being monitored Patient Re-evaluated:Patient Re-evaluated prior to inductionOxygen Delivery Method: Circle System Utilized Preoxygenation: Pre-oxygenation with 100% oxygen Intubation Type: IV induction Ventilation: Mask ventilation without difficulty LMA: LMA inserted LMA Size: 4.0 Number of attempts: 1 Airway Equipment and Method: bite block Placement Confirmation: positive ETCO2 Dental Injury: Teeth and Oropharynx as per pre-operative assessment

## 2014-10-02 NOTE — Transfer of Care (Signed)
Immediate Anesthesia Transfer of Care Note  Patient: Kim ChuteBarbara F Daniel  Procedure(s) Performed: Procedure(s): CYSTOSCOPY WITH RIGHT RETROGRADE PYELOGRAM , URETERAL STENT PLACEMENT (Right)  Patient Location: PACU  Anesthesia Type:General  Level of Consciousness: awake, alert  and oriented  Airway & Oxygen Therapy: Patient Spontanous Breathing and Patient connected to nasal cannula oxygen  Post-op Assessment: Report given to PACU RN  Post vital signs: Reviewed  Complications: No apparent anesthesia complications

## 2014-10-05 ENCOUNTER — Encounter (HOSPITAL_BASED_OUTPATIENT_CLINIC_OR_DEPARTMENT_OTHER): Payer: Self-pay | Admitting: Urology

## 2014-11-03 ENCOUNTER — Other Ambulatory Visit: Payer: Self-pay | Admitting: Family Medicine

## 2015-01-22 IMAGING — NM NM RENAL IMAGING FLOW W/ PHARM
2 series · 12 of 12 positions shown · non-contrast
Comparison: 08/07/2014

CLINICAL DATA: Evaluate right hydronephrosis

EXAM:
NUCLEAR MEDICINE RENAL SCAN WITH DIURETIC ADMINISTRATION
TECHNIQUE: Radionuclide angiographic and sequential renal images were obtained
after intravenous injection of radiopharmaceutical. Imaging was
continued during slow intravenous injection of Lasix approximately
15 minutes after the start of the examination.
RADIOPHARMACEUTICALS:  15.2 mCi Xc-PPm MAG3

[Series 1: re renal qualitative · 9.51mm/px · 6 of 130 frames shown (1 of 2)]
[frame 11/130]
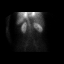
[frame 33/130]
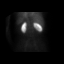
[frame 55/130]
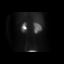
[frame 76/130]
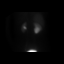
[frame 98/130]
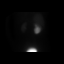
[frame 120/130]
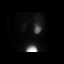

[Series 1: re renal qualitative · 9.51mm/px · 6 of 130 frames shown (2 of 2)]
[frame 11/130]
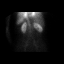
[frame 33/130]
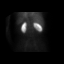
[frame 55/130]
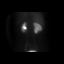
[frame 76/130]
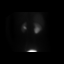
[frame 98/130]
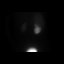
[frame 120/130]
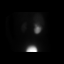

[12 of 12 positions shown; findings below may reference images not displayed]

FINDINGS: Flow:  Prompt symmetric arterial flow to the kidneys.

Left renogram: There is normal cortical uptake by the left kidney.
On the delayed images there is normal and expected excretion in
clearance of the radiopharmaceutical.

Right renogram: Normal cortical uptake is identified by the right
kidney. There is head area of central photopenia corresponding to
dilated right renal collecting system. On the delayed images there
is delayed and diminished excretion and clearance of the
radiopharmaceutical from the copious right renal collecting system.

Differential:

Left kidney = 48.2 %

Right kidney = 51.8 %

T1/2 post Lasix :

Left kidney = 9 min

Right kidney = never achieved min
IMPRESSION: 1. No evidence for high-grade obstructive uropathy involving the
right kidney. There is normal cortical perfusion and cortical uptake
by the right renal parenchyma. Markedly dilated right renal
collecting system compatible with partial obstruction at the UPJ.
2. Normally functioning left kidney.
3. Split renal function is equal to 48.2% from the left and 51.8%
from the right.

## 2015-02-04 ENCOUNTER — Other Ambulatory Visit (HOSPITAL_COMMUNITY): Payer: Self-pay | Admitting: Urology

## 2015-02-04 DIAGNOSIS — N135 Crossing vessel and stricture of ureter without hydronephrosis: Secondary | ICD-10-CM

## 2015-02-09 ENCOUNTER — Other Ambulatory Visit: Payer: Self-pay | Admitting: Family Medicine

## 2015-02-26 ENCOUNTER — Ambulatory Visit (HOSPITAL_COMMUNITY)
Admission: RE | Admit: 2015-02-26 | Discharge: 2015-02-26 | Disposition: A | Discharge status: Home / Self Care | Payer: BLUE CROSS/BLUE SHIELD | Source: Ambulatory Visit | Attending: Urology | Admitting: Urology

## 2015-02-26 DIAGNOSIS — N135 Crossing vessel and stricture of ureter without hydronephrosis: Secondary | ICD-10-CM | POA: Diagnosis not present

## 2015-02-26 MED ORDER — TECHNETIUM TC 99M MERTIATIDE
15.3000 | Freq: Once | INTRAVENOUS | Status: AC | PRN
  Administered 2015-02-26: 15 via INTRAVENOUS

## 2015-02-26 MED ORDER — FUROSEMIDE 10 MG/ML IJ SOLN: 1.2000 mg | Freq: Once | INTRAMUSCULAR | Status: DC

## 2015-02-26 MED ORDER — FUROSEMIDE 10 MG/ML IJ SOLN
30.0000 mg | Freq: Once | INTRAMUSCULAR | Status: AC
  Administered 2015-02-26: 30 mg via INTRAVENOUS
  Filled 2015-02-26: qty 4

## 2015-03-12 ENCOUNTER — Other Ambulatory Visit: Payer: Self-pay

## 2015-03-15 ENCOUNTER — Other Ambulatory Visit (INDEPENDENT_AMBULATORY_CARE_PROVIDER_SITE_OTHER): Payer: BLUE CROSS/BLUE SHIELD

## 2015-03-15 DIAGNOSIS — Z0000 Encounter for general adult medical examination without abnormal findings: Secondary | ICD-10-CM | POA: Diagnosis not present

## 2015-03-15 LAB — LIPID PANEL
Cholesterol: 206 mg/dL — ABNORMAL HIGH (ref 0–200)
HDL: 68.1 mg/dL (ref >39.00)
LDL Cholesterol: 119 mg/dL — ABNORMAL HIGH (ref 0–99)
NonHDL: 137.9
Total CHOL/HDL Ratio: 3
Triglycerides: 96 mg/dL (ref 0.0–149.0)
VLDL: 19.2 mg/dL (ref 0.0–40.0)

## 2015-03-15 LAB — CBC WITH DIFFERENTIAL/PLATELET
Basophils Absolute: 0 10*3/uL (ref 0.0–0.1)
Basophils Relative: 0.8 % (ref 0.0–3.0)
Eosinophils Absolute: 0.3 10*3/uL (ref 0.0–0.7)
Eosinophils Relative: 4.6 % (ref 0.0–5.0)
HCT: 40.4 % (ref 36.0–46.0)
Hemoglobin: 13.6 g/dL (ref 12.0–15.0)
Lymphocytes Relative: 33.6 % (ref 12.0–46.0)
Lymphs Abs: 2 10*3/uL (ref 0.7–4.0)
MCHC: 33.7 g/dL (ref 30.0–36.0)
MCV: 85.7 fl (ref 78.0–100.0)
Monocytes Absolute: 0.7 10*3/uL (ref 0.1–1.0)
Monocytes Relative: 11 % (ref 3.0–12.0)
Neutro Abs: 3 10*3/uL (ref 1.4–7.7)
Neutrophils Relative %: 50 % (ref 43.0–77.0)
Platelets: 212 10*3/uL (ref 150.0–400.0)
RBC: 4.72 Mil/uL (ref 3.87–5.11)
RDW: 14 % (ref 11.5–15.5)
WBC: 6 10*3/uL (ref 4.0–10.5)

## 2015-03-15 LAB — HEPATIC FUNCTION PANEL
ALT: 14 U/L (ref 0–35)
AST: 26 U/L (ref 0–37)
Albumin: 4.5 g/dL (ref 3.5–5.2)
Alkaline Phosphatase: 44 U/L (ref 39–117)
Bilirubin, Direct: 0.1 mg/dL (ref 0.0–0.3)
Total Bilirubin: 0.6 mg/dL (ref 0.2–1.2)
Total Protein: 6.9 g/dL (ref 6.0–8.3)

## 2015-03-15 LAB — BASIC METABOLIC PANEL
BUN: 13 mg/dL (ref 6–23)
CO2: 29 mEq/L (ref 19–32)
Calcium: 9.4 mg/dL (ref 8.4–10.5)
Chloride: 103 mEq/L (ref 96–112)
Creatinine, Ser: 0.74 mg/dL (ref 0.40–1.20)
GFR: 82.59 mL/min (ref >60.00)
Glucose, Bld: 93 mg/dL (ref 70–99)
Potassium: 4.4 mEq/L (ref 3.5–5.1)
Sodium: 138 mEq/L (ref 135–145)

## 2015-03-15 LAB — TSH: TSH: 2.61 u[IU]/mL (ref 0.35–4.50)

## 2015-03-19 ENCOUNTER — Encounter: Payer: Self-pay | Admitting: Family Medicine

## 2015-03-19 ENCOUNTER — Ambulatory Visit (INDEPENDENT_AMBULATORY_CARE_PROVIDER_SITE_OTHER): Payer: BLUE CROSS/BLUE SHIELD | Admitting: Family Medicine

## 2015-03-19 VITALS — BP 116/70 | HR 89 | Temp 98.3°F | Ht 64.17 in | Wt 134.0 lb

## 2015-03-19 DIAGNOSIS — Z23 Encounter for immunization: Secondary | ICD-10-CM

## 2015-03-19 DIAGNOSIS — M545 Low back pain: Secondary | ICD-10-CM

## 2015-03-19 DIAGNOSIS — M5430 Sciatica, unspecified side: Secondary | ICD-10-CM | POA: Diagnosis not present

## 2015-03-19 DIAGNOSIS — Z0000 Encounter for general adult medical examination without abnormal findings: Secondary | ICD-10-CM | POA: Diagnosis not present

## 2015-03-19 DIAGNOSIS — F5104 Psychophysiologic insomnia: Secondary | ICD-10-CM

## 2015-03-19 DIAGNOSIS — M5440 Lumbago with sciatica, unspecified side: Secondary | ICD-10-CM

## 2015-03-19 DIAGNOSIS — F39 Unspecified mood [affective] disorder: Secondary | ICD-10-CM

## 2015-03-19 DIAGNOSIS — G4700 Insomnia, unspecified: Secondary | ICD-10-CM

## 2015-03-19 MED ORDER — CITALOPRAM HYDROBROMIDE 40 MG PO TABS: 40.0000 mg | ORAL_TABLET | Freq: Every day | ORAL | Status: DC

## 2015-03-19 MED ORDER — DIAZEPAM 5 MG PO TABS: ORAL_TABLET | ORAL | Status: DC

## 2015-03-19 NOTE — Progress Notes (Signed)
Pre visit review using our clinic review tool, if applicable. No additional management support is needed unless otherwise documented below in the visit note. 

## 2015-03-19 NOTE — Progress Notes (Signed)
Subjective:    Patient ID: Kim Daniel, female    DOB: 1946/05/29, 69 y.o.   MRN: [id number]  HPI   Patient seen for complete physical. She sees gynecologist regularly.   She plans to get DEXA scan and mammogram. Office next month. She has not had Prevnar 13. Colonoscopy up-to-date. Tetanus up-to-date. She gets yearly flu vaccine.   she has history of mood disorder and has been on citalopram for quite some time. Requesting refills. Depression stable. Also takes dose diazepam at night for insomnia and has been on this for many years.   She has had some chronic right flank pain which radiates toward the right lower abdomen for over a year. She's had fairly extensive workup. CT abdomen pelvis last year revealed severe right hydronephrosis. She's had urologic workup and continues to see them. They do not think her hydronephrosis has anything to do with her flank pain. She takes tramadol which helped somewhat. She describes achy pain sometimes 8 out of 10 severity that radiates from the mid lumbar region anterior and occasional down the right lower extremity. No lower extremity numbness or weakness. No urine or stool incontinence. No appetite or weight changes. No fevers or chills. History of osteopenia by previous DEXA scan.  Reviewed with no change:  Past Medical History  Diagnosis Date  . GERD (gastroesophageal reflux disease)   . History of migraine headaches   . Anxiety and depression   . Ureteral obstruction, right   . Seasonal allergic rhinitis   . Anxiety   . Depression   . History of hiatal hernia   . Chronic constipation   . SUI (stress urinary incontinence, female)   . Wears contact lenses    Past Surgical History  Procedure Laterality Date  . Total hip arthroplasty  03/03/2012    Procedure: TOTAL HIP ARTHROPLASTY;  Surgeon: [name], MD;  Location: [facility];  Service: Orthopedics;  Laterality: Left;  . Esophagogastroduodenoscopy  04-02-2002  . Anterior cervical  decomp/discectomy fusion  03-10-2004    C4 -- C6  . Colonoscopy with propofol  03-08-2009  . Vaginal hysterectomy  1976  . Tubal ligation  1971  . Cystoscopy w/ ureteral stent placement Right 10/02/2014    Procedure: CYSTOSCOPY WITH RIGHT RETROGRADE PYELOGRAM , URETERAL STENT PLACEMENT;  Surgeon: Garnett Farm, MD;  Location: [facility] [place];  Service: Urology;  Laterality: Right;    reports that she quit smoking about 15 years ago. Her smoking use included Cigarettes. She has a 10 pack-year smoking history. She has never used smokeless tobacco. She reports that she drinks about 0.6 oz of alcohol per week. She reports that she does not use illicit drugs. family history includes Cancer (age of onset: 89) in her father; Cirrhosis in her mother. Allergies  Allergen Reactions  . Hydrocodone Other (See Comments)    "get dizzy and spaced out"      Review of Systems  Constitutional: Negative for fever, activity change, appetite change, fatigue and unexpected weight change.  HENT: Negative for ear pain, hearing loss, sore throat and trouble swallowing.   Eyes: Negative for visual disturbance.  Respiratory: Negative for cough and shortness of breath.   Cardiovascular: Negative for chest pain and palpitations.  Gastrointestinal: Negative for abdominal pain, diarrhea, constipation and blood in stool.  Genitourinary: Negative for dysuria and hematuria.  Musculoskeletal: Positive for back pain. Negative for myalgias and arthralgias.  Skin: Negative for rash.  Neurological: Negative for dizziness, syncope and headaches.  Hematological: Negative for adenopathy.  Psychiatric/Behavioral: Negative for confusion and dysphoric mood.       Objective:   Physical Exam  Constitutional: She is oriented to person, place, and time. She appears well-developed and well-nourished.  HENT:  Head: Normocephalic and atraumatic.  Eyes: EOM are normal. Pupils are equal, round, and reactive to light.    Neck: Normal range of motion. Neck supple. No thyromegaly present.  Cardiovascular: Normal rate, regular rhythm and normal heart sounds.   No murmur heard. Pulmonary/Chest: Breath sounds normal. No respiratory distress. She has no wheezes. She has no rales.  Abdominal: Soft. Bowel sounds are normal. She exhibits no distension and no mass. There is no tenderness. There is no rebound and no guarding.  Musculoskeletal: Normal range of motion. She exhibits no edema.  She has some mild tenderness to palpation right lower lumbar region. Straight leg raise are negative  Lymphadenopathy:    She has no cervical adenopathy.  Neurological: She is alert and oriented to person, place, and time. She displays normal reflexes. No cranial nerve deficit.  Deep tendon reflexes are symmetric knee and ankle bilaterally. Full-strength lower extremities  Skin: No rash noted.  Psychiatric: She has a normal mood and affect. Her behavior is normal. Judgment and thought content normal.          Assessment & Plan:   #1 health maintenance. Patient will get DEXA scan and mammogram through her GYN office next month. Prevnar 13 given. Continue yearly flu vaccine. Other health maintenance up-to-date. Continue regular calcium and vitamin D and weightbearing exercise. #2 chronic right flank and lumbar back pain. She's tried heat and ice along with multiple medications and still has fairly poor relief at times. Set up MRI lumbar spine to further evaluate #3 chronic insomnia. Refill diazepam for as needed use #4 history of mood disorder. Refill citalopram.

## 2015-03-24 ENCOUNTER — Other Ambulatory Visit: Payer: BLUE CROSS/BLUE SHIELD

## 2015-03-26 ENCOUNTER — Ambulatory Visit
Admission: RE | Admit: 2015-03-26 | Discharge: 2015-03-26 | Disposition: A | Discharge status: Home / Self Care | Payer: BLUE CROSS/BLUE SHIELD | Source: Ambulatory Visit | Attending: Family Medicine | Admitting: Family Medicine

## 2015-03-26 DIAGNOSIS — M5440 Lumbago with sciatica, unspecified side: Secondary | ICD-10-CM

## 2015-07-07 ENCOUNTER — Encounter: Payer: Self-pay | Admitting: Family Medicine

## 2015-07-07 ENCOUNTER — Ambulatory Visit (INDEPENDENT_AMBULATORY_CARE_PROVIDER_SITE_OTHER): Payer: BLUE CROSS/BLUE SHIELD | Admitting: Family Medicine

## 2015-07-07 VITALS — BP 110/80 | HR 86 | Temp 98.0°F | Ht 64.17 in | Wt 135.1 lb

## 2015-07-07 DIAGNOSIS — G4700 Insomnia, unspecified: Secondary | ICD-10-CM | POA: Diagnosis not present

## 2015-07-07 DIAGNOSIS — F341 Dysthymic disorder: Secondary | ICD-10-CM | POA: Diagnosis not present

## 2015-07-07 MED ORDER — BUPROPION HCL ER (XL) 150 MG PO TB24: 150.0000 mg | ORAL_TABLET | Freq: Every day | ORAL | Status: DC

## 2015-07-07 NOTE — Progress Notes (Signed)
Pre visit review using our clinic review tool, if applicable. No additional management support is needed unless otherwise documented below in the visit note. 

## 2015-07-07 NOTE — Progress Notes (Signed)
Subjective:    Patient ID: Kim Daniel, female    DOB: August 03, 1946, 69 y.o.   MRN: [id number]  HPI Long history of depression. Patient's been on Celexa for several years. She states about 30 years ago she had some suicidal ideation and was admitted. She has not had any recent suicidal ideation. She's had several weeks if not months of decreased concentration, insomnia, fatigue, decreased drive and loss of interest in activities. She takes Celexa regularly. She took Prozac once before but this did not work as well for her. Denies any prior medications other than the above.  She drinks usually 1 alcoholic beverage per night. Frequently watches TV or I- pad at night. She had difficulty falling asleep and also wakening. She has taken things like Advil PM with minimal effect. Takes diazepam occasionally without much improvement.  Past Medical History  Diagnosis Date  . GERD (gastroesophageal reflux disease)   . History of migraine headaches   . Anxiety and depression   . Ureteral obstruction, right   . Seasonal allergic rhinitis   . Anxiety   . Depression   . History of hiatal hernia   . Chronic constipation   . SUI (stress urinary incontinence, female)   . Wears contact lenses    Past Surgical History  Procedure Laterality Date  . Total hip arthroplasty  03/03/2012    Procedure: TOTAL HIP ARTHROPLASTY;  Surgeon: [name], MD;  Location: [facility];  Service: Orthopedics;  Laterality: Left;  . Esophagogastroduodenoscopy  04-02-2002  . Anterior cervical decomp/discectomy fusion  03-10-2004    C4 -- C6  . Colonoscopy with propofol  03-08-2009  . Vaginal hysterectomy  1976  . Tubal ligation  1971  . Cystoscopy w/ ureteral stent placement Right 10/02/2014    Procedure: CYSTOSCOPY WITH RIGHT RETROGRADE PYELOGRAM , URETERAL STENT PLACEMENT;  Surgeon: Garnett Farm, MD;  Location: [facility] [place];  Service: Urology;  Laterality: Right;    reports that she quit smoking  about 15 years ago. Her smoking use included Cigarettes. She has a 10 pack-year smoking history. She has never used smokeless tobacco. She reports that she drinks about 0.6 oz of alcohol per week. She reports that she does not use illicit drugs. family history includes Cancer (age of onset: 36) in her father; Cirrhosis in her mother. Allergies  Allergen Reactions  . Hydrocodone Other (See Comments)    "get dizzy and spaced out"  '    Review of Systems  Constitutional: Negative for fatigue.  Eyes: Negative for visual disturbance.  Respiratory: Negative for cough, chest tightness, shortness of breath and wheezing.   Cardiovascular: Negative for chest pain, palpitations and leg swelling.  Neurological: Negative for dizziness, seizures, syncope, weakness, light-headedness and headaches.  Psychiatric/Behavioral: Positive for sleep disturbance, dysphoric mood and decreased concentration. Negative for suicidal ideas, hallucinations, confusion and agitation.       Objective:   Physical Exam  Constitutional: She is oriented to person, place, and time. She appears well-developed and well-nourished.  Neck: Neck supple. No thyromegaly present.  Cardiovascular: Normal rate and regular rhythm.   Pulmonary/Chest: Effort normal and breath sounds normal. No respiratory distress. She has no wheezes. She has no rales.  Neurological: She is alert and oriented to person, place, and time. No cranial nerve deficit.  Psychiatric: She has a normal mood and affect. Her behavior is normal. Judgment and thought content normal.           Assessment & Plan:  Chronic  depression with recent worsening. PHQ-9 score of 18. Continue Celexa. Try low-dose Wellbutrin XL 150 mg once daily. Hopefully this will help with her fatigue and difficulties concentrating. Recent TSH normal. She's had counseling in the past. She is advised to take in the AM.  Office follow up in 3 weeks.  Insomnia. Sleep hygiene discussed. Avoid  alcohol at night. Avoid computers or TV or other types of bright screens  at night.  Avoid escalation in Benzodiazepines or sedative hypnotics.

## 2015-07-07 NOTE — Patient Instructions (Signed)
Insomnia Insomnia is frequent trouble falling and/or staying asleep. Insomnia can be a long term problem or a short term problem. Both are common. Insomnia can be a short term problem when the wakefulness is related to a certain stress or worry. Long term insomnia is often related to ongoing stress during waking hours and/or poor sleeping habits. Overtime, sleep deprivation itself can make the problem worse. Every little thing feels more severe because you are overtired and your ability to cope is decreased. CAUSES   Stress, anxiety, and depression.  Poor sleeping habits.  Distractions such as TV in the bedroom.  Naps close to bedtime.  Engaging in emotionally charged conversations before bed.  Technical reading before sleep.  Alcohol and other sedatives. They may make the problem worse. They can hurt normal sleep patterns and normal dream activity.  Stimulants such as caffeine for several hours prior to bedtime.  Pain syndromes and shortness of breath can cause insomnia.  Exercise late at night.  Changing time zones may cause sleeping problems (jet lag). It is sometimes helpful to have someone observe your sleeping patterns. They should look for periods of not breathing during the night (sleep apnea). They should also look to see how long those periods last. If you live alone or observers are uncertain, you can also be observed at a sleep clinic where your sleep patterns will be professionally monitored. Sleep apnea requires a checkup and treatment. Give your caregivers your medical history. Give your caregivers observations your family has made about your sleep.  SYMPTOMS   Not feeling rested in the morning.  Anxiety and restlessness at bedtime.  Difficulty falling and staying asleep. TREATMENT   Your caregiver may prescribe treatment for an underlying medical disorders. Your caregiver can give advice or help if you are using alcohol or other drugs for self-medication. Treatment  of underlying problems will usually eliminate insomnia problems.  Medications can be prescribed for short time use. They are generally not recommended for lengthy use.  Over-the-counter sleep medicines are not recommended for lengthy use. They can be habit forming.  You can promote easier sleeping by making lifestyle changes such as:  Using relaxation techniques that help with breathing and reduce muscle tension.  Exercising earlier in the day.  Changing your diet and the time of your last meal. No night time snacks.  Establish a regular time to go to bed.  Counseling can help with stressful problems and worry.  Soothing music and white noise may be helpful if there are background noises you cannot remove.  Stop tedious detailed work at least one hour before bedtime. HOME CARE INSTRUCTIONS   Keep a diary. Inform your caregiver about your progress. This includes any medication side effects. See your caregiver regularly. Take note of:  Times when you are asleep.  Times when you are awake during the night.  The quality of your sleep.  How you feel the next day. This information will help your caregiver care for you.  Get out of bed if you are still awake after 15 minutes. Read or do some quiet activity. Keep the lights down. Wait until you feel sleepy and go back to bed.  Keep regular sleeping and waking hours. Avoid naps.  Exercise regularly.  Avoid distractions at bedtime. Distractions include watching television or engaging in any intense or detailed activity like attempting to balance the household checkbook.  Develop a bedtime ritual. Keep a familiar routine of bathing, brushing your teeth, climbing into bed at the same   time each night, listening to soothing music. Routines increase the success of falling to sleep faster.  Use relaxation techniques. This can be using breathing and muscle tension release routines. It can also include visualizing peaceful scenes. You can  also help control troubling or intruding thoughts by keeping your mind occupied with boring or repetitive thoughts like the old concept of counting sheep. You can make it more creative like imagining planting one beautiful flower after another in your backyard garden.  During your day, work to eliminate stress. When this is not possible use some of the previous suggestions to help reduce the anxiety that accompanies stressful situations. MAKE SURE YOU:   Understand these instructions.  Will watch your condition.  Will get help right away if you are not doing well or get worse. Document Released: 10/06/2000 Document Revised: 01/01/2012 Document Reviewed: 11/06/2007 ExitCare Patient Information 2015 [employer]. This information is not intended to replace advice given to you by your health care provider. Make sure you discuss any questions you have with your health care provider.  

## 2015-09-20 ENCOUNTER — Other Ambulatory Visit: Payer: Self-pay | Admitting: Family Medicine

## 2015-09-20 NOTE — Telephone Encounter (Signed)
Pt request DIAZEPAM 5MG   Pt last visit 07/07/15 Last Rx refill 07/20/15 #60 with 3 refills   Please advise

## 2015-09-21 NOTE — Telephone Encounter (Signed)
Refill OK

## 2015-10-18 ENCOUNTER — Other Ambulatory Visit: Payer: Self-pay | Admitting: Family Medicine

## 2016-01-13 ENCOUNTER — Telehealth: Payer: Self-pay | Admitting: Family Medicine

## 2016-01-13 MED ORDER — CLOTRIMAZOLE-BETAMETHASONE 1-0.05 % EX CREA: TOPICAL_CREAM | CUTANEOUS | Status: DC

## 2016-01-13 NOTE — Telephone Encounter (Signed)
Medication refilled and send to pharmacy of choice.

## 2016-01-13 NOTE — Telephone Encounter (Signed)
OK to refill

## 2016-01-13 NOTE — Telephone Encounter (Signed)
Pt states she gets a rash on her hands during the winter. Dr Scotty CourtStafford had prescribed clotrimazole-betamethasone (LOTRISONE) cream [[id number]][[id number]]  Pt would like to know if Dr Caryl NeverBurchette would send this Rx in for her to  [facility]

## 2016-01-13 NOTE — Telephone Encounter (Signed)
No pending appt Last follow up 07/07/2015 Please advise Last refill was 2012

## 2016-02-12 ENCOUNTER — Other Ambulatory Visit: Payer: Self-pay | Admitting: Family Medicine

## 2016-03-28 ENCOUNTER — Telehealth: Payer: Self-pay | Admitting: Family Medicine

## 2016-03-28 ENCOUNTER — Other Ambulatory Visit: Payer: Self-pay | Admitting: Family Medicine

## 2016-03-28 MED ORDER — DIAZEPAM 5 MG PO TABS: 5.0000 mg | ORAL_TABLET | Freq: Every evening | ORAL | Status: DC | PRN

## 2016-03-28 NOTE — Telephone Encounter (Signed)
Pt request refill  diazepam (VALIUM) 5 MG tablet 60 tabs ok or a 90 day  [facility], [place] , [place]

## 2016-03-28 NOTE — Telephone Encounter (Signed)
Verbally called in for patient.  

## 2016-05-03 ENCOUNTER — Other Ambulatory Visit: Payer: BLUE CROSS/BLUE SHIELD

## 2016-05-05 ENCOUNTER — Other Ambulatory Visit (INDEPENDENT_AMBULATORY_CARE_PROVIDER_SITE_OTHER): Payer: BLUE CROSS/BLUE SHIELD

## 2016-05-05 DIAGNOSIS — Z0000 Encounter for general adult medical examination without abnormal findings: Secondary | ICD-10-CM | POA: Diagnosis not present

## 2016-05-05 LAB — LIPID PANEL
Cholesterol: 220 mg/dL — ABNORMAL HIGH (ref 0–200)
HDL: 73.4 mg/dL (ref >39.00)
LDL Cholesterol: 135 mg/dL — ABNORMAL HIGH (ref 0–99)
NonHDL: 146.47
Total CHOL/HDL Ratio: 3
Triglycerides: 58 mg/dL (ref 0.0–149.0)
VLDL: 11.6 mg/dL (ref 0.0–40.0)

## 2016-05-05 LAB — CBC WITH DIFFERENTIAL/PLATELET
Basophils Absolute: 0.1 10*3/uL (ref 0.0–0.1)
Basophils Relative: 1.1 % (ref 0.0–3.0)
Eosinophils Absolute: 0.4 10*3/uL (ref 0.0–0.7)
Eosinophils Relative: 6.7 % — ABNORMAL HIGH (ref 0.0–5.0)
HCT: 41.5 % (ref 36.0–46.0)
Hemoglobin: 14.1 g/dL (ref 12.0–15.0)
Lymphocytes Relative: 30.5 % (ref 12.0–46.0)
Lymphs Abs: 1.7 10*3/uL (ref 0.7–4.0)
MCHC: 33.9 g/dL (ref 30.0–36.0)
MCV: 86.2 fl (ref 78.0–100.0)
Monocytes Absolute: 0.6 10*3/uL (ref 0.1–1.0)
Monocytes Relative: 10.7 % (ref 3.0–12.0)
Neutro Abs: 2.8 10*3/uL (ref 1.4–7.7)
Neutrophils Relative %: 51 % (ref 43.0–77.0)
Platelets: 226 10*3/uL (ref 150.0–400.0)
RBC: 4.81 Mil/uL (ref 3.87–5.11)
RDW: 13.9 % (ref 11.5–15.5)
WBC: 5.6 10*3/uL (ref 4.0–10.5)

## 2016-05-05 LAB — BASIC METABOLIC PANEL
BUN: 16 mg/dL (ref 6–23)
CO2: 27 mEq/L (ref 19–32)
Calcium: 9.4 mg/dL (ref 8.4–10.5)
Chloride: 105 mEq/L (ref 96–112)
Creatinine, Ser: 0.81 mg/dL (ref 0.40–1.20)
GFR: 74.16 mL/min (ref >60.00)
Glucose, Bld: 92 mg/dL (ref 70–99)
Potassium: 4 mEq/L (ref 3.5–5.1)
Sodium: 141 mEq/L (ref 135–145)

## 2016-05-05 LAB — HEPATIC FUNCTION PANEL
ALT: 14 U/L (ref 0–35)
AST: 26 U/L (ref 0–37)
Albumin: 4.4 g/dL (ref 3.5–5.2)
Alkaline Phosphatase: 44 U/L (ref 39–117)
Bilirubin, Direct: 0.1 mg/dL (ref 0.0–0.3)
Total Bilirubin: 0.6 mg/dL (ref 0.2–1.2)
Total Protein: 7.1 g/dL (ref 6.0–8.3)

## 2016-05-05 LAB — TSH: TSH: 1.39 u[IU]/mL (ref 0.35–4.50)

## 2016-05-09 ENCOUNTER — Ambulatory Visit (INDEPENDENT_AMBULATORY_CARE_PROVIDER_SITE_OTHER): Payer: BLUE CROSS/BLUE SHIELD | Admitting: Family Medicine

## 2016-05-09 VITALS — BP 120/84 | HR 88 | Temp 98.4°F | Ht 63.75 in | Wt 137.2 lb

## 2016-05-09 DIAGNOSIS — Z0000 Encounter for general adult medical examination without abnormal findings: Secondary | ICD-10-CM | POA: Diagnosis not present

## 2016-05-09 NOTE — Progress Notes (Signed)
Subjective:     Patient ID: Kim ChuteBarbara F Daniel, female   DOB: December 23, 1945, 70 y.o.   MRN: [id number]  HPI Patient seen for physical exam She sees gynecologist and plans to see them in August. She has history of migraine headaches, irritable bowel syndrome, GERD, past history of depression. Currently is doing well. She is still working 4 days per week. Not exercising much. His had some mild weight gain this year. Quit smoking about 17 years ago.  Immunizations up-to-date. She plans to get bone density scan and mammogram through gynecologist later this summer. Colonoscopy up-to-date. No risk factors for hepatitis C.  Past Medical History  Diagnosis Date  . GERD (gastroesophageal reflux disease)   . History of migraine headaches   . Anxiety and depression   . Ureteral obstruction, right   . Seasonal allergic rhinitis   . Anxiety   . Depression   . History of hiatal hernia   . Chronic constipation   . SUI (stress urinary incontinence, female)   . Wears contact lenses    Past Surgical History  Procedure Laterality Date  . Total hip arthroplasty  03/03/2012    Procedure: TOTAL HIP ARTHROPLASTY;  Surgeon: Kim PalMatthew D Olin, MD;  Location: [facility];  Service: Orthopedics;  Laterality: Left;  . Esophagogastroduodenoscopy  04-02-2002  . Anterior cervical decomp/discectomy fusion  03-10-2004    C4 -- C6  . Colonoscopy with propofol  03-08-2009  . Vaginal hysterectomy  1976  . Tubal ligation  1971  . Cystoscopy w/ ureteral stent placement Right 10/02/2014    Procedure: CYSTOSCOPY WITH RIGHT RETROGRADE PYELOGRAM , URETERAL STENT PLACEMENT;  Surgeon: Kim FarmMark C Ottelin, MD;  [facility];  Service: Urology;  Laterality: Right;    reports that she quit smoking about 16 years ago. Her smoking use included Cigarettes. She has a 10 pack-year smoking history. She has never used smokeless tobacco. She reports that she drinks about 0.6 oz of alcohol per week. She reports that she does not  use illicit drugs. family history includes Cancer (age of onset: 2558) in her father; Cirrhosis in her mother. Allergies  Allergen Reactions  . Hydrocodone Other (See Comments)    "get dizzy and spaced out"      Review of Systems  Constitutional: Negative for fever, activity change, appetite change, fatigue and unexpected weight change.  HENT: Negative for ear pain, hearing loss, sore throat and trouble swallowing.   Eyes: Negative for visual disturbance.  Respiratory: Negative for cough and shortness of breath.   Cardiovascular: Negative for chest pain and palpitations.  Gastrointestinal: Negative for nausea, vomiting, abdominal pain, diarrhea, constipation and blood in stool.  Endocrine: Negative for polydipsia and polyuria.  Genitourinary: Negative for dysuria and hematuria.  Musculoskeletal: Negative for myalgias, back pain and arthralgias.  Skin: Negative for rash.  Neurological: Negative for dizziness, syncope and headaches.  Hematological: Negative for adenopathy.  Psychiatric/Behavioral: Negative for confusion and dysphoric mood.       Objective:   Physical Exam  Constitutional: She is oriented to person, place, and time. She appears well-developed and well-nourished.  HENT:  Head: Normocephalic and atraumatic.  Eyes: EOM are normal. Pupils are equal, round, and reactive to light.  Neck: Normal range of motion. Neck supple. No thyromegaly present.  Cardiovascular: Normal rate, regular rhythm and normal heart sounds.  Exam reveals no gallop.   Pulmonary/Chest: Breath sounds normal. No respiratory distress. She has no wheezes. She has no rales.  Abdominal: Soft. Bowel sounds are normal.  She exhibits no distension and no mass. There is no tenderness. There is no rebound and no guarding.  Genitourinary:  Per gyn   Musculoskeletal: Normal range of motion. She exhibits no edema.  Lymphadenopathy:    She has no cervical adenopathy.  Neurological: She is alert and oriented to  person, place, and time. She displays normal reflexes. No cranial nerve deficit.  Skin: No rash noted.  Psychiatric: She has a normal mood and affect. Her behavior is normal. Judgment and thought content normal.       Assessment:     Physical exam. Immunizations up-to-date. Colonoscopy up-to-date. She's getting GYN care elsewhere    Plan:     -We discussed low-dose lung cancer CT scanning but she is not a candidate based on the fact that she quit over 17 years ago. Also probably less than 30 pack year history -Establish more consistent weightbearing exercise. -Continue daily calcium and vitamin D -Labs reviewed with no major concerns -Discussed hepatitis C screening and she is not interested. She has no specific risk factors.  Kim Covey MD  Primary Care at [facility]

## 2016-05-09 NOTE — Patient Instructions (Signed)
Continue with yearly flu vaccine. Get back to more frequent walking.

## 2016-05-09 NOTE — Progress Notes (Signed)
Pre visit review using our clinic review tool, if applicable. No additional management support is needed unless otherwise documented below in the visit note. 

## 2016-05-25 ENCOUNTER — Other Ambulatory Visit: Payer: Self-pay | Admitting: Family Medicine

## 2016-09-28 ENCOUNTER — Other Ambulatory Visit: Payer: Self-pay | Admitting: Family Medicine

## 2016-09-28 NOTE — Telephone Encounter (Signed)
Refills OK. 

## 2016-09-28 NOTE — Telephone Encounter (Signed)
Last OV 05-09-2016  Last refill 03/28/2016 #60, 3rf Please advise

## 2017-03-02 ENCOUNTER — Other Ambulatory Visit: Payer: Self-pay | Admitting: Family Medicine

## 2017-04-02 ENCOUNTER — Other Ambulatory Visit: Payer: Self-pay | Admitting: Family Medicine

## 2017-04-03 NOTE — Telephone Encounter (Signed)
Last refill 09/28/16 and last office visit 05/09/16.  Okay to fill?

## 2017-04-03 NOTE — Telephone Encounter (Signed)
Rx called in and an appointment made

## 2017-04-03 NOTE — Telephone Encounter (Signed)
Refill for one month. Needs office followup 

## 2017-05-08 ENCOUNTER — Other Ambulatory Visit: Payer: Self-pay | Admitting: Family Medicine

## 2017-05-08 MED ORDER — SUMATRIPTAN SUCCINATE 100 MG PO TABS: ORAL_TABLET | ORAL | 0 refills | Status: DC

## 2017-05-08 NOTE — Addendum Note (Signed)
Addended by: Vito BackersOX, Reyann Troop H on: 05/08/2017 03:24 PM   Modules accepted: Orders

## 2017-05-08 NOTE — Telephone Encounter (Signed)
Pt need new Rx sumatriptan  Pharm:  [facility]

## 2017-05-08 NOTE — Addendum Note (Signed)
Addended by: Vito BackersOX, Anyelin Mogle H on: 05/08/2017 04:13 PM   Modules accepted: Orders

## 2017-05-08 NOTE — Telephone Encounter (Signed)
Rx sent to pharmacy   

## 2017-06-08 ENCOUNTER — Ambulatory Visit (INDEPENDENT_AMBULATORY_CARE_PROVIDER_SITE_OTHER): Payer: BLUE CROSS/BLUE SHIELD | Admitting: Family Medicine

## 2017-06-08 ENCOUNTER — Encounter: Payer: Self-pay | Admitting: Family Medicine

## 2017-06-08 VITALS — BP 110/70 | HR 69 | Temp 98.9°F | Wt 139.6 lb

## 2017-06-08 DIAGNOSIS — F5104 Psychophysiologic insomnia: Secondary | ICD-10-CM | POA: Diagnosis not present

## 2017-06-08 DIAGNOSIS — K219 Gastro-esophageal reflux disease without esophagitis: Secondary | ICD-10-CM | POA: Diagnosis not present

## 2017-06-08 DIAGNOSIS — J309 Allergic rhinitis, unspecified: Secondary | ICD-10-CM | POA: Diagnosis not present

## 2017-06-08 DIAGNOSIS — F341 Dysthymic disorder: Secondary | ICD-10-CM

## 2017-06-08 MED ORDER — ZOLPIDEM TARTRATE 5 MG PO TABS: 5.0000 mg | ORAL_TABLET | Freq: Every evening | ORAL | 1 refills | Status: DC | PRN

## 2017-06-08 MED ORDER — FLUTICASONE PROPIONATE 50 MCG/ACT NA SUSP: 2.0000 | Freq: Every day | NASAL | 3 refills | Status: DC

## 2017-06-08 MED ORDER — PANTOPRAZOLE SODIUM 40 MG PO TBEC: 40.0000 mg | DELAYED_RELEASE_TABLET | Freq: Every day | ORAL | 3 refills | Status: DC

## 2017-06-08 NOTE — Patient Instructions (Signed)

## 2017-06-08 NOTE — Progress Notes (Signed)
Subjective:     Patient ID: Kim Daniel, female   DOB: 02/21/1946, 71 y.o.   MRN: [id number]  HPI Patient seen for follow-up regarding multiple items.  History of recurrent depression. Stable on Celexa. She is anxious about coming off this at any point. It appears she's had more 3 flareups of depression in her lifetime and should probably be on indefinite use  Chronic insomnia. Sometimes has difficulty falling asleep. Has recently started using some melatonin which does help but still struggles at times. She has been for several years on low-dose diazepam 5 mg one half to one tablet daily at bedtime when necessary but states she usually only uses a couple times per week.  Long history of GERD. On Protonix 40 mg daily. She's had breakthrough symptoms when attempting to come off. No appetite or weight changes  Seasonal and perennial allergies. Requesting refills of Flonase.  Past Medical History:  Diagnosis Date  . Anxiety   . Anxiety and depression   . Chronic constipation   . Depression   . GERD (gastroesophageal reflux disease)   . History of hiatal hernia   . History of migraine headaches   . Seasonal allergic rhinitis   . SUI (stress urinary incontinence, female)   . Ureteral obstruction, right   . Wears contact lenses    Past Surgical History:  Procedure Laterality Date  . ANTERIOR CERVICAL DECOMP/DISCECTOMY FUSION  03-10-2004   C4 -- C6  . COLONOSCOPY WITH PROPOFOL  03-08-2009  . CYSTOSCOPY W/ URETERAL STENT PLACEMENT Right 10/02/2014   Procedure: CYSTOSCOPY WITH RIGHT RETROGRADE PYELOGRAM , URETERAL STENT PLACEMENT;  Surgeon: Garnett Farm, MD;  Location: [facility];  Service: Urology;  Laterality: Right;  . ESOPHAGOGASTRODUODENOSCOPY  04-02-2002  . TOTAL HIP ARTHROPLASTY  03/03/2012   Procedure: TOTAL HIP ARTHROPLASTY;  Surgeon: [name], MD;  Location: [facility];  Service: Orthopedics;  Laterality: Left;  . TUBAL LIGATION  1971  . VAGINAL  HYSTERECTOMY  1976    reports that she quit smoking about 17 years ago. Her smoking use included Cigarettes. She has a 10.00 pack-year smoking history. She has never used smokeless tobacco. She reports that she drinks about 0.6 oz of alcohol per week . She reports that she does not use drugs. family history includes Cancer (age of onset: 26) in her father; Cirrhosis in her mother. Allergies  Allergen Reactions  . Hydrocodone Other (See Comments)    "get dizzy and spaced out"     Review of Systems  Constitutional: Negative for fatigue.  Eyes: Negative for visual disturbance.  Respiratory: Negative for cough, chest tightness, shortness of breath and wheezing.   Cardiovascular: Negative for chest pain, palpitations and leg swelling.  Endocrine: Negative for polydipsia and polyuria.  Neurological: Negative for dizziness, seizures, syncope, weakness, light-headedness and headaches.       Objective:   Physical Exam  Constitutional: She appears well-developed and well-nourished.  Neck: Neck supple. No thyromegaly present.  Cardiovascular: Normal rate and regular rhythm.   Pulmonary/Chest: Effort normal and breath sounds normal. No respiratory distress. She has no wheezes. She has no rales.  Lymphadenopathy:    She has no cervical adenopathy.       Assessment:     #1 GERD stable on Protonix  #2 chronic insomnia  #3 seasonal and perennial allergies  #4 history of recurrent depression stable on Celexa    Plan:     -refills of Flonase and Protonix were given today -We discussed  benzodiazepine use. We have recommend she try to get off diazepam completely given increase fall risk with aging and possible association with cognitive impairment -Sleep hygiene discussed. Continue with melatonin as needed. -Wrote for limited Ambien 5 mg 1 daily at bedtime for severe insomnia -Follow-up in one year and sooner as needed  Kristian Covey MD [place] [facility] at [facility]

## 2017-06-11 ENCOUNTER — Telehealth: Payer: Self-pay | Admitting: Family Medicine

## 2017-06-11 NOTE — Telephone Encounter (Signed)
Pt calling stating that she would like to stay on the valium instead of Ambiem and would like to return the Ambiem prescription and would like to pick up the Valium prescription.

## 2017-06-11 NOTE — Telephone Encounter (Signed)
D/C Ambien.  May refill the Diazepam but would really have her try to minimize use as much as possible- for reasons discussed at her visit.

## 2017-06-12 MED ORDER — DIAZEPAM 5 MG PO TABS: 5.0000 mg | ORAL_TABLET | Freq: Every evening | ORAL | 3 refills | Status: DC | PRN

## 2017-06-12 NOTE — Telephone Encounter (Signed)
Pt following up on this refill request. Pt would like the  Medication  diazepam (VALIUM) 5 MG tablet [[id number]]  Called into   [address] [facility] - [place],  - [place], [place] - [address]

## 2017-06-12 NOTE — Telephone Encounter (Signed)
Rx called in med list updated.

## 2017-06-27 ENCOUNTER — Other Ambulatory Visit: Payer: Self-pay | Admitting: Obstetrics and Gynecology

## 2017-06-27 DIAGNOSIS — N644 Mastodynia: Secondary | ICD-10-CM

## 2017-07-02 ENCOUNTER — Ambulatory Visit
Admission: RE | Admit: 2017-07-02 | Discharge: 2017-07-02 | Disposition: A | Discharge status: Home / Self Care | Payer: BLUE CROSS/BLUE SHIELD | Source: Ambulatory Visit | Attending: Obstetrics and Gynecology | Admitting: Obstetrics and Gynecology

## 2017-07-02 DIAGNOSIS — N644 Mastodynia: Secondary | ICD-10-CM

## 2017-07-12 ENCOUNTER — Encounter: Payer: Self-pay | Admitting: Family Medicine

## 2017-07-24 ENCOUNTER — Other Ambulatory Visit: Payer: Self-pay | Admitting: Family Medicine

## 2017-09-12 ENCOUNTER — Other Ambulatory Visit: Payer: Self-pay | Admitting: Neurosurgery

## 2017-09-12 DIAGNOSIS — Z981 Arthrodesis status: Secondary | ICD-10-CM

## 2017-09-20 ENCOUNTER — Ambulatory Visit
Admission: RE | Admit: 2017-09-20 | Discharge: 2017-09-20 | Disposition: A | Discharge status: Home / Self Care | Payer: BLUE CROSS/BLUE SHIELD | Source: Ambulatory Visit | Attending: Neurosurgery | Admitting: Neurosurgery

## 2017-09-20 ENCOUNTER — Other Ambulatory Visit: Payer: Self-pay | Admitting: Neurosurgery

## 2017-09-20 DIAGNOSIS — Z981 Arthrodesis status: Secondary | ICD-10-CM

## 2017-09-20 MED ORDER — IOPAMIDOL (ISOVUE-M 300) INJECTION 61%
1.0000 mL | Freq: Once | INTRAMUSCULAR | Status: AC | PRN
  Administered 2017-09-20: 1 mL via INTRA_ARTICULAR

## 2017-09-20 MED ORDER — DEXAMETHASONE SODIUM PHOSPHATE 4 MG/ML IJ SOLN
5.0000 mg | Freq: Once | INTRAMUSCULAR | Status: AC
  Administered 2017-09-20: 5.2 mg via INTRA_ARTICULAR

## 2017-09-20 MED ORDER — DEXAMETHASONE SODIUM PHOSPHATE 4 MG/ML IJ SOLN: 5.0000 mg | Freq: Once | INTRAMUSCULAR | Status: DC

## 2017-09-20 NOTE — Discharge Instructions (Signed)

## 2017-11-04 ENCOUNTER — Other Ambulatory Visit: Payer: Self-pay | Admitting: Family Medicine

## 2017-11-05 NOTE — Telephone Encounter (Signed)
Clarify.  Her med list has Diazepam

## 2017-11-05 NOTE — Telephone Encounter (Signed)
Last refill given at office visit 06/08/17. Wrote for limited Ambien 5 mg 1 daily at bedtime for severe insomnia Okay to refill?

## 2017-11-06 NOTE — Telephone Encounter (Signed)
Patient has tried the Diazepm but it does not work as well.  She is using Ambien but not every night.  Okay to fill?

## 2017-11-06 NOTE — Telephone Encounter (Signed)
See note from 06-08-17.  Did she mean the Diazepam is working better??  She had been on Diazepam (for years) and that is what we were recommending she come off of.  If the Ambien is working better that is great.  Take the Diazepam off the list and may refill Ambien.

## 2017-12-31 ENCOUNTER — Other Ambulatory Visit: Payer: Self-pay | Admitting: Family Medicine

## 2017-12-31 MED ORDER — ZOLPIDEM TARTRATE 5 MG PO TABS: 5.0000 mg | ORAL_TABLET | Freq: Every evening | ORAL | 2 refills | Status: DC | PRN

## 2017-12-31 NOTE — Telephone Encounter (Signed)
Refill with 2 additional refills. 

## 2017-12-31 NOTE — Telephone Encounter (Signed)
LOV: 06/08/17  Dr. Caryl NeverBurchette   [facility] [address]

## 2017-12-31 NOTE — Telephone Encounter (Signed)
Copied from CRM [id number]. Topic: Quick Communication - Rx Refill/Question >> Dec 31, 2017 10:16 AM Lelon FrohlichGolden, Tashia, RMA wrote: Medication: ambien 5 mg   Has the patient contacted their pharmacy? yes   (Agent: If no, request that the patient contact the pharmacy for the refill.)   Preferred Pharmacy (with phone number or street name): [facility] [address]   Agent: Please be advised that RX refills may take up to 3 business days. We ask that you follow-up with your pharmacy.

## 2018-01-18 ENCOUNTER — Encounter: Payer: Self-pay | Admitting: Family Medicine

## 2018-01-18 DIAGNOSIS — Z0289 Encounter for other administrative examinations: Secondary | ICD-10-CM

## 2018-01-24 ENCOUNTER — Other Ambulatory Visit: Payer: Self-pay | Admitting: Family Medicine

## 2018-05-01 ENCOUNTER — Encounter: Payer: Self-pay | Admitting: Family Medicine

## 2018-05-01 ENCOUNTER — Ambulatory Visit (INDEPENDENT_AMBULATORY_CARE_PROVIDER_SITE_OTHER): Payer: Managed Care, Other (non HMO) | Admitting: Family Medicine

## 2018-05-01 VITALS — BP 102/78 | HR 76 | Temp 98.8°F | Wt 136.1 lb

## 2018-05-01 DIAGNOSIS — R1310 Dysphagia, unspecified: Secondary | ICD-10-CM | POA: Diagnosis not present

## 2018-05-01 DIAGNOSIS — M545 Low back pain, unspecified: Secondary | ICD-10-CM

## 2018-05-01 DIAGNOSIS — F5102 Adjustment insomnia: Secondary | ICD-10-CM | POA: Diagnosis not present

## 2018-05-01 DIAGNOSIS — G8929 Other chronic pain: Secondary | ICD-10-CM

## 2018-05-01 MED ORDER — ZOLPIDEM TARTRATE 5 MG PO TABS: 5.0000 mg | ORAL_TABLET | Freq: Every evening | ORAL | 2 refills | Status: DC | PRN

## 2018-05-01 NOTE — Patient Instructions (Addendum)

## 2018-05-01 NOTE — Progress Notes (Signed)
Subjective:     Patient ID: Kim Daniel, female   DOB: 04-05-1946, 72 y.o.   MRN: [id number]  HPI Patient here to discuss several items  Right low back pain. Intermittent for several years. She has seen neurosurgeon previously. She had MRI scan couple years ago which showed degenerative changes mostly L4-L5 and L5-S1. Pain is very intermittent. She had evidence for possible UPJ obstruction by previous MRI and was seen by urology and they did not feel she had any major obstruction issues or acute problem. She describes a dull ache to soreness which is worse after prolonged periods of sitting and worse with movement. No radiculitis symptoms. Pain sometimes radiates slightly anterior. She's had previous colonoscopy and is due for follow-up by next year. Her pain is confined mostly lower lumbar region. No appetite or weight changes.  She has long-standing history of reflux which is controlled Protonix. She's had 2 episodes over the past month where she was eating solid foods and had difficulty with food not wanting go down and this happened once with a tablet. No appetite changes or weight loss. No pain with swallowing.  Patient requesting refill Ambien. Takes this as needed for insomnia.  She tries to avoid regular use.  Past Medical History:  Diagnosis Date  . Anxiety   . Anxiety and depression   . Chronic constipation   . Depression   . GERD (gastroesophageal reflux disease)   . History of hiatal hernia   . History of migraine headaches   . Seasonal allergic rhinitis   . SUI (stress urinary incontinence, female)   . Ureteral obstruction, right   . Wears contact lenses    Past Surgical History:  Procedure Laterality Date  . ANTERIOR CERVICAL DECOMP/DISCECTOMY FUSION  03-10-2004   C4 -- C6  . BREAST BIOPSY Right 1980  . BREAST EXCISIONAL BIOPSY Right 1980  . COLONOSCOPY WITH PROPOFOL  03-08-2009  . CYSTOSCOPY W/ URETERAL STENT PLACEMENT Right 10/02/2014   Procedure: CYSTOSCOPY  WITH RIGHT RETROGRADE PYELOGRAM , URETERAL STENT PLACEMENT;  Surgeon: Garnett FarmMark C Ottelin, MD;  [facility];  Service: Urology;  Laterality: Right;  . ESOPHAGOGASTRODUODENOSCOPY  04-02-2002  . TOTAL HIP ARTHROPLASTY  03/03/2012   Procedure: TOTAL HIP ARTHROPLASTY;  Surgeon: Shelda PalMatthew D Olin, MD;  Location: [facility];  Service: Orthopedics;  Laterality: Left;  . TUBAL LIGATION  1971  . VAGINAL HYSTERECTOMY  1976    reports that she quit smoking about 18 years ago. Her smoking use included cigarettes. She has a 10.00 pack-year smoking history. She has never used smokeless tobacco. She reports that she drinks about 0.6 oz of alcohol per week. She reports that she does not use drugs. family history includes Breast cancer in her maternal uncle; Cancer (age of onset: 4358) in her father; Cirrhosis in her mother. Allergies  Allergen Reactions  . Hydrocodone Nausea And Vomiting and Other (See Comments)    "get dizzy and spaced out"     Review of Systems  Constitutional: Negative for appetite change, fatigue and unexpected weight change.  HENT: Positive for trouble swallowing.   Eyes: Negative for visual disturbance.  Respiratory: Negative for cough, chest tightness, shortness of breath and wheezing.   Cardiovascular: Negative for chest pain, palpitations and leg swelling.  Gastrointestinal: Negative for abdominal pain.  Genitourinary: Negative for dysuria.  Musculoskeletal: Positive for back pain.  Neurological: Negative for dizziness, seizures, syncope, weakness, light-headedness and headaches.       Objective:   Physical Exam  Constitutional: She appears well-developed and well-nourished.  HENT:  Mouth/Throat: Oropharynx is clear and moist.  Neck: Neck supple. No thyromegaly present.  Cardiovascular: Normal rate and regular rhythm.  Pulmonary/Chest: Effort normal and breath sounds normal.  Abdominal: Soft. Bowel sounds are normal. She exhibits no mass. There is no  tenderness.  Musculoskeletal: She exhibits no edema.  Lymphadenopathy:    She has no cervical adenopathy.  Neurological:  Full-strength lower extremities. Symmetric reflexes throughout       Assessment:     #1 right lower lumbar back pain. Suspect degenerative arthritis which has been shown on previous x-rays. Nonfocal neuro exam and symptoms are very intermittent  #2 intermittent solid food dysphagia. No red flags such as appetite or weight changes. She's had 2 episodes thus far. She does have reflux which is well-controlled  #3 transient insomnia    Plan:     -Refilled Ambien but avoid regular use -We discussed possible GI referral regarding her dysphagia. At this point she wishes to wait. Eat slowly and chew foods  well. Avoid hard to chew foods such as steak. If she has further recurrences will recommend referral for possible EGD -Discussed management of her back pain. She has history of known degenerative arthritis. Consider physical therapy- and at this point she wishes to wait. -Set up complete physical  Kristian Covey MD [place] [facility] at [facility]

## 2018-05-17 ENCOUNTER — Ambulatory Visit (INDEPENDENT_AMBULATORY_CARE_PROVIDER_SITE_OTHER): Payer: Managed Care, Other (non HMO) | Admitting: Family Medicine

## 2018-05-17 ENCOUNTER — Encounter: Payer: Self-pay | Admitting: Family Medicine

## 2018-05-17 VITALS — BP 118/70 | HR 68 | Temp 98.4°F | Ht 63.75 in | Wt 137.1 lb

## 2018-05-17 DIAGNOSIS — Z0000 Encounter for general adult medical examination without abnormal findings: Secondary | ICD-10-CM

## 2018-05-17 LAB — CBC WITH DIFFERENTIAL/PLATELET
Basophils Absolute: 0.1 10*3/uL (ref 0.0–0.1)
Basophils Relative: 1.2 % (ref 0.0–3.0)
Eosinophils Absolute: 0.3 10*3/uL (ref 0.0–0.7)
Eosinophils Relative: 4.7 % (ref 0.0–5.0)
HCT: 40.5 % (ref 36.0–46.0)
Hemoglobin: 13.7 g/dL (ref 12.0–15.0)
Lymphocytes Relative: 34.1 % (ref 12.0–46.0)
Lymphs Abs: 2 10*3/uL (ref 0.7–4.0)
MCHC: 33.9 g/dL (ref 30.0–36.0)
MCV: 86.6 fl (ref 78.0–100.0)
Monocytes Absolute: 0.6 10*3/uL (ref 0.1–1.0)
Monocytes Relative: 10 % (ref 3.0–12.0)
Neutro Abs: 2.9 10*3/uL (ref 1.4–7.7)
Neutrophils Relative %: 50 % (ref 43.0–77.0)
Platelets: 242 10*3/uL (ref 150.0–400.0)
RBC: 4.68 Mil/uL (ref 3.87–5.11)
RDW: 13.8 % (ref 11.5–15.5)
WBC: 5.9 10*3/uL (ref 4.0–10.5)

## 2018-05-17 LAB — HEPATIC FUNCTION PANEL
ALT: 10 U/L (ref 0–35)
AST: 19 U/L (ref 0–37)
Albumin: 4.5 g/dL (ref 3.5–5.2)
Alkaline Phosphatase: 40 U/L (ref 39–117)
Bilirubin, Direct: 0.1 mg/dL (ref 0.0–0.3)
Total Bilirubin: 0.7 mg/dL (ref 0.2–1.2)
Total Protein: 6.7 g/dL (ref 6.0–8.3)

## 2018-05-17 LAB — BASIC METABOLIC PANEL
BUN: 16 mg/dL (ref 6–23)
CO2: 28 mEq/L (ref 19–32)
Calcium: 9.3 mg/dL (ref 8.4–10.5)
Chloride: 104 mEq/L (ref 96–112)
Creatinine, Ser: 0.81 mg/dL (ref 0.40–1.20)
GFR: 73.74 mL/min (ref >60.00)
Glucose, Bld: 98 mg/dL (ref 70–99)
Potassium: 4 mEq/L (ref 3.5–5.1)
Sodium: 140 mEq/L (ref 135–145)

## 2018-05-17 LAB — LIPID PANEL
Cholesterol: 226 mg/dL — ABNORMAL HIGH (ref 0–200)
HDL: 79.2 mg/dL (ref >39.00)
LDL Cholesterol: 132 mg/dL — ABNORMAL HIGH (ref 0–99)
NonHDL: 147.05
Total CHOL/HDL Ratio: 3
Triglycerides: 74 mg/dL (ref 0.0–149.0)
VLDL: 14.8 mg/dL (ref 0.0–40.0)

## 2018-05-17 LAB — TSH: TSH: 1.39 u[IU]/mL (ref 0.35–4.50)

## 2018-05-17 NOTE — Patient Instructions (Signed)
Consider tapering back Protonix as discussed

## 2018-05-17 NOTE — Progress Notes (Signed)
Subjective:     Patient ID: Kim ChuteBarbara F Daniel, female   DOB: 1946/05/15, 72 y.o.   MRN: [id number]  HPI Patient seen for physical exam and well visit.  Her chronic problems include history of GERD, migraine headaches, osteopenia. She is followed by gynecologist yearly for mammograms. She is also getting regular DEXA scans through their office.  No recurrent dysphagia since last visit. No active GERD symptoms. Her back pain is also improved after increased ambulation  Health maintenance reviewed. Immunizations up-to-date. She gets yearly flu vaccine. Will be due for tetanus next year. Needs repeat colonoscopy by January 2021  Past Medical History:  Diagnosis Date  . Anxiety   . Anxiety and depression   . Chronic constipation   . Depression   . GERD (gastroesophageal reflux disease)   . History of hiatal hernia   . History of migraine headaches   . Seasonal allergic rhinitis   . SUI (stress urinary incontinence, female)   . Ureteral obstruction, right   . Wears contact lenses    Past Surgical History:  Procedure Laterality Date  . ANTERIOR CERVICAL DECOMP/DISCECTOMY FUSION  03-10-2004   C4 -- C6  . BREAST BIOPSY Right 1980  . BREAST EXCISIONAL BIOPSY Right 1980  . COLONOSCOPY WITH PROPOFOL  03-08-2009  . CYSTOSCOPY W/ URETERAL STENT PLACEMENT Right 10/02/2014   Procedure: CYSTOSCOPY WITH RIGHT RETROGRADE PYELOGRAM , URETERAL STENT PLACEMENT;  Surgeon: Garnett FarmMark C Ottelin, MD;  [facility];  Service: Urology;  Laterality: Right;  . ESOPHAGOGASTRODUODENOSCOPY  04-02-2002  . TOTAL HIP ARTHROPLASTY  03/03/2012   Procedure: TOTAL HIP ARTHROPLASTY;  Surgeon: Shelda PalMatthew D Olin, MD;  Location: [facility];  Service: Orthopedics;  Laterality: Left;  . TUBAL LIGATION  1971  . VAGINAL HYSTERECTOMY  1976    reports that she quit smoking about 18 years ago. Her smoking use included cigarettes. She has a 10.00 pack-year smoking history. She has never used smokeless tobacco. She  reports that she drinks about 0.6 oz of alcohol per week. She reports that she does not use drugs. family history includes Breast cancer in her maternal uncle; Cancer (age of onset: 3558) in her father; Cirrhosis in her mother. Allergies  Allergen Reactions  . Hydrocodone Nausea And Vomiting and Other (See Comments)    "get dizzy and spaced out" Other reaction(s): Other (See Comments) "get dizzy and spaced out"     Review of Systems  Constitutional: Negative for activity change, appetite change, fatigue, fever and unexpected weight change.  HENT: Negative for ear pain, hearing loss, sore throat and trouble swallowing.   Eyes: Negative for visual disturbance.  Respiratory: Negative for cough and shortness of breath.   Cardiovascular: Negative for chest pain and palpitations.  Gastrointestinal: Negative for abdominal pain, blood in stool, constipation and diarrhea.  Endocrine: Negative for polydipsia and polyuria.  Genitourinary: Negative for dysuria and hematuria.  Musculoskeletal: Negative for arthralgias, back pain and myalgias.  Skin: Negative for rash.  Neurological: Negative for dizziness, syncope and headaches.  Hematological: Negative for adenopathy.  Psychiatric/Behavioral: Negative for confusion and dysphoric mood.       Objective:   Physical Exam  Constitutional: She is oriented to person, place, and time. She appears well-developed and well-nourished.  HENT:  Head: Normocephalic and atraumatic.  Eyes: Pupils are equal, round, and reactive to light. EOM are normal.  Neck: Normal range of motion. Neck supple. No thyromegaly present.  Cardiovascular: Normal rate, regular rhythm and normal heart sounds.  No murmur heard. Pulmonary/Chest:  Breath sounds normal. No respiratory distress. She has no wheezes. She has no rales.  Abdominal: Soft. Bowel sounds are normal. She exhibits no distension and no mass. There is no tenderness. There is no rebound and no guarding.   Musculoskeletal: Normal range of motion. She exhibits no edema.  Lymphadenopathy:    She has no cervical adenopathy.  Neurological: She is alert and oriented to person, place, and time. She displays normal reflexes. No cranial nerve deficit.  Skin: No rash noted.  Psychiatric: She has a normal mood and affect. Her behavior is normal. Judgment and thought content normal.       Assessment:     Physical exam.  The following issues were addressed related to health maintenance.    Plan:     -continue with yearly flu vaccine -check screening labs -tetanus by next year -repeat colonoscopy 2021 -mammograms and DEXA per gyn.  Kristian Covey MD [facility] at [facility]

## 2018-05-19 ENCOUNTER — Encounter: Payer: Self-pay | Admitting: Family Medicine

## 2018-05-26 ENCOUNTER — Emergency Department (HOSPITAL_BASED_OUTPATIENT_CLINIC_OR_DEPARTMENT_OTHER)
Admission: EM | Admit: 2018-05-26 | Discharge: 2018-05-26 | Disposition: A | Discharge status: Home / Self Care | Payer: Managed Care, Other (non HMO) | Attending: Emergency Medicine | Admitting: Emergency Medicine

## 2018-05-26 ENCOUNTER — Other Ambulatory Visit: Payer: Self-pay

## 2018-05-26 ENCOUNTER — Encounter (HOSPITAL_BASED_OUTPATIENT_CLINIC_OR_DEPARTMENT_OTHER): Payer: Self-pay | Admitting: Emergency Medicine

## 2018-05-26 ENCOUNTER — Emergency Department (HOSPITAL_BASED_OUTPATIENT_CLINIC_OR_DEPARTMENT_OTHER): Payer: Managed Care, Other (non HMO)

## 2018-05-26 DIAGNOSIS — Z87891 Personal history of nicotine dependence: Secondary | ICD-10-CM | POA: Diagnosis not present

## 2018-05-26 DIAGNOSIS — Z79899 Other long term (current) drug therapy: Secondary | ICD-10-CM | POA: Diagnosis not present

## 2018-05-26 DIAGNOSIS — R1031 Right lower quadrant pain: Secondary | ICD-10-CM | POA: Insufficient documentation

## 2018-05-26 LAB — COMPREHENSIVE METABOLIC PANEL
ALT: 12 U/L (ref 0–44)
AST: 32 U/L (ref 15–41)
Albumin: 4.4 g/dL (ref 3.5–5.0)
Alkaline Phosphatase: 39 U/L (ref 38–126)
Anion gap: 10 (ref 5–15)
BUN: 14 mg/dL (ref 8–23)
CO2: 24 mmol/L (ref 22–32)
Calcium: 9.3 mg/dL (ref 8.9–10.3)
Chloride: 107 mmol/L (ref 98–111)
Creatinine, Ser: 0.76 mg/dL (ref 0.44–1.00)
GFR calc Af Amer: >60 mL/min (ref >60)
GFR calc non Af Amer: >60 mL/min (ref >60)
Glucose, Bld: 86 mg/dL (ref 70–99)
Potassium: 3.7 mmol/L (ref 3.5–5.1)
Sodium: 141 mmol/L (ref 135–145)
Total Bilirubin: 0.5 mg/dL (ref 0.3–1.2)
Total Protein: 7 g/dL (ref 6.5–8.1)

## 2018-05-26 LAB — URINALYSIS, ROUTINE W REFLEX MICROSCOPIC
Bilirubin Urine: NEGATIVE
Glucose, UA: NEGATIVE mg/dL
Hgb urine dipstick: NEGATIVE
Ketones, ur: NEGATIVE mg/dL
Leukocytes, UA: NEGATIVE
Nitrite: NEGATIVE
Protein, ur: NEGATIVE mg/dL
Specific Gravity, Urine: <1.005 — ABNORMAL LOW (ref 1.005–1.030)
pH: 6 (ref 5.0–8.0)

## 2018-05-26 LAB — CBC
HCT: 40.2 % (ref 36.0–46.0)
Hemoglobin: 13.8 g/dL (ref 12.0–15.0)
MCH: 29.6 pg (ref 26.0–34.0)
MCHC: 34.3 g/dL (ref 30.0–36.0)
MCV: 86.3 fL (ref 78.0–100.0)
Platelets: 234 10*3/uL (ref 150–400)
RBC: 4.66 MIL/uL (ref 3.87–5.11)
RDW: 13.6 % (ref 11.5–15.5)
WBC: 6.6 10*3/uL (ref 4.0–10.5)

## 2018-05-26 LAB — LIPASE, BLOOD: Lipase: 50 U/L (ref 11–51)

## 2018-05-26 MED ORDER — ONDANSETRON HCL 4 MG/2ML IJ SOLN
4.0000 mg | Freq: Once | INTRAMUSCULAR | Status: AC
  Administered 2018-05-26: 4 mg via INTRAVENOUS
  Filled 2018-05-26: qty 2

## 2018-05-26 MED ORDER — TRAMADOL HCL 50 MG PO TABS: 50.0000 mg | ORAL_TABLET | Freq: Four times a day (QID) | ORAL | 0 refills | Status: DC | PRN

## 2018-05-26 MED ORDER — FENTANYL CITRATE (PF) 100 MCG/2ML IJ SOLN
50.0000 ug | Freq: Once | INTRAMUSCULAR | Status: AC
  Administered 2018-05-26: 50 ug via INTRAVENOUS
  Filled 2018-05-26: qty 2

## 2018-05-26 NOTE — ED Provider Notes (Signed)
[facility] EMERGENCY DEPARTMENT Provider Note   CSN: [id number] Arrival date & time: 05/26/18  1028     History   Chief Complaint Chief Complaint  Patient presents with  . Abdominal Pain    HPI Kim Daniel is a 72 y.o. female.  Patient is a 72 year old female who presents with abdominal pain.  She states is been hurting since yesterday.  She bent over and had sudden pain in her right lower abdomen.  It radiates around to her right lower back.  She denies any difficulty urinating.  No change in bowel habits.  No nausea or vomiting.  No known fevers although she has had some chills.  No other injuries to the area.  She had a history of some similar pain a while back and was sent to urology.  There is a question of some problem with her kidney but she is not sure if this is related.     Past Medical History:  Diagnosis Date  . Anxiety   . Anxiety and depression   . Chronic constipation   . Depression   . GERD (gastroesophageal reflux disease)   . History of hiatal hernia   . History of migraine headaches   . Seasonal allergic rhinitis   . SUI (stress urinary incontinence, female)   . Ureteral obstruction, right   . Wears contact lenses     Patient Active Problem List   Diagnosis Date Noted  . Chronic insomnia 03/19/2015  . Episodic mood disorder (HCC) 03/19/2015  . Fatigue 09/02/2011  . Shakiness 09/02/2011  . Hx: recurrent pneumonia 09/02/2011  . Shortness of breath 09/02/2011  . Former smoker 09/02/2011  . Cough 09/01/2011  . COMMON MIGRAINE 01/19/2009  . ANXIETY DEPRESSION 11/06/2007  . EXTERNAL HEMORRHOIDS WITHOUT MENTION COMP 11/06/2007  . GERD 11/06/2007  . IRRITABLE BOWEL SYNDROME 11/06/2007  . OSTEOPENIA 11/06/2007    Past Surgical History:  Procedure Laterality Date  . ANTERIOR CERVICAL DECOMP/DISCECTOMY FUSION  03-10-2004   C4 -- C6  . BREAST BIOPSY Right 1980  . BREAST EXCISIONAL BIOPSY Right 1980  . COLONOSCOPY WITH PROPOFOL   03-08-2009  . CYSTOSCOPY W/ URETERAL STENT PLACEMENT Right 10/02/2014   Procedure: CYSTOSCOPY WITH RIGHT RETROGRADE PYELOGRAM , URETERAL STENT PLACEMENT;  Surgeon: Garnett Farm, MD;  Location: [facility];  Service: Urology;  Laterality: Right;  . ESOPHAGOGASTRODUODENOSCOPY  04-02-2002  . TOTAL HIP ARTHROPLASTY  03/03/2012   Procedure: TOTAL HIP ARTHROPLASTY;  Surgeon: [name], MD;  Location: [facility];  Service: Orthopedics;  Laterality: Left;  . TUBAL LIGATION  1971  . VAGINAL HYSTERECTOMY  1976     OB History   None      Home Medications    Prior to Admission medications   Medication Sig Start Date End Date Taking? Authorizing Provider  Calcium Carbonate-Vitamin D (CALTRATE 600+D PO) Take 1 tablet by mouth 3 (three) times a week.    [provider]  citalopram (CELEXA) 40 MG tablet TAKE 1 TABLET BY MOUTH ONCE A DAY 01/24/18   Burchette, Elberta Fortis, MD  clotrimazole-betamethasone (LOTRISONE) cream Apply one application to skin twice per day as needed for skin irritation. 01/13/16   Burchette, Elberta Fortis, MD  fluticasone (FLONASE) 50 MCG/ACT nasal spray Place 2 sprays into both nostrils daily. 06/08/17   Burchette, Elberta Fortis, MD  LINZESS 290 MCG CAPS capsule Take 290 mcg by mouth every morning.  06/19/14   [provider]  pantoprazole (PROTONIX) 40 MG tablet  Take 1 tablet (40 mg total) by mouth daily. 06/08/17   Burchette, Elberta Fortis, MD  phenazopyridine (PYRIDIUM) 200 MG tablet Take 1 tablet (200 mg total) by mouth 3 (three) times daily as needed for pain. 10/02/14   Ihor Gully, MD  SUMAtriptan (IMITREX) 100 MG tablet TAKE 1 TABLET BY MOUTH AT ONSET OF MIGRAINE. MAY REPEAT DOSE IN 1 HOUR IF HEADACHE NOT RESOLVED 05/08/17   Burchette, Elberta Fortis, MD  traMADol (ULTRAM) 50 MG tablet Take 1 tablet (50 mg total) by mouth every 6 (six) hours as needed. 05/26/18   Rolan Bucco, MD  zolpidem (AMBIEN) 5 MG tablet Take 1 tablet (5 mg total) by mouth at bedtime as needed. for  sleep 05/01/18   Kristian Covey, MD    Family History Family History  Problem Relation Age of Onset  . Cirrhosis Mother        non alcohol cirrhosis  . Cancer Father 41       lung cancer  . Breast cancer Maternal Uncle     Social History Social History   Tobacco Use  . Smoking status: Former Smoker    Packs/day: 0.50    Years: 20.00    Pack years: 10.00    Types: Cigarettes    Last attempt to quit: 10/02/1999    Years since quitting: 18.6  . Smokeless tobacco: Never Used  Substance Use Topics  . Alcohol use: Yes    Alcohol/week: 0.6 oz    Types: 1 Glasses of wine per week    Comment: occaional  . Drug use: No     Allergies   Hydrocodone   Review of Systems Review of Systems  Constitutional: Negative for chills, diaphoresis, fatigue and fever.  HENT: Negative for congestion, rhinorrhea and sneezing.   Eyes: Negative.   Respiratory: Negative for cough, chest tightness and shortness of breath.   Cardiovascular: Negative for chest pain and leg swelling.  Gastrointestinal: Positive for abdominal pain. Negative for blood in stool, diarrhea, nausea and vomiting.  Genitourinary: Positive for flank pain. Negative for difficulty urinating, frequency and hematuria.  Musculoskeletal: Positive for back pain. Negative for arthralgias.  Skin: Negative for rash.  Neurological: Negative for dizziness, speech difficulty, weakness, numbness and headaches.     Physical Exam Updated Vital Signs BP 124/84 (BP Location: Left Arm)   Pulse 60   Temp 98.3 F (36.8 C) (Oral)   Resp 18   Ht 5\' 5"  (1.651 m)   Wt 62.1 kg (137 lb)   SpO2 100%   BMI 22.80 kg/m   Physical Exam  Constitutional: She is oriented to person, place, and time. She appears well-developed and well-nourished.  HENT:  Head: Normocephalic and atraumatic.  Eyes: Pupils are equal, round, and reactive to light.  Neck: Normal range of motion. Neck supple.  Cardiovascular: Normal rate, regular rhythm and  normal heart sounds.  Pulmonary/Chest: Effort normal and breath sounds normal. No respiratory distress. She has no wheezes. She has no rales. She exhibits no tenderness.  Abdominal: Soft. Bowel sounds are normal. There is tenderness in the right lower quadrant. There is no rebound and no guarding.  Musculoskeletal: Normal range of motion. She exhibits no edema.  Lymphadenopathy:    She has no cervical adenopathy.  Neurological: She is alert and oriented to person, place, and time.  Skin: Skin is warm and dry. No rash noted.  Psychiatric: She has a normal mood and affect.     ED Treatments / Results  Labs (all labs  ordered are listed, but only abnormal results are displayed) Labs Reviewed  URINALYSIS, ROUTINE W REFLEX MICROSCOPIC - Abnormal; Notable for the following components:      Result Value   Specific Gravity, Urine <1.005 (*)    All other components within normal limits  LIPASE, BLOOD  COMPREHENSIVE METABOLIC PANEL  CBC    EKG None  Radiology Ct Renal Stone Study  Result Date: 05/26/2018 CLINICAL DATA:  Right-sided abdominal pain for 1 day, initial encounter EXAM: CT ABDOMEN AND PELVIS WITHOUT CONTRAST TECHNIQUE: Multidetector CT imaging of the abdomen and pelvis was performed following the standard protocol without IV contrast. COMPARISON:  08/07/2014 FINDINGS: Lower chest: Mild scarring is noted in the lingula and right middle lobe. Hepatobiliary: Liver again demonstrates some small hypodensities consistent with cysts. These are stable from the prior exam. The gallbladder is within normal limits. Pancreas: Unremarkable. No pancreatic ductal dilatation or surrounding inflammatory changes. Spleen: Normal in size without focal abnormality. Adrenals/Urinary Tract: Adrenal glands are within normal limits. Kidneys are well visualized bilaterally. Chronic right UPJ obstruction with dilatation is again identified similar to that seen on the prior exam. No definitive renal calculi are  seen. The bladder is well distended. Stomach/Bowel: The appendix is not well visualized. No inflammatory changes to suggest appendicitis are seen. Vascular/Lymphatic: No significant vascular findings are present. No enlarged abdominal or pelvic lymph nodes. Reproductive: Status post hysterectomy. No adnexal masses. Other: No abdominal wall hernia or abnormality. No abdominopelvic ascites. Musculoskeletal: Left hip replacement is noted. No acute bony abnormality is seen. IMPRESSION: Chronic right UPJ obstructive change. No acute abnormality to correspond with the patient's clinical symptomatology is noted. Electronically Signed   By: Alcide CleverMark  Lukens M.D.   On: 05/26/2018 11:46    Procedures Procedures (including critical care time)  Medications Ordered in ED Medications  fentaNYL (SUBLIMAZE) injection 50 mcg (50 mcg Intravenous Given 05/26/18 1118)  ondansetron (ZOFRAN) injection 4 mg (4 mg Intravenous Given 05/26/18 1116)     Initial Impression / Assessment and Plan / ED Course  I have reviewed the triage vital signs and the nursing notes.  Pertinent labs & imaging results that were available during my care of the patient were reviewed by me and considered in my medical decision making (see chart for details).     Patient is a 72 year old female who presents with right lower abdominal/flank pain.  She has no associated vomiting or fevers.  Her labs are non-concerning.  Her urinalysis is clear without evidence of infection or hematuria.  She has had a prior UPJ obstruction that required stent placement in 2015.  Her CT scan does not show any change or acute abnormalities.  Her pain is well controlled in the ED after 1 dose of fentanyl.  She was discharged home in good condition.  She was encouraged to have close follow-up with her PCP.  Return precautions were given.  She was given a prescription for tramadol for pain.  Final Clinical Impressions(s) / ED Diagnoses   Final diagnoses:  Right lower  quadrant abdominal pain    ED Discharge Orders        Ordered    traMADol (ULTRAM) 50 MG tablet  Every 6 hours PRN     05/26/18 1240       Rolan BuccoBelfi, Layli Capshaw, MD 05/26/18 1242

## 2018-05-26 NOTE — ED Triage Notes (Signed)
RLQ abd pain radiating into her back since yesterday. Denies N/V, urinary symptoms.

## 2018-05-26 NOTE — ED Notes (Signed)
Patient transported to CT 

## 2018-05-27 ENCOUNTER — Encounter (HOSPITAL_COMMUNITY): Payer: Self-pay

## 2018-05-27 ENCOUNTER — Telehealth: Payer: Self-pay | Admitting: Family Medicine

## 2018-05-27 ENCOUNTER — Other Ambulatory Visit: Payer: Self-pay

## 2018-05-27 ENCOUNTER — Emergency Department (HOSPITAL_COMMUNITY)
Admission: EM | Admit: 2018-05-27 | Discharge: 2018-05-27 | Disposition: A | Discharge status: Home / Self Care | Payer: Managed Care, Other (non HMO) | Attending: Emergency Medicine | Admitting: Emergency Medicine

## 2018-05-27 DIAGNOSIS — R1031 Right lower quadrant pain: Secondary | ICD-10-CM | POA: Diagnosis not present

## 2018-05-27 DIAGNOSIS — M545 Low back pain: Secondary | ICD-10-CM | POA: Diagnosis not present

## 2018-05-27 DIAGNOSIS — R300 Dysuria: Secondary | ICD-10-CM

## 2018-05-27 DIAGNOSIS — Z5321 Procedure and treatment not carried out due to patient leaving prior to being seen by health care provider: Secondary | ICD-10-CM | POA: Insufficient documentation

## 2018-05-27 DIAGNOSIS — R1030 Lower abdominal pain, unspecified: Secondary | ICD-10-CM

## 2018-05-27 NOTE — Telephone Encounter (Signed)
Copied from CRM [id number]. Topic: Referral - Request >> May 27, 2018  9:48 AM Lorrine KinMcGee, Demi B, NT wrote: Reason for CRM: Patient requesting an appointment with a urologist after visiting the ED on Saturday 05/25/18. ED requested she follow up with a urologist. States that she was seen about 3 years ago at [facility] CB#: [phone]

## 2018-05-27 NOTE — Telephone Encounter (Signed)
Okay to refer? 

## 2018-05-27 NOTE — ED Triage Notes (Addendum)
Pt c/o RLQ pain radiating into R lower back x 2 days.  Pain score 8/10.  Denies n/v/d.  Pt was seen at [facility] yesterday for same.  CT and labwork were negative.  Pt has taken prescribed tramadol w/o relief.  Pt attempted to follow-up w/ [facility], but has not received a callback.  Sts she had a stent placed in R kidney d/t a "blockage" x 3 years ago.

## 2018-05-27 NOTE — Telephone Encounter (Signed)
OK 

## 2018-05-28 NOTE — Telephone Encounter (Signed)
Referral placed.

## 2018-05-30 ENCOUNTER — Other Ambulatory Visit: Payer: Self-pay | Admitting: Urology

## 2018-05-30 ENCOUNTER — Telehealth: Payer: Self-pay | Admitting: Family Medicine

## 2018-05-30 DIAGNOSIS — N130 Hydronephrosis with ureteropelvic junction obstruction: Secondary | ICD-10-CM

## 2018-05-30 NOTE — Telephone Encounter (Signed)
Copied from CRM [id number]. Topic: Referral - Request >> May 27, 2018  9:48 AM Lorrine KinMcGee, Demi B, NT wrote: Reason for CRM: Patient requesting an appointment with a urologist after visiting the ED on Saturday 05/25/18. ED requested she follow up with a urologist. States that she was seen about 3 years ago at [facility] CB#: [phone]  >> May 30, 2018  8:38 AM Jolayne Hainesaylor, Brittany L wrote: Patient would like to know will the renal exam be able to show the pain that is in her side? She has an appointment with them on 8/21. Please contact patient [phone]

## 2018-05-31 NOTE — Telephone Encounter (Signed)
There is no way to say for sure whether Urologist will be able to determine cause of her pain- but I feel follow up with them is reasonable.

## 2018-05-31 NOTE — Telephone Encounter (Signed)
I called the pt and informed her of the message below.  She stated she has an appt with [facility] on 8/21.

## 2018-06-12 ENCOUNTER — Ambulatory Visit (HOSPITAL_COMMUNITY)
Admission: RE | Admit: 2018-06-12 | Discharge: 2018-06-12 | Disposition: A | Discharge status: Home / Self Care | Payer: Managed Care, Other (non HMO) | Source: Ambulatory Visit | Attending: Urology | Admitting: Urology

## 2018-06-12 ENCOUNTER — Other Ambulatory Visit: Payer: Self-pay | Admitting: Family Medicine

## 2018-06-12 DIAGNOSIS — N130 Hydronephrosis with ureteropelvic junction obstruction: Secondary | ICD-10-CM | POA: Insufficient documentation

## 2018-06-12 MED ORDER — FUROSEMIDE 10 MG/ML IJ SOLN
31.0000 mg | Freq: Once | INTRAMUSCULAR | Status: AC
  Administered 2018-06-12: 31 mg via INTRAVENOUS

## 2018-06-12 MED ORDER — FUROSEMIDE 10 MG/ML IJ SOLN
INTRAMUSCULAR | Status: AC
  Filled 2018-06-12: qty 4

## 2018-06-12 MED ORDER — TECHNETIUM TC 99M MERTIATIDE
5.1000 | Freq: Once | INTRAVENOUS | Status: AC | PRN
  Administered 2018-06-12: 5.1 via INTRAVENOUS

## 2018-07-08 ENCOUNTER — Ambulatory Visit: Payer: Managed Care, Other (non HMO) | Admitting: Family Medicine

## 2018-07-08 ENCOUNTER — Other Ambulatory Visit: Payer: Self-pay

## 2018-07-08 ENCOUNTER — Encounter: Payer: Self-pay | Admitting: Family Medicine

## 2018-07-08 VITALS — BP 122/84 | HR 83 | Temp 98.0°F | Resp 17 | Ht 63.75 in | Wt 135.8 lb

## 2018-07-08 DIAGNOSIS — M546 Pain in thoracic spine: Secondary | ICD-10-CM | POA: Diagnosis not present

## 2018-07-08 MED ORDER — CYCLOBENZAPRINE HCL 5 MG PO TABS: 5.0000 mg | ORAL_TABLET | Freq: Three times a day (TID) | ORAL | 1 refills | Status: DC | PRN

## 2018-07-08 NOTE — Patient Instructions (Signed)
Let me know in a couple of weeks if pain no better  Continue with topical heat if this helps.

## 2018-07-08 NOTE — Progress Notes (Signed)
Subjective:     Patient ID: Kim ChuteBarbara F Daniel, female   DOB: [birth year], 72 y.o.   MRN: [id number]  HPI Patient seen with bilateral thoracic back pain. About 10 days ago she was moving some furniture stepping backwards and caught her foot on something and fell backwards. She states she landed on her thoracic area into the grass. She "lost her breath "but otherwise did not note any acute injury. No extremity injury. She noticed some soreness and pain right thoracic area greater than left since then. She's taken some tramadol and ibuprofen with mild relief. Has tried heat with minimal relief.  She feels this is more muscular.  Minimal pleuritic pain. No cough. No head injury.  Past Medical History:  Diagnosis Date  . Anxiety   . Anxiety and depression   . Chronic constipation   . Depression   . GERD (gastroesophageal reflux disease)   . History of hiatal hernia   . History of migraine headaches   . Seasonal allergic rhinitis   . SUI (stress urinary incontinence, female)   . Ureteral obstruction, right   . Wears contact lenses    Past Surgical History:  Procedure Laterality Date  . ANTERIOR CERVICAL DECOMP/DISCECTOMY FUSION  03-10-2004   C4 -- C6  . BREAST BIOPSY Right 1980  . BREAST EXCISIONAL BIOPSY Right 1980  . COLONOSCOPY WITH PROPOFOL  03-08-2009  . CYSTOSCOPY W/ URETERAL STENT PLACEMENT Right 10/02/2014   Procedure: CYSTOSCOPY WITH RIGHT RETROGRADE PYELOGRAM , URETERAL STENT PLACEMENT;  Surgeon: Garnett FarmMark C Ottelin, MD;  [facility];  Service: Urology;  Laterality: Right;  . ESOPHAGOGASTRODUODENOSCOPY  04-02-2002  . TOTAL HIP ARTHROPLASTY  03/03/2012   Procedure: TOTAL HIP ARTHROPLASTY;  Surgeon: Shelda PalMatthew D Olin, MD;  Location: [facility];  Service: Orthopedics;  Laterality: Left;  . TUBAL LIGATION  1971  . VAGINAL HYSTERECTOMY  1976    reports that she quit smoking about 18 years ago. Her smoking use included cigarettes. She has a 10.00 pack-year smoking  history. She has never used smokeless tobacco. She reports that she drinks about 1.0 standard drinks of alcohol per week. She reports that she does not use drugs. family history includes Breast cancer in her maternal uncle; Cancer (age of onset: 6758) in her father; Cirrhosis in her mother. Allergies  Allergen Reactions  . Hydrocodone Nausea And Vomiting and Other (See Comments)    "get dizzy and spaced out" Other reaction(s): Other (See Comments) "get dizzy and spaced out"  '   Review of Systems  Respiratory: Negative for shortness of breath.   Cardiovascular: Negative for chest pain.  Neurological: Negative for dizziness, syncope and headaches.       Objective:   Physical Exam  Constitutional: She appears well-developed and well-nourished.  Neck: Neck supple.  Cardiovascular: Normal rate and regular rhythm.  Pulmonary/Chest: Effort normal and breath sounds normal. She has no wheezes. She has no rales.  Musculoskeletal:  No spinal tenderness. She has some muscular tenderness mid thoracic area right greater than left. No localizing rib tenderness.       Assessment:     Mid thoracic back pain right greater than left. Suspect muscular.    Plan:     -continue heat for symptomatic relief -We discussed pros and cons of x-rays and at this point she would like to observe and try symptomatic treatment first. We agreed to low-dose Flexeril 5 mg daily at bedtime. Touch base if pain not improving over the next couple weeks  Jermall Isaacson  Dan Europe MD [facility] at [facility]

## 2018-07-19 ENCOUNTER — Encounter: Payer: Self-pay | Admitting: Family Medicine

## 2018-07-19 ENCOUNTER — Ambulatory Visit: Payer: Managed Care, Other (non HMO) | Admitting: Family Medicine

## 2018-07-19 ENCOUNTER — Other Ambulatory Visit: Payer: Self-pay

## 2018-07-19 ENCOUNTER — Ambulatory Visit (INDEPENDENT_AMBULATORY_CARE_PROVIDER_SITE_OTHER): Payer: Managed Care, Other (non HMO)

## 2018-07-19 ENCOUNTER — Other Ambulatory Visit: Payer: Self-pay | Admitting: Family Medicine

## 2018-07-19 VITALS — BP 106/62 | HR 81 | Temp 98.4°F | Ht 63.75 in | Wt 134.9 lb

## 2018-07-19 DIAGNOSIS — M546 Pain in thoracic spine: Secondary | ICD-10-CM

## 2018-07-19 NOTE — Patient Instructions (Signed)
We will call you after X-ray over read back.

## 2018-07-19 NOTE — Progress Notes (Signed)
  Subjective:     Patient ID: Kim Daniel, female   DOB: 11-May-1946, 72 y.o.   MRN: [id number]  HPI  Is seen for follow-up regarding recent fall which occurred on 06/28/2018.  She fell backwards while moving some furniture.  She states she had the breath knocked out of her.  She has had some pains around the mid thoracic area since then radiating bilateral up under her scapular region.  Radiates all the way to the front bilaterally.  No dyspnea.  No pleuritic pain.  She has been taking some anti-inflammatory and Tylenol with minimal relief.  Patient is worse at night when she rolls over.  Past Medical History:  Diagnosis Date  . Anxiety   . Anxiety and depression   . Chronic constipation   . Depression   . GERD (gastroesophageal reflux disease)   . History of hiatal hernia   . History of migraine headaches   . Seasonal allergic rhinitis   . SUI (stress urinary incontinence, female)   . Ureteral obstruction, right   . Wears contact lenses    Past Surgical History:  Procedure Laterality Date  . ANTERIOR CERVICAL DECOMP/DISCECTOMY FUSION  03-10-2004   C4 -- C6  . BREAST BIOPSY Right 1980  . BREAST EXCISIONAL BIOPSY Right 1980  . COLONOSCOPY WITH PROPOFOL  03-08-2009  . CYSTOSCOPY W/ URETERAL STENT PLACEMENT Right 10/02/2014   Procedure: CYSTOSCOPY WITH RIGHT RETROGRADE PYELOGRAM , URETERAL STENT PLACEMENT;  Surgeon: Garnett Farm, MD;  Location: [facility];  Service: Urology;  Laterality: Right;  . ESOPHAGOGASTRODUODENOSCOPY  04-02-2002  . TOTAL HIP ARTHROPLASTY  03/03/2012   Procedure: TOTAL HIP ARTHROPLASTY;  Surgeon: [name], MD;  Location: [facility];  Service: Orthopedics;  Laterality: Left;  . TUBAL LIGATION  1971  . VAGINAL HYSTERECTOMY  1976    reports that she quit smoking about 18 years ago. Her smoking use included cigarettes. She has a 10.00 pack-year smoking history. She has never used smokeless tobacco. She reports that she drinks about 1.0  standard drinks of alcohol per week. She reports that she does not use drugs. family history includes Breast cancer in her maternal uncle; Cancer (age of onset: 75) in her father; Cirrhosis in her mother. Allergies  Allergen Reactions  . Hydrocodone Nausea And Vomiting and Other (See Comments)    "get dizzy and spaced out" Other reaction(s): Other (See Comments) "get dizzy and spaced out"    Review of Systems  Constitutional: Negative for chills and fever.  Respiratory: Negative for cough and shortness of breath.   Cardiovascular: Negative for chest pain.       Objective:   Physical Exam  Constitutional: She appears well-developed and well-nourished.  Cardiovascular: Normal rate and regular rhythm.  Pulmonary/Chest: Effort normal and breath sounds normal. No stridor. She has no wheezes. She has no rales.  Musculoskeletal:  No spinal tenderness to palpation of the thoracic spine       Assessment:     Persistent acute bilateral thoracic pain following fall    Plan:     -Obtain plain films thoracic spine to further assess-rule out compression fracture  Kristian Covey MD  Primary [facility] at [facility]

## 2018-07-26 ENCOUNTER — Other Ambulatory Visit: Payer: Self-pay | Admitting: Family Medicine

## 2018-07-30 ENCOUNTER — Other Ambulatory Visit: Payer: Self-pay

## 2018-07-30 ENCOUNTER — Telehealth: Payer: Self-pay

## 2018-07-30 DIAGNOSIS — M546 Pain in thoracic spine: Secondary | ICD-10-CM

## 2018-07-30 NOTE — Telephone Encounter (Signed)
Dr Venita Lick

## 2018-07-30 NOTE — Telephone Encounter (Signed)
Let's set up with Dr Estill Bamberg- unless she has another preference.

## 2018-07-30 NOTE — Telephone Encounter (Signed)
Referral has been made to [facility] for Dr. Venita Lick.

## 2018-07-30 NOTE — Telephone Encounter (Signed)
Copied from CRM [id number]. Topic: Referral - Request >> Jul 30, 2018 11:13 AM Gean Birchwood R wrote: Patient is calling to see if she can get a referral to a back specialist form her fall that happened on 06/28/2018. She seen Dr Caryl Never on 07/19/2018 and was told if not better she may get referral  Cb# [phone]

## 2018-07-30 NOTE — Telephone Encounter (Signed)
Please advise 

## 2018-07-30 NOTE — Telephone Encounter (Signed)
Called patient and she stated that she would like to see someone in [facility] and asked who you would recommend over there for her back?

## 2018-07-31 ENCOUNTER — Telehealth: Payer: Self-pay | Admitting: Family Medicine

## 2018-07-31 NOTE — Telephone Encounter (Signed)
Copied from CRM [id number]. Topic: General - Other >> Jul 31, 2018  3:50 PM Tamela Oddi wrote: Reason for CRM: Patient called to speak with [facility] regarding her referral.  Patient stated that she prefers to have the referral with [facility], but if it is going to be delayed and [facility] has an earlier appointment, then she would like to have the referral with [facility].  Please advise.  CB# [phone].

## 2018-08-05 DIAGNOSIS — S29012A Strain of muscle and tendon of back wall of thorax, initial encounter: Secondary | ICD-10-CM | POA: Insufficient documentation

## 2018-08-19 ENCOUNTER — Telehealth: Payer: Self-pay | Admitting: Family Medicine

## 2018-08-19 NOTE — Telephone Encounter (Signed)
Copied from CRM [id number]. Topic: General - Other >> Aug 19, 2018  4:10 PM Stephannie Li, NT wrote: Reason for CRM: Patient went to see Dr Shon Baton today and would like to discuss the visit with the Dr Burchette's nurse today in detail and would like a return call at [phone] as soon as possible .

## 2018-08-20 ENCOUNTER — Telehealth: Payer: Self-pay | Admitting: Family Medicine

## 2018-08-20 NOTE — Telephone Encounter (Signed)
Called patient and not able to leave a voice message on voice mail due to a full voice mail box.   OK for PEC to discuss with patient if she wants.  CRM Created.

## 2018-08-20 NOTE — Telephone Encounter (Signed)
Would you like an office visit? 

## 2018-08-20 NOTE — Telephone Encounter (Signed)
Yes. Thanks 

## 2018-08-20 NOTE — Telephone Encounter (Signed)
Pt returning call to discuss appt with Dr. Shon Baton.  States Dr. Shon Baton is hesitant to schedule back surgery until another bone density test is done. Last scan done 2016, "Osteoporosis."  Also states informed by Dr. Shon Baton she should have an appt with Dr. Caryl Never.Marland Kitchen "A complete work up to check labs including thyroid. Not sure what all he wants done."  States she will schedule density test with her GYN who has done previous ones.  Please advise: [phone]  NT unable to find any note from Dr. Shon Baton.

## 2018-08-21 ENCOUNTER — Other Ambulatory Visit: Payer: Self-pay | Admitting: *Deleted

## 2018-08-21 NOTE — Telephone Encounter (Signed)
Patient is aware and an appointment scheduled 

## 2018-08-30 ENCOUNTER — Other Ambulatory Visit: Payer: Self-pay

## 2018-08-30 ENCOUNTER — Ambulatory Visit: Payer: Managed Care, Other (non HMO) | Admitting: Family Medicine

## 2018-08-30 ENCOUNTER — Encounter: Payer: Self-pay | Admitting: Family Medicine

## 2018-08-30 VITALS — BP 102/76 | HR 75 | Temp 97.9°F | Ht 63.75 in | Wt 137.2 lb

## 2018-08-30 DIAGNOSIS — M810 Age-related osteoporosis without current pathological fracture: Secondary | ICD-10-CM | POA: Diagnosis not present

## 2018-08-30 DIAGNOSIS — S22070A Wedge compression fracture of T9-T10 vertebra, initial encounter for closed fracture: Secondary | ICD-10-CM | POA: Insufficient documentation

## 2018-08-30 DIAGNOSIS — S22070D Wedge compression fracture of T9-T10 vertebra, subsequent encounter for fracture with routine healing: Secondary | ICD-10-CM

## 2018-08-30 LAB — VITAMIN D 25 HYDROXY (VIT D DEFICIENCY, FRACTURES): VITD: 17.03 ng/mL — ABNORMAL LOW (ref 30.00–100.00)

## 2018-08-30 MED ORDER — ZOLPIDEM TARTRATE 5 MG PO TABS: 5.0000 mg | ORAL_TABLET | Freq: Every evening | ORAL | 2 refills | Status: DC | PRN

## 2018-08-30 NOTE — Patient Instructions (Signed)
Consider tapering off the Protonix and going to Pepcid 20 mg once or twice daily.

## 2018-08-30 NOTE — Progress Notes (Signed)
Subjective:     Patient ID: Kim Daniel, female   DOB: 1946-04-07, 72 y.o.   MRN: [id number]  HPI  Patient here to discuss osteoporosis issues.  She had recent visit with orthopedic surgeon regarding vertebral compression fracture.  This was following an injury.  Patient sees gynecologist regularly and states her last DEXA scan was 2016.  This apparently showed T score which was well below threshold for osteoporosis.  Patient states back in 2016 she was prescribed Boniva by GYN but only took this a few months and took herself off.  She denied any side effects.  She is not aware of any other osteoporosis interventions.  She denies any other history of fracture other than the recent compression fracture T9.  Currently on Protonix 40 mg and now taking every other day with no breakthrough symptoms.  She had multiple labs done back in August including TSH and chemistries.  Her calcium levels were normal.  She has not had vitamin D level.  She does not take regular calcium or vitamin D.  Does some walking for exercise.  Past Medical History:  Diagnosis Date  . Anxiety   . Anxiety and depression   . Chronic constipation   . Depression   . GERD (gastroesophageal reflux disease)   . History of hiatal hernia   . History of migraine headaches   . Seasonal allergic rhinitis   . SUI (stress urinary incontinence, female)   . Ureteral obstruction, right   . Wears contact lenses    Past Surgical History:  Procedure Laterality Date  . ANTERIOR CERVICAL DECOMP/DISCECTOMY FUSION  03-10-2004   C4 -- C6  . BREAST BIOPSY Right 1980  . BREAST EXCISIONAL BIOPSY Right 1980  . COLONOSCOPY WITH PROPOFOL  03-08-2009  . CYSTOSCOPY W/ URETERAL STENT PLACEMENT Right 10/02/2014   Procedure: CYSTOSCOPY WITH RIGHT RETROGRADE PYELOGRAM , URETERAL STENT PLACEMENT;  Surgeon: Garnett Farm, MD;  Location: [facility];  Service: Urology;  Laterality: Right;  . ESOPHAGOGASTRODUODENOSCOPY  04-02-2002   . TOTAL HIP ARTHROPLASTY  03/03/2012   Procedure: TOTAL HIP ARTHROPLASTY;  Surgeon: [name], MD;  Location: [facility];  Service: Orthopedics;  Laterality: Left;  . TUBAL LIGATION  1971  . VAGINAL HYSTERECTOMY  1976    reports that she quit smoking about 18 years ago. Her smoking use included cigarettes. She has a 10.00 pack-year smoking history. She has never used smokeless tobacco. She reports that she drinks about 1.0 standard drinks of alcohol per week. She reports that she does not use drugs. family history includes Breast cancer in her maternal uncle; Cancer (age of onset: 41) in her father; Cirrhosis in her mother. Allergies  Allergen Reactions  . Hydrocodone Nausea And Vomiting and Other (See Comments)    "get dizzy and spaced out" Other reaction(s): Other (See Comments) "get dizzy and spaced out"    Review of Systems  Constitutional: Negative for appetite change and unexpected weight change.  Respiratory: Negative for shortness of breath.   Cardiovascular: Negative for chest pain.  Musculoskeletal: Positive for back pain. Negative for gait problem.       Objective:   Physical Exam  Constitutional: She is oriented to person, place, and time. She appears well-developed and well-nourished.  Cardiovascular: Normal rate and regular rhythm.  Pulmonary/Chest: Effort normal and breath sounds normal. She has no wheezes. She has no rales.  Musculoskeletal: She exhibits no edema.  Neurological: She is alert and oriented to person, place, and time.  Psychiatric: She has a normal mood and affect. Her behavior is normal.       Assessment:     #1 recent compression fracture T9  #2 osteoporosis currently not on therapy    Plan:     -Patient has follow-up DEXA scan scheduled with her GYN December 4 and she plans to see him that same day.  She plans to discuss possible medical therapies at that time  -Check 25 hydroxy vitamin D level  -Patient already had recent chemistries  including thyroid function and calcium as well as CBC back in August so these were not repeated today.  -We recommend try to taper off Protonix and switch to Pepcid 20 mg once or twice daily as needed  -Recommend daily calcium 1200 mg and vitamin D 800 to 1000 international units and continue regular weightbearing exercise  Kristian Covey MD [facility]

## 2018-09-02 ENCOUNTER — Other Ambulatory Visit: Payer: Self-pay

## 2018-09-02 ENCOUNTER — Telehealth: Payer: Self-pay | Admitting: Family Medicine

## 2018-09-02 DIAGNOSIS — M810 Age-related osteoporosis without current pathological fracture: Secondary | ICD-10-CM

## 2018-09-02 DIAGNOSIS — R7989 Other specified abnormal findings of blood chemistry: Secondary | ICD-10-CM

## 2018-09-02 MED ORDER — VITAMIN D (ERGOCALCIFEROL) 1.25 MG (50000 UNIT) PO CAPS: 50000.0000 [IU] | ORAL_CAPSULE | ORAL | 0 refills | Status: DC

## 2018-09-02 NOTE — Telephone Encounter (Signed)
Pt states she reviewed results in MYChart but unable to see recommendations. Message from Dr. Rosezetta Schlatter read to patient; verbalizes understanding. Is agreeable to Vit D2  50,000 q week.  Will CB to schedule repeat labs.   [employer] [employer].

## 2018-09-02 NOTE — Telephone Encounter (Signed)
Vitamin D prescription has been sent to [facility].

## 2018-12-16 ENCOUNTER — Other Ambulatory Visit: Payer: Self-pay | Admitting: Family Medicine

## 2018-12-24 ENCOUNTER — Other Ambulatory Visit (HOSPITAL_COMMUNITY): Payer: Self-pay | Admitting: *Deleted

## 2018-12-25 ENCOUNTER — Encounter (HOSPITAL_COMMUNITY)
Admission: RE | Admit: 2018-12-25 | Discharge: 2018-12-25 | Disposition: A | Discharge status: Home / Self Care | Payer: Managed Care, Other (non HMO) | Source: Ambulatory Visit | Attending: Sports Medicine | Admitting: Sports Medicine

## 2018-12-25 ENCOUNTER — Other Ambulatory Visit: Payer: Self-pay

## 2018-12-25 DIAGNOSIS — M810 Age-related osteoporosis without current pathological fracture: Secondary | ICD-10-CM | POA: Diagnosis not present

## 2018-12-25 MED ORDER — ROMOSOZUMAB-AQQG 105 MG/1.17ML ~~LOC~~ SOSY
210.0000 mg | PREFILLED_SYRINGE | Freq: Once | SUBCUTANEOUS | Status: AC
  Administered 2018-12-25: 210 mg via SUBCUTANEOUS
  Filled 2018-12-25: qty 2.4

## 2019-01-22 ENCOUNTER — Inpatient Hospital Stay (HOSPITAL_COMMUNITY): Admission: RE | Admit: 2019-01-22 | Payer: Managed Care, Other (non HMO) | Source: Ambulatory Visit

## 2019-02-10 ENCOUNTER — Other Ambulatory Visit: Payer: Self-pay | Admitting: Family Medicine

## 2019-02-11 NOTE — Telephone Encounter (Signed)
Last OV 08/30/18, No future OV  Last filled 08/30/18, # 30 with 2 refills

## 2019-02-17 ENCOUNTER — Ambulatory Visit (INDEPENDENT_AMBULATORY_CARE_PROVIDER_SITE_OTHER): Payer: Managed Care, Other (non HMO) | Admitting: Family Medicine

## 2019-02-17 ENCOUNTER — Other Ambulatory Visit: Payer: Self-pay

## 2019-02-17 DIAGNOSIS — F339 Major depressive disorder, recurrent, unspecified: Secondary | ICD-10-CM | POA: Diagnosis not present

## 2019-02-17 MED ORDER — BUPROPION HCL ER (XL) 150 MG PO TB24: 150.0000 mg | ORAL_TABLET | Freq: Every day | ORAL | 1 refills | Status: DC

## 2019-02-17 NOTE — Progress Notes (Signed)
Patient ID: Kim Daniel, female   DOB: [birth year], 73 y.o.   MRN: [id number]  This visit type was conducted due to national recommendations for restrictions regarding the COVID-19 pandemic in an effort to limit this patient's exposure and mitigate transmission in our community.   Virtual Visit via Video Note  I connected with Kim Daniel on 02/17/19 at  3:00 PM EDT by a video enabled telemedicine application and verified that I am speaking with the correct person using two identifiers.  Location patient: home Location provider:work or home office Persons participating in the virtual visit: patient, provider  I discussed the limitations of evaluation and management by telemedicine and the availability of in person appointments. The patient expressed understanding and agreed to proceed.   HPI: Patient has long history of recurrent depression.  She has been on Celexa 40 mg daily for several years.  She has had several weeks now of some progressive depression symptoms.  She has increased sadness and tearfulness at times.  Decreased interest in hobbies and activities and low motivation.  She is still working full-time but is working from home.  She feels like the isolation has been very difficult for her.  She is married and has good interactions with her husband and also with some of her sisters and friends.  No suicidal ideation.  Has taken combination therapy apparently in the past with Wellbutrin.  She does not recall any adverse side effects.  No history of seizure   ROS: See pertinent positives and negatives per HPI.  Past Medical History:  Diagnosis Date  . Anxiety   . Anxiety and depression   . Chronic constipation   . Depression   . GERD (gastroesophageal reflux disease)   . History of hiatal hernia   . History of migraine headaches   . Seasonal allergic rhinitis   . SUI (stress urinary incontinence, female)   . Ureteral obstruction, right   . Wears contact lenses      Past Surgical History:  Procedure Laterality Date  . ANTERIOR CERVICAL DECOMP/DISCECTOMY FUSION  03-10-2004   C4 -- C6  . BREAST BIOPSY Right 1980  . BREAST EXCISIONAL BIOPSY Right 1980  . COLONOSCOPY WITH PROPOFOL  03-08-2009  . CYSTOSCOPY W/ URETERAL STENT PLACEMENT Right 10/02/2014   Procedure: CYSTOSCOPY WITH RIGHT RETROGRADE PYELOGRAM , URETERAL STENT PLACEMENT;  Surgeon: Garnett Farm, MD;  Location: [facility];  Service: Urology;  Laterality: Right;  . ESOPHAGOGASTRODUODENOSCOPY  04-02-2002  . TOTAL HIP ARTHROPLASTY  03/03/2012   Procedure: TOTAL HIP ARTHROPLASTY;  Surgeon: [name], MD;  Location: [facility];  Service: Orthopedics;  Laterality: Left;  . TUBAL LIGATION  1971  . VAGINAL HYSTERECTOMY  1976    Family History  Problem Relation Age of Onset  . Cirrhosis Mother        non alcohol cirrhosis  . Cancer Father 29       lung cancer  . Breast cancer Maternal Uncle     SOCIAL HX: Smoking around 2000   Current Outpatient Medications:  .  buPROPion (WELLBUTRIN XL) 150 MG 24 hr tablet, Take 1 tablet (150 mg total) by mouth daily., Disp: 90 tablet, Rfl: 1 .  Calcium Carbonate-Vitamin D (CALTRATE 600+D PO), Take 1 tablet by mouth 3 (three) times a week., Disp: , Rfl:  .  citalopram (CELEXA) 40 MG tablet, TAKE 1 TABLET BY MOUTH ONCE DAILY, Disp: 90 tablet, Rfl: 1 .  clonazePAM (KLONOPIN) 0.5 MG tablet, TAKE 1 TABLET BY  MOUTH EVERY DAY AS NEEDED, Disp: , Rfl:  .  clotrimazole-betamethasone (LOTRISONE) cream, Apply one application to skin twice per day as needed for skin irritation., Disp: 30 g, Rfl: 1 .  fluticasone (FLONASE) 50 MCG/ACT nasal spray, Place 2 sprays into both nostrils daily., Disp: 48 g, Rfl: 3 .  LINZESS 290 MCG CAPS capsule, Take 290 mcg by mouth every morning. , Disp: , Rfl:  .  pantoprazole (PROTONIX) 40 MG tablet, TAKE 1 TABLET BY MOUTH ONCE DAILY, Disp: 90 tablet, Rfl: 3 .  phenazopyridine (PYRIDIUM) 200 MG tablet, Take 1 tablet  (200 mg total) by mouth 3 (three) times daily as needed for pain., Disp: 30 tablet, Rfl: 0 .  SUMAtriptan (IMITREX) 100 MG tablet, TAKE 1 TABLET BY MOUTH AT ONSET OF MIGRAINE. MAY REPEAT DOSE IN 1 HOUR IF HEADACHE NOT RESOLVED, Disp: 9 tablet, Rfl: 0 .  Vitamin D, Ergocalciferol, (DRISDOL) 1.25 MG (50000 UT) CAPS capsule, Take 1 capsule (50,000 Units total) by mouth every 7 (seven) days., Disp: 12 capsule, Rfl: 0 .  zolpidem (AMBIEN) 5 MG tablet, TAKE 1 TABLET BY MOUTH AT BEDTIME AS NEEDED FOR SLEEP., Disp: 30 tablet, Rfl: 2  EXAM:  VITALS per patient if applicable:  GENERAL: alert, oriented, appears well and in no acute distress  HEENT: atraumatic, conjunttiva clear, no obvious abnormalities on inspection of external nose and ears  NECK: normal movements of the head and neck  LUNGS: on inspection no signs of respiratory distress, breathing rate appears normal, no obvious gross SOB, gasping or wheezing  CV: no obvious cyanosis  MS: moves all visible extremities without noticeable abnormality  PSYCH/NEURO: pleasant and cooperative, no obvious depression or anxiety, speech and thought processing grossly intact  ASSESSMENT AND PLAN:  Discussed the following assessment and plan:  Depression, recurrent (HCC)  Patient has had several weeks of progressive depression symptoms in spite of therapy with Celexa 40 mg daily  -Add Wellbutrin XL 150 mg once daily -We discussed possible counseling and she would like to look at medication response first -We discussed trying to stay engaged with hobbies and activities outside of work and to keep connections as much as possible.  We discussed exploring other options such as Zoom-to stay in touch with friends and family during current pandemic -We recommend giving some feedback in 2 to 3 weeks how she is responding     I discussed the assessment and treatment plan with the patient. The patient was provided an opportunity to ask questions and all  were answered. The patient agreed with the plan and demonstrated an understanding of the instructions.   The patient was advised to call back or seek an in-person evaluation if the symptoms worsen or if the condition fails to improve as anticipated.   Evelena PeatBruce Ethelean Colla, MD

## 2019-02-19 ENCOUNTER — Encounter (HOSPITAL_COMMUNITY): Payer: Managed Care, Other (non HMO)

## 2019-05-14 ENCOUNTER — Other Ambulatory Visit: Payer: Self-pay | Admitting: Family Medicine

## 2019-05-26 ENCOUNTER — Ambulatory Visit: Payer: Self-pay

## 2019-05-26 NOTE — Telephone Encounter (Signed)
Pt called after a fall that happened last evening.  She states that she slipped on the floor and hit her left side of her face.  She states that she can not touch her brow bone without severe pain Her cheek bone on the left is also swollen and her left eye is bruised and swollen almost shut. She states she can see from the eye. She has some scrapes to her left arm and left knee. Pt states that she did not lose consciousness. She is slightly nauseated and her headache is returning. Care advice read to patient.  She will go to urgent care for evaluation because of no availability at the office.  Reason for Disposition . [1] Age over 57 years AND [2] swelling or bruise  Answer Assessment - Initial Assessment Questions 1. MECHANISM: "How did the injury happen?" For falls, ask: "What height did you fall from?" and "What surface did you fall against?"      Slipped on floor and hit left side of face arm and knee 2. ONSET: "When did the injury happen?" (Minutes or hours ago)      Last evening 3. NEUROLOGIC SYMPTOMS: "Was there any loss of consciousness?" "Are there any other neurological symptoms?"      Extreme headache 4. MENTAL STATUS: "Does the person know who he is, who you are, and where he is?"     No problems 5. LOCATION: "What part of the head was hit?"     Left side brow bone and cheek bone eye is bruised 6. SCALP APPEARANCE: "What does the scalp look like? Is it bleeding now?" If so, ask: "Is it difficult to stop?"     no 7. SIZE: For cuts, bruises, or swelling, ask: "How large is it?" (e.g., inches or centimeters)      none 8. PAIN: "Is there any pain?" If so, ask: "How bad is it?"  (e.g., Scale 1-10; or mild, moderate, severe)    Brow bone extreme sore 9. TETANUS: For any breaks in the skin, ask: "When was the last tetanus booster?"     2014 10. OTHER SYMPTOMS: "Do you have any other symptoms?" (e.g., neck pain, vomiting)    Nauseous and headache 11. PREGNANCY: "Is there any chance you  are pregnant?" "When was your last menstrual period?"     N/A  Protocols used: HEAD INJURY-A-AH

## 2019-05-26 NOTE — Telephone Encounter (Signed)
Noted  

## 2019-05-28 ENCOUNTER — Other Ambulatory Visit: Payer: Self-pay | Admitting: Family Medicine

## 2019-05-28 NOTE — Telephone Encounter (Signed)
Last OV 02/17/19, No future OV  Last filled 02/11/19 # 30 with 2 refills

## 2019-06-17 ENCOUNTER — Other Ambulatory Visit: Payer: Self-pay | Admitting: Family Medicine

## 2019-07-02 ENCOUNTER — Other Ambulatory Visit: Payer: Self-pay | Admitting: Family Medicine

## 2019-07-08 ENCOUNTER — Encounter: Payer: Self-pay | Admitting: Family Medicine

## 2019-07-08 ENCOUNTER — Other Ambulatory Visit: Payer: Self-pay

## 2019-07-08 ENCOUNTER — Ambulatory Visit (INDEPENDENT_AMBULATORY_CARE_PROVIDER_SITE_OTHER): Payer: Managed Care, Other (non HMO) | Admitting: Family Medicine

## 2019-07-08 ENCOUNTER — Ambulatory Visit: Payer: Managed Care, Other (non HMO) | Admitting: Family Medicine

## 2019-07-08 VITALS — BP 110/68 | HR 68 | Temp 98.1°F | Ht 63.75 in | Wt 133.3 lb

## 2019-07-08 DIAGNOSIS — R300 Dysuria: Secondary | ICD-10-CM

## 2019-07-08 DIAGNOSIS — R6883 Chills (without fever): Secondary | ICD-10-CM

## 2019-07-08 DIAGNOSIS — G43009 Migraine without aura, not intractable, without status migrainosus: Secondary | ICD-10-CM | POA: Diagnosis not present

## 2019-07-08 DIAGNOSIS — R1030 Lower abdominal pain, unspecified: Secondary | ICD-10-CM | POA: Diagnosis not present

## 2019-07-08 LAB — POCT URINALYSIS DIPSTICK
Bilirubin, UA: NEGATIVE
Blood, UA: NEGATIVE
Glucose, UA: NEGATIVE
Ketones, UA: NEGATIVE
Leukocytes, UA: NEGATIVE
Nitrite, UA: NEGATIVE
Protein, UA: NEGATIVE
Spec Grav, UA: 1.015 (ref 1.010–1.025)
Urobilinogen, UA: 0.2 E.U./dL
pH, UA: 6 (ref 5.0–8.0)

## 2019-07-08 NOTE — Progress Notes (Signed)
Subjective:     Patient ID: Kim Daniel, female   DOB: 1946/06/16, 73 y.o.   MRN: [id number]  HPI Patient is seen with multiple recent nonspecific symptoms.  First of all, she had migraine headache last Friday.  She took an Imitrex and then eventually a second 1 after symptoms not fully relieved with the first.  By Saturday her migraine had improved.  She has not had any migraine headaches since then.  She developed some chills over the weekend but no fever.  She has had a little bit of nasal congestion.  Decreased appetite.  Denies abdominal pain.  Long history of migraine headaches.  She states she is using gets about 1/month and Imitrex usually works very well for this.  She is not interested in prophylaxis at this time.  Not sure of recent trigger.  She has had recurrent UTIs and was concerned she may have a UTI again.  She is not having any actual burning with urination.  She states she had UTI symptoms last week and self treated with over-the-counter things and symptoms improved.  No recent gross hematuria.  She has felt significantly better past couple days from all the above symptoms.  Appetite better today  Past Medical History:  Diagnosis Date  . Anxiety   . Anxiety and depression   . Chronic constipation   . Depression   . GERD (gastroesophageal reflux disease)   . History of hiatal hernia   . History of migraine headaches   . Seasonal allergic rhinitis   . SUI (stress urinary incontinence, female)   . Ureteral obstruction, right   . Wears contact lenses    Past Surgical History:  Procedure Laterality Date  . ANTERIOR CERVICAL DECOMP/DISCECTOMY FUSION  03-10-2004   C4 -- C6  . BREAST BIOPSY Right 1980  . BREAST EXCISIONAL BIOPSY Right 1980  . COLONOSCOPY WITH PROPOFOL  03-08-2009  . CYSTOSCOPY W/ URETERAL STENT PLACEMENT Right 10/02/2014   Procedure: CYSTOSCOPY WITH RIGHT RETROGRADE PYELOGRAM , URETERAL STENT PLACEMENT;  Surgeon: Claybon Jabs, MD;  Location:  [facility];  Service: Urology;  Laterality: Right;  . ESOPHAGOGASTRODUODENOSCOPY  04-02-2002  . TOTAL HIP ARTHROPLASTY  03/03/2012   Procedure: TOTAL HIP ARTHROPLASTY;  Surgeon: Mauri Pole, MD;  Location: [facility];  Service: Orthopedics;  Laterality: Left;  . TUBAL LIGATION  1971  . VAGINAL HYSTERECTOMY  1976    reports that she quit smoking about 19 years ago. Her smoking use included cigarettes. She has a 10.00 pack-year smoking history. She has never used smokeless tobacco. She reports current alcohol use of about 1.0 standard drinks of alcohol per week. She reports that she does not use drugs. family history includes Breast cancer in her maternal uncle; Cancer (age of onset: 58) in her father; Cirrhosis in her mother. Allergies  Allergen Reactions  . Hydrocodone Nausea And Vomiting and Other (See Comments)    "get dizzy and spaced out" Other reaction(s): Other (See Comments) "get dizzy and spaced out"     Review of Systems  Constitutional: Positive for chills and fatigue. Negative for fever.  Respiratory: Negative for cough and shortness of breath.   Cardiovascular: Negative for chest pain.  Gastrointestinal: Negative for abdominal pain, nausea and vomiting.  Genitourinary: Negative for difficulty urinating, flank pain and hematuria.  Hematological: Negative for adenopathy.       Objective:   Physical Exam Constitutional:      Appearance: Normal appearance.  Neck:     Musculoskeletal:  Neck supple.  Cardiovascular:     Rate and Rhythm: Normal rate and regular rhythm.  Pulmonary:     Effort: Pulmonary effort is normal.     Breath sounds: Normal breath sounds.  Musculoskeletal:     Right lower leg: No edema.     Left lower leg: No edema.  Lymphadenopathy:     Cervical: No cervical adenopathy.  Skin:    Findings: No rash.  Neurological:     Mental Status: She is alert.        Assessment:     #1 recent dysuria.  Urine dipstick today is completely  normal with no evidence for leukocytes, nitrites, or blood  #2 recent migraine headache-symptoms currently resolved  #3 nonspecific symptoms as above of fatigue, chills, decreased appetite in the absence of any fever.  No sick contacts.    Plan:     -We discussed getting possible CBC to further evaluate but since she has no fever and is much improved today we have elected to observe at this time -Reassurance that urine does not show evidence for UTI -Follow-up promptly for any fever or other concerning symptoms -Continue Imitrex as needed for migraine headaches.  Does not sound like she is a candidate for prophylaxis at this time with a frequency of about 1/month.  Kristian CoveyBruce W  MD [facility]

## 2019-07-08 NOTE — Patient Instructions (Signed)
Your urinalysis is normal  Let me know if you have any fever or other new symptoms.

## 2019-07-10 ENCOUNTER — Telehealth: Payer: Self-pay | Admitting: Family Medicine

## 2019-07-10 NOTE — Telephone Encounter (Signed)
This has been taking care of.

## 2019-07-10 NOTE — Telephone Encounter (Signed)
Please have Tommi Rumps review as Elease Hashimoto is out today and tomorrow. Thanks!

## 2019-07-10 NOTE — Telephone Encounter (Signed)
Pt started experiencing a UTI again and stated it is back with a vengeance / Pt would like to know if she can get something called in to treat it again/ please advise and if you send anything to a pharmacy please use [facility] in [address]  [phone]

## 2019-07-11 ENCOUNTER — Ambulatory Visit: Payer: Managed Care, Other (non HMO) | Admitting: Adult Health

## 2019-07-11 NOTE — Telephone Encounter (Signed)
Pt called to cancel appt. This a.m.

## 2019-09-16 ENCOUNTER — Other Ambulatory Visit: Payer: Self-pay | Admitting: Family Medicine

## 2019-09-30 ENCOUNTER — Other Ambulatory Visit: Payer: Self-pay | Admitting: Family Medicine

## 2019-10-13 ENCOUNTER — Telehealth: Payer: Self-pay | Admitting: Family Medicine

## 2019-10-13 ENCOUNTER — Other Ambulatory Visit: Payer: Self-pay

## 2019-10-13 MED ORDER — SUMATRIPTAN SUCCINATE 100 MG PO TABS: ORAL_TABLET | ORAL | 0 refills | Status: AC

## 2019-10-13 NOTE — Telephone Encounter (Signed)
Refill once OK. 

## 2019-10-13 NOTE — Telephone Encounter (Signed)
I have sent requested medication to the pharmacy for the patient.

## 2019-10-13 NOTE — Telephone Encounter (Signed)
Patient has not had this filled since July 2018. Do you want to set up a Doxy visit or OK to refill?

## 2019-10-13 NOTE — Telephone Encounter (Signed)
Medication Refill - Medication: SUMAtriptan (IMITREX) 100 MG tablet    Has the patient contacted their pharmacy? No. (Agent: If no, request that the patient contact the pharmacy for the refill.) (Agent: If yes, when and what did the pharmacy advise?)  Preferred Pharmacy (with phone number or street name):  [facility] [place] [facility] [place] Phone:  [phone]  Fax:  [phone]       Agent: Please be advised that RX refills may take up to 3 business days. We ask that you follow-up with your pharmacy.

## 2019-10-13 NOTE — Telephone Encounter (Signed)
Message Routed to PCP CMA 

## 2019-12-02 ENCOUNTER — Encounter: Payer: Self-pay | Admitting: Family Medicine

## 2019-12-02 ENCOUNTER — Telehealth: Payer: Self-pay

## 2019-12-02 ENCOUNTER — Other Ambulatory Visit: Payer: Self-pay

## 2019-12-02 ENCOUNTER — Telehealth (INDEPENDENT_AMBULATORY_CARE_PROVIDER_SITE_OTHER): Payer: Managed Care, Other (non HMO) | Admitting: Family Medicine

## 2019-12-02 DIAGNOSIS — R079 Chest pain, unspecified: Secondary | ICD-10-CM | POA: Diagnosis not present

## 2019-12-02 DIAGNOSIS — R55 Syncope and collapse: Secondary | ICD-10-CM

## 2019-12-02 NOTE — Telephone Encounter (Signed)
Called patient and she stated that she started having chest discomfort on Sunday night. Patient stated that she was not sure if she was having acid reflux and she went downstairs to get a Pepcid and she passed out. When she came to she was very weak. She did state that her bowels moved after this happened. Patient stated that she did receive her first Covid vaccine on Friday. Patient is feeling some better. Sending as FYI for her telephone visit today.

## 2019-12-02 NOTE — Progress Notes (Signed)
This visit type was conducted due to national recommendations for restrictions regarding the COVID-19 pandemic in an effort to limit this patient's exposure and mitigate transmission in our community.   Virtual Visit via Telephone Note  I connected with Kim Daniel on 12/02/19 at  9:30 AM EST by telephone and verified that I am speaking with the correct person using two identifiers.   I discussed the limitations, risks, security and privacy concerns of performing an evaluation and management service by telephone and the availability of in person appointments. I also discussed with the patient that there may be a patient responsible charge related to this service. The patient expressed understanding and agreed to proceed.  Location patient: home Location provider: work or home office Participants present for the call: patient, provider Patient did not have a visit in the prior 7 days to address this/these issue(s).   History of Present Illness:   Kim Daniel called with episode Sunday night of chest pain that woke her up from sleep.  She states she ate a grilled cheese sandwich around 6 PM but woke up around 3 AM with substernal chest pressure that started building in intensity.  She denied any radiation to the neck or upper extremity.  She thought this may have been some "indigestion ".  She got up and went downstairs to get a Pepcid.  She felt somewhat lightheaded when she was walking.  She took the Pepcid and after several minutes her pain subsided.  She has had absolutely no pain since then.  She felt somewhat weak and "trembling "before her fainting episode.  She does not recall any vomiting.  She has not had any indigestion symptoms since then.  She does not have any known cardiac history.  Non-smoker.  She is not had any prior stress testing.  Past Medical History:  Diagnosis Date  . Anxiety   . Anxiety and depression   . Chronic constipation   . Depression   . GERD  (gastroesophageal reflux disease)   . History of hiatal hernia   . History of migraine headaches   . Seasonal allergic rhinitis   . SUI (stress urinary incontinence, female)   . Ureteral obstruction, right   . Wears contact lenses    Past Surgical History:  Procedure Laterality Date  . ANTERIOR CERVICAL DECOMP/DISCECTOMY FUSION  03-10-2004   C4 -- C6  . BREAST BIOPSY Right 1980  . BREAST EXCISIONAL BIOPSY Right 1980  . COLONOSCOPY WITH PROPOFOL  03-08-2009  . CYSTOSCOPY W/ URETERAL STENT PLACEMENT Right 10/02/2014   Procedure: CYSTOSCOPY WITH RIGHT RETROGRADE PYELOGRAM , URETERAL STENT PLACEMENT;  Surgeon: Claybon Jabs, MD;  Location: [place];  Service: Urology;  Laterality: Right;  . ESOPHAGOGASTRODUODENOSCOPY  04-02-2002  . TOTAL HIP ARTHROPLASTY  03/03/2012   Procedure: TOTAL HIP ARTHROPLASTY;  Surgeon: Mauri Pole, MD;  Location: [facility];  Service: Orthopedics;  Laterality: Left;  . TUBAL LIGATION  1971  . VAGINAL HYSTERECTOMY  1976    reports that she quit smoking about 20 years ago. Her smoking use included cigarettes. She has a 10.00 pack-year smoking history. She has never used smokeless tobacco. She reports current alcohol use of about 1.0 standard drinks of alcohol per week. She reports that she does not use drugs. family history includes Breast cancer in her maternal uncle; Cancer (age of onset: 59) in her father; Cirrhosis in her mother. Allergies  Allergen Reactions  . Hydrocodone Nausea And Vomiting and Other (See Comments)    "  get dizzy and spaced out" Other reaction(s): Other (See Comments) "get dizzy and spaced out"    Observations/Objective: Patient sounds cheerful and well on the phone. I do not appreciate any SOB. Speech and thought processing are grossly intact. Patient reported vitals:  Assessment and Plan:  Patient describes episode 2 days ago of waking up out of sleep with chest pain and then subsequent syncopal episode.  Needs  further evaluation.  She has had no symptoms since then.  -We discussed going immediately to the ER or calling 911 for any recurrent symptoms -We will get her in tomorrow for evaluation and plan EKG and some labs and may need further cardiac testing.  Even though this may have been vasovagal type reaction from her pain we need to assess her chest symptoms more completely.  Follow Up Instructions:  -As above   99441 [id number] [id number] 11-20 99443 21-30 I did not refer this patient for an OV in the next 24 hours for this/these issue(s).  I discussed the assessment and treatment plan with the patient. The patient was provided an opportunity to ask questions and all were answered. The patient agreed with the plan and demonstrated an understanding of the instructions.   The patient was advised to call back or seek an in-person evaluation if the symptoms worsen or if the condition fails to improve as anticipated.  I provided 25 minutes of non-face-to-face time during this encounter.   Evelena Peat, MD

## 2019-12-03 ENCOUNTER — Ambulatory Visit: Payer: Managed Care, Other (non HMO) | Admitting: Family Medicine

## 2019-12-03 ENCOUNTER — Encounter: Payer: Self-pay | Admitting: Family Medicine

## 2019-12-03 VITALS — BP 106/64 | HR 81 | Temp 97.2°F | Ht 63.75 in | Wt 132.8 lb

## 2019-12-03 DIAGNOSIS — R55 Syncope and collapse: Secondary | ICD-10-CM | POA: Diagnosis not present

## 2019-12-03 DIAGNOSIS — R079 Chest pain, unspecified: Secondary | ICD-10-CM

## 2019-12-03 DIAGNOSIS — R5383 Other fatigue: Secondary | ICD-10-CM | POA: Diagnosis not present

## 2019-12-03 LAB — CBC WITH DIFFERENTIAL/PLATELET
Basophils Absolute: 0.1 10*3/uL (ref 0.0–0.1)
Basophils Relative: 0.9 % (ref 0.0–3.0)
Eosinophils Absolute: 0.2 10*3/uL (ref 0.0–0.7)
Eosinophils Relative: 3.3 % (ref 0.0–5.0)
HCT: 41.2 % (ref 36.0–46.0)
Hemoglobin: 13.7 g/dL (ref 12.0–15.0)
Lymphocytes Relative: 36 % (ref 12.0–46.0)
Lymphs Abs: 2.3 10*3/uL (ref 0.7–4.0)
MCHC: 33.3 g/dL (ref 30.0–36.0)
MCV: 87.5 fl (ref 78.0–100.0)
Monocytes Absolute: 0.7 10*3/uL (ref 0.1–1.0)
Monocytes Relative: 10.3 % (ref 3.0–12.0)
Neutro Abs: 3.1 10*3/uL (ref 1.4–7.7)
Neutrophils Relative %: 49.5 % (ref 43.0–77.0)
Platelets: 240 10*3/uL (ref 150.0–400.0)
RBC: 4.71 Mil/uL (ref 3.87–5.11)
RDW: 14.1 % (ref 11.5–15.5)
WBC: 6.3 10*3/uL (ref 4.0–10.5)

## 2019-12-03 LAB — COMPREHENSIVE METABOLIC PANEL
ALT: 9 U/L (ref 0–35)
AST: 21 U/L (ref 0–37)
Albumin: 4.5 g/dL (ref 3.5–5.2)
Alkaline Phosphatase: 38 U/L — ABNORMAL LOW (ref 39–117)
BUN: 11 mg/dL (ref 6–23)
CO2: 29 mEq/L (ref 19–32)
Calcium: 9.4 mg/dL (ref 8.4–10.5)
Chloride: 104 mEq/L (ref 96–112)
Creatinine, Ser: 0.84 mg/dL (ref 0.40–1.20)
GFR: 66.24 mL/min (ref >60.00)
Glucose, Bld: 75 mg/dL (ref 70–99)
Potassium: 4.3 mEq/L (ref 3.5–5.1)
Sodium: 138 mEq/L (ref 135–145)
Total Bilirubin: 0.7 mg/dL (ref 0.2–1.2)
Total Protein: 6.8 g/dL (ref 6.0–8.3)

## 2019-12-03 NOTE — Progress Notes (Signed)
Cardiology Office Note:   Date:  12/05/2019  NAME:  Kim Daniel    MRN: [id number] DOB:  1946-03-12   PCP:  Eulas Post, MD  Cardiologist:  No primary care provider on file.   Referring MD: Eulas Post, MD   Chief Complaint  Patient presents with  . Chest Pain   History of Present Illness:   Kim Daniel is a 73 y.o. female with a hx of acid reflux who is being seen today for the evaluation of chest pain/presyncope at the request of Eulas Post, MD.  She presents for evaluation of chest pain and presyncope.  1 week ago she awoke in the middle of the night with intense chest pressure.  The pain lasted 5 minutes.  She reports there was no trigger identified.  The pain was not worsened by exertion and alleviated by rest.  The pain did not radiate into her arm or jaw.  The pain stayed in the center of her chest.  There was no alleviating factor noted.  The pain simply resolved.  She reports that she got up to take a Pepcid and then felt lightheaded.  No associated shortness of breath or palpitations.  The 140 virtual she was walking to the bathroom and had to sit down.  She reports she felt like she was in a pass out but did not have overt syncope.  She then reports this occurred with the urge to defecate.  She defecated shortly afterwards.  She does have a history of IBS but has never had symptoms this severe.  She reports the chest pressure was rather atypical for her normal heartburn symptoms.  She felt like someone was standing on her chest.  She has never felt this before.  No further symptoms.  No prior episodes of this chest pain before.  Cardiovascular disease risk factors include former smoking.  She does have an LDL of 132 but very high HDL.  She is not diabetic.  She has no high blood pressure.  No other cardiovascular disease risk factors.   Total cholesterol 226, HDL 79, LDL 132, triglycerides 74  Past Medical History: Past Medical History:  Diagnosis Date   . Anxiety   . Anxiety and depression   . Chronic constipation   . Depression   . GERD (gastroesophageal reflux disease)   . History of hiatal hernia   . History of migraine headaches   . Seasonal allergic rhinitis   . SUI (stress urinary incontinence, female)   . Ureteral obstruction, right   . Wears contact lenses     Past Surgical History: Past Surgical History:  Procedure Laterality Date  . ANTERIOR CERVICAL DECOMP/DISCECTOMY FUSION  03-10-2004   C4 -- C6  . BREAST BIOPSY Right 1980  . BREAST EXCISIONAL BIOPSY Right 1980  . COLONOSCOPY WITH PROPOFOL  03-08-2009  . CYSTOSCOPY W/ URETERAL STENT PLACEMENT Right 10/02/2014   Procedure: CYSTOSCOPY WITH RIGHT RETROGRADE PYELOGRAM , URETERAL STENT PLACEMENT;  Surgeon: Claybon Jabs, MD;  Location: [place];  Service: Urology;  Laterality: Right;  . ESOPHAGOGASTRODUODENOSCOPY  04-02-2002  . TOTAL HIP ARTHROPLASTY  03/03/2012   Procedure: TOTAL HIP ARTHROPLASTY;  Surgeon: Mauri Pole, MD;  Location: [facility];  Service: Orthopedics;  Laterality: Left;  . TUBAL LIGATION  1971  . VAGINAL HYSTERECTOMY  1976    Current Medications: Current Meds  Medication Sig  . Calcium Carbonate-Vitamin D (CALTRATE 600+D PO) Take 1 tablet by mouth 3 (three) times  a week.  . citalopram (CELEXA) 40 MG tablet TAKE 1 TABLET BY MOUTH ONCE DAILY  . clonazePAM (KLONOPIN) 0.5 MG tablet TAKE 1 TABLET BY MOUTH EVERY DAY AS NEEDED  . clotrimazole-betamethasone (LOTRISONE) cream Apply one application to skin twice per day as needed for skin irritation.  . fluticasone (FLONASE) 50 MCG/ACT nasal spray PLACE 2 SPRAYS INTO BOTH NOSTRILS DAILY  . LINZESS 290 MCG CAPS capsule Take 290 mcg by mouth every morning.   . pantoprazole (PROTONIX) 40 MG tablet TAKE 1 TABLET BY MOUTH ONCE DAILY  . phenazopyridine (PYRIDIUM) 200 MG tablet Take 1 tablet (200 mg total) by mouth 3 (three) times daily as needed for pain.  . SUMAtriptan (IMITREX) 100 MG tablet  TAKE 1 TABLET BY MOUTH AT ONSET OF MIGRAINE. MAY REPEAT DOSE IN 1 HOUR IF HEADACHE NOT RESOLVED  . zolpidem (AMBIEN) 5 MG tablet TAKE 1 TABLET BY MOUTH AT BEDTIME AS NEEDED FOR SLEEP.     Allergies:    Codeine and Hydrocodone   Social History: Social History   Socioeconomic History  . Marital status: Married    Spouse name: Not on file  . Number of children: 2  . Years of education: Not on file  . Highest education level: Not on file  Occupational History  . Occupation: human resources  Tobacco Use  . Smoking status: Former Smoker    Packs/day: 0.50    Years: 20.00    Pack years: 10.00    Types: Cigarettes    Quit date: 10/02/1999    Years since quitting: 20.1  . Smokeless tobacco: Never Used  Substance and Sexual Activity  . Alcohol use: Yes    Alcohol/week: 1.0 standard drinks    Types: 1 Glasses of wine per week    Comment: occaional  . Drug use: No  . Sexual activity: Not on file  Other Topics Concern  . Not on file  Social History Narrative  . Not on file   Social Determinants of Health   Financial Resource Strain:   . Difficulty of Paying Living Expenses: Not on file  Food Insecurity:   . Worried About Programme researcher, broadcasting/film/video in the Last Year: Not on file  . Ran Out of Food in the Last Year: Not on file  Transportation Needs:   . Lack of Transportation (Medical): Not on file  . Lack of Transportation (Non-Medical): Not on file  Physical Activity:   . Days of Exercise per Week: Not on file  . Minutes of Exercise per Session: Not on file  Stress:   . Feeling of Stress : Not on file  Social Connections:   . Frequency of Communication with Friends and Family: Not on file  . Frequency of Social Gatherings with Friends and Family: Not on file  . Attends Religious Services: Not on file  . Active Member of Clubs or Organizations: Not on file  . Attends Banker Meetings: Not on file  . Marital Status: Not on file     Family History: The patient's  family history includes Breast cancer in her maternal uncle; Cancer (age of onset: 3) in her father; Cirrhosis in her mother.  ROS:   All other ROS reviewed and negative. Pertinent positives noted in the HPI.     EKGs/Labs/Other Studies Reviewed:   The following studies were personally reviewed by me today:  EKG:  EKG is ordered today.  The ekg ordered today demonstrates normal sinus rhythm, heart rate 68, left axis deviation  noted, questionable anterior infarct, and was personally reviewed by me.   Recent Labs: 12/03/2019: ALT 9; BUN 11; Creatinine, Ser 0.84; Hemoglobin 13.7; Platelets 240.0; Potassium 4.3; Sodium 138   Recent Lipid Panel    Component Value Date/Time   CHOL 226 (H) 05/17/2018 1049   TRIG 74.0 05/17/2018 1049   HDL 79.20 05/17/2018 1049   CHOLHDL 3 05/17/2018 1049   VLDL 14.8 05/17/2018 1049   LDLCALC 132 (H) 05/17/2018 1049   LDLDIRECT 115.3 02/19/2012 0856    Physical Exam:   VS:  BP 114/74 (BP Location: Left Arm)   Ht 5\' 4"  (1.626 m)   Wt 136 lb (61.7 kg)   BMI 23.34 kg/m    Wt Readings from Last 3 Encounters:  12/05/19 136 lb (61.7 kg)  12/03/19 132 lb 12.8 oz (60.2 kg)  07/08/19 133 lb 4.8 oz (60.5 kg)    General: Well nourished, well developed, in no acute distress Heart: Atraumatic, normal size  Eyes: PEERLA, EOMI  Neck: Supple, no JVD Endocrine: No thryomegaly Cardiac: Normal S1, S2; RRR; no murmurs, rubs, or gallops Lungs: Clear to auscultation bilaterally, no wheezing, rhonchi or rales  Abd: Soft, nontender, no hepatomegaly  Ext: No edema, pulses 2+ Musculoskeletal: No deformities, BUE and BLE strength normal and equal Skin: Warm and dry, no rashes   Neuro: Alert and oriented to person, place, time, and situation, CNII-XII grossly intact, no focal deficits  Psych: Normal mood and affect   ASSESSMENT:   Kim Daniel is a 74 y.o. female who presents for the following: 1. Chest pain, unspecified type   2. Pre-syncope   3. Nonspecific  abnormal electrocardiogram (ECG) (EKG)     PLAN:   1. Chest pain, unspecified type 2. Pre-syncope 3. Nonspecific abnormal electrocardiogram (ECG) (EKG) -She presents for evaluation of a one-time episode of chest pressure.  The pressure occurred at rest and was not worsened by exertion.  It simply resolved without intervention.  She then experienced a presyncopal event.  She did not have overt syncope but did feel lightheaded and dizzy.  She also reports this was in the setting of having to use the bathroom.  Overall, she does have CVD risk factors that include former smoking and hyperlipidemia.  She reports she is concerned and wants to make sure her heart is okay.  Given the severity of the symptoms of rapid onset, we will pursue a cardiac CTA to exclude obstructive CAD as an etiology here.  She will take 50 mg of metoprolol tartrate to our before the scan.  We will also obtain echocardiogram.  Her EKG today is without acute ischemic changes.  There is poor R wave progression noted which could represent an old anterior infarct versus breast artifact.  We will further elucidate these findings with an echocardiogram.  She had no overt syncope and is okay to drive.  We will plan to follow-up the results of her testing by phone.  I will see her back in 3 months to discuss her overall symptoms and testing that is being pursued.   Disposition: Return in about 3 months (around 03/03/2020).  Medication Adjustments/Labs and Tests Ordered: Current medicines are reviewed at length with the patient today.  Concerns regarding medicines are outlined above.  Orders Placed This Encounter  Procedures  . CT CORONARY MORPH W/CTA COR W/SCORE W/CA W/CM &/OR WO/CM  . CT CORONARY FRACTIONAL FLOW RESERVE DATA PREP  . CT CORONARY FRACTIONAL FLOW RESERVE FLUID ANALYSIS  . Basic metabolic panel  .  EKG 12-Lead  . ECHOCARDIOGRAM COMPLETE   Meds ordered this encounter  Medications  . metoprolol tartrate (LOPRESSOR) 50 MG  tablet    Sig: Take 1 tablet by mouth once for procedure.    Dispense:  1 tablet    Refill:  0    Patient Instructions  Medication Instructions Take Metoprolol 50 mg two hours before your CT  *If you need a refill on your cardiac medications before your next appointment, please call your pharmacy*  Lab Work: BMET one week before CT If you have labs (blood work) drawn today and your tests are completely normal, you will receive your results only by: Marland Kitchen MyChart Message (if you have MyChart) OR . A paper copy in the mail If you have any lab test that is abnormal or we need to change your treatment, we will call you to review the results.  Testing/Procedures: Echocardiogram - Your physician has requested that you have an echocardiogram. Echocardiography is a painless test that uses sound waves to create images of your heart. It provides your doctor with information about the size and shape of your heart and how well your heart's chambers and valves are working. This procedure takes approximately one hour. There are no restrictions for this procedure. This will be performed at our [facility] location - [address], [address].  Your physician has requested that you have cardiac CT. Cardiac computed tomography (CT) is a painless test that uses an x-ray machine to take clear, detailed pictures of your heart. For further information please visit [email or web address] Please follow instruction sheet as given.   Follow-Up: At [facility], you and your health needs are our priority.  As part of our continuing mission to provide you with exceptional heart care, we have created designated Provider Care Teams.  These Care Teams include your primary Cardiologist (physician) and Advanced Practice Providers (APPs -  Physician Assistants and Nurse Practitioners) who all work together to provide you with the care you need, when you need it.  Your next appointment:   3 month(s)  The format for your  next appointment:   Virtual Visit   Provider:   Lennie Odor, MD  Other Instructions  Your cardiac CT will be scheduled at one of the below locations:   [facility] [address] [place], [place] [address] [phone]  If scheduled at [facility], please arrive at the [facility] main entrance of [facility] 30 minutes prior to test start time. Proceed to the [facility] Radiology Department (first floor) to check-in and test prep.  Please follow these instructions carefully (unless otherwise directed):  Hold all erectile dysfunction medications at least 3 days (72 hrs) prior to test.  On the Night Before the Test: . Be sure to Drink plenty of water. . Do not consume any caffeinated/decaffeinated beverages or chocolate 12 hours prior to your test. . Do not take any antihistamines 12 hours prior to your test. . If you take Metformin do not take 24 hours prior to test.  On the Day of the Test: . Drink plenty of water. Do not drink any water within one hour of the test. . Do not eat any food 4 hours prior to the test. . You may take your regular medications prior to the test.  . Take metoprolol (Lopressor) two hours prior to test. . HOLD Furosemide/Hydrochlorothiazide morning of the test. . FEMALES- please wear underwire-free bra if available  After the Test: . Drink plenty of water. . After receiving IV contrast, you may experience a mild flushed feeling. This is normal. . On occasion, you may experience a mild rash up to 24 hours after the test. This is not dangerous. If this occurs, you can take Benadryl 25 mg and increase your fluid intake. . If you experience trouble breathing, this can be serious. If it is severe call 911 IMMEDIATELY. If it is mild, please call our office. . If you take any of these medications: Glipizide/Metformin, Avandament, Glucavance, please do not take 48 hours after completing test unless otherwise  instructed.   Once we have confirmed authorization from your insurance company, we will call you to set up a date and time for your test.   For non-scheduling related questions, please contact the cardiac imaging nurse navigator should you have any questions/concerns: Rockwell Alexandria, RN Navigator Cardiac Imaging [facility] V[facility] [phone] mobile       Signed, Lenna Gilford. Flora Lipps, MD [facility]  [address] [place], [place] [address] [phone]  12/05/2019 1:48 PM

## 2019-12-03 NOTE — Progress Notes (Signed)
Subjective:     Patient ID: Kim Daniel, female   DOB: 1945-11-23, 74 y.o.   MRN: [id number]  HPI  Ms. Atkins is here for follow-up from virtual visit yesterday.  Refer to that note for details  "Ms. Mcfadden called with episode Sunday night of chest pain that woke her up from sleep.  She states she ate a grilled cheese sandwich around 6 PM but woke up around 3 AM with substernal chest pressure that started building in intensity.  She denied any radiation to the neck or upper extremity.  She thought this may have been some "indigestion ".  She got up and went downstairs to get a Pepcid.  She felt somewhat lightheaded when she was walking.  She took the Pepcid and after several minutes her pain subsided.  She has had absolutely no pain since then.  She felt somewhat weak and "trembling "before her fainting episode.  She does not recall any vomiting.  She has not had any indigestion symptoms since then.  She does not have any known cardiac history.  Non-smoker.  She is not had any prior stress testing."  On further questioning, she is not completely sure that she passed out.  She felt extremely dizzy when she went downstairs to get the Pepcid and recalls being squatted but not prostate on the ground when she was fully conscious.  Her chest pain episode lasted about 5 minutes.  She has had no chest pain whatsoever since then.  Yesterday she went to work and was very fatigued throughout the day.  We did verify that she actually did have nuclear medicine stress test way back in 2003 which was unremarkable.  She has no family history of premature CAD.  Non-smoker.  No history of diabetes.  She has very sedentary and does not exercise currently.  She has had no dizziness since that episode.  Past Medical History:  Diagnosis Date  . Anxiety   . Anxiety and depression   . Chronic constipation   . Depression   . GERD (gastroesophageal reflux disease)   . History of hiatal hernia   . History of  migraine headaches   . Seasonal allergic rhinitis   . SUI (stress urinary incontinence, female)   . Ureteral obstruction, right   . Wears contact lenses    Past Surgical History:  Procedure Laterality Date  . ANTERIOR CERVICAL DECOMP/DISCECTOMY FUSION  03-10-2004   C4 -- C6  . BREAST BIOPSY Right 1980  . BREAST EXCISIONAL BIOPSY Right 1980  . COLONOSCOPY WITH PROPOFOL  03-08-2009  . CYSTOSCOPY W/ URETERAL STENT PLACEMENT Right 10/02/2014   Procedure: CYSTOSCOPY WITH RIGHT RETROGRADE PYELOGRAM , URETERAL STENT PLACEMENT;  Surgeon: Claybon Jabs, MD;  Location: [facility];  Service: Urology;  Laterality: Right;  . ESOPHAGOGASTRODUODENOSCOPY  04-02-2002  . TOTAL HIP ARTHROPLASTY  03/03/2012   Procedure: TOTAL HIP ARTHROPLASTY;  Surgeon: Mauri Pole, MD;  Location: [facility];  Service: Orthopedics;  Laterality: Left;  . TUBAL LIGATION  1971  . VAGINAL HYSTERECTOMY  1976    reports that she quit smoking about 20 years ago. Her smoking use included cigarettes. She has a 10.00 pack-year smoking history. She has never used smokeless tobacco. She reports current alcohol use of about 1.0 standard drinks of alcohol per week. She reports that she does not use drugs. family history includes Breast cancer in her maternal uncle; Cancer (age of onset: 69) in her father; Cirrhosis in her mother. Allergies  Allergen  Reactions  . Codeine Other (See Comments)  . Hydrocodone Nausea And Vomiting and Other (See Comments)    "get dizzy and spaced out" Other reaction(s): Other (See Comments) "get dizzy and spaced out"      Review of Systems  Constitutional: Positive for fatigue. Negative for chills and fever.  Respiratory: Positive for chest tightness. Negative for cough, shortness of breath and wheezing.   Cardiovascular: Positive for chest pain. Negative for palpitations and leg swelling.  Gastrointestinal: Negative for abdominal pain, nausea and vomiting.  Genitourinary: Negative  for dysuria.  Neurological: Positive for syncope. Negative for seizures, weakness and headaches.       Objective:   Physical Exam Vitals reviewed.  Constitutional:      Appearance: Normal appearance.  Cardiovascular:     Rate and Rhythm: Normal rate and regular rhythm.     Heart sounds: No murmur. No gallop.   Pulmonary:     Effort: Pulmonary effort is normal.     Breath sounds: Normal breath sounds.  Musculoskeletal:     Right lower leg: No edema.     Left lower leg: No edema.  Neurological:     General: No focal deficit present.     Mental Status: She is alert and oriented to person, place, and time. Mental status is at baseline.     Cranial Nerves: No cranial nerve deficit.     Motor: No weakness.     Coordination: Coordination normal.     Gait: Gait normal.        Assessment:     Ms. Krysiak presents with episode of transient substernal chest pressure lasting about 5 minutes early Monday morning with associated presyncopal type event.  She does have history of reflux and takes Protonix regularly.  She has had no recurrence of dizziness or chest pressure since episode around 3 AM Monday morning but has had some nonspecific fatigue    Plan:     -Obtain EKG.  This shows some nonspecific T wave changes.  She is in sinus rhythm with rate around 68  -We recommend a cardiology referral for further evaluation given unexplained episode of chest pressure at rest above.  This may be reflux or esophageal related but she is already on Protonix 40 mg daily  -Patient knows to follow-up immediately or call 911 for any recurrent chest pressure or other concerns  -We will also go ahead and check CBC and comprehensive metabolic panel in view of her recent presyncopal type event  Kristian Covey MD [facility] '

## 2019-12-03 NOTE — Patient Instructions (Signed)

## 2019-12-05 ENCOUNTER — Ambulatory Visit: Payer: Managed Care, Other (non HMO) | Admitting: Cardiovascular Disease

## 2019-12-05 ENCOUNTER — Other Ambulatory Visit: Payer: Self-pay

## 2019-12-05 ENCOUNTER — Encounter: Payer: Self-pay | Admitting: Cardiovascular Disease

## 2019-12-05 VITALS — BP 114/74 | Ht 64.0 in | Wt 136.0 lb

## 2019-12-05 DIAGNOSIS — R9431 Abnormal electrocardiogram [ECG] [EKG]: Secondary | ICD-10-CM

## 2019-12-05 DIAGNOSIS — R079 Chest pain, unspecified: Secondary | ICD-10-CM | POA: Diagnosis not present

## 2019-12-05 DIAGNOSIS — R55 Syncope and collapse: Secondary | ICD-10-CM | POA: Diagnosis not present

## 2019-12-05 MED ORDER — METOPROLOL TARTRATE 50 MG PO TABS: ORAL_TABLET | ORAL | 0 refills | Status: DC

## 2019-12-05 NOTE — Patient Instructions (Signed)
Medication Instructions Take Metoprolol 50 mg two hours before your CT  *If you need a refill on your cardiac medications before your next appointment, please call your pharmacy*  Lab Work: BMET one week before CT If you have labs (blood work) drawn today and your tests are completely normal, you will receive your results only by: Marland Kitchen MyChart Message (if you have MyChart) OR . A paper copy in the mail If you have any lab test that is abnormal or we need to change your treatment, we will call you to review the results.  Testing/Procedures: Echocardiogram - Your physician has requested that you have an echocardiogram. Echocardiography is a painless test that uses sound waves to create images of your heart. It provides your doctor with information about the size and shape of your heart and how well your heart's chambers and valves are working. This procedure takes approximately one hour. There are no restrictions for this procedure. This will be performed at our [facility] location - [address], [address].  Your physician has requested that you have cardiac CT. Cardiac computed tomography (CT) is a painless test that uses an x-ray machine to take clear, detailed pictures of your heart. For further information please visit [email or web address] Please follow instruction sheet as given.   Follow-Up: At [facility], you and your health needs are our priority.  As part of our continuing mission to provide you with exceptional heart care, we have created designated Provider Care Teams.  These Care Teams include your primary Cardiologist (physician) and Advanced Practice Providers (APPs -  Physician Assistants and Nurse Practitioners) who all work together to provide you with the care you need, when you need it.  Your next appointment:   3 month(s)  The format for your next appointment:   Virtual Visit   Provider:   Lennie Odor, MD  Other Instructions  Your cardiac CT will be scheduled  at one of the below locations:   [facility] [address] [place], [place] [address] [phone]  If scheduled at [facility], please arrive at the [facility] main entrance of [facility] 30 minutes prior to test start time. Proceed to the [facility] Department (first floor) to check-in and test prep.  Please follow these instructions carefully (unless otherwise directed):  Hold all erectile dysfunction medications at least 3 days (72 hrs) prior to test.  On the Night Before the Test: . Be sure to Drink plenty of water. . Do not consume any caffeinated/decaffeinated beverages or chocolate 12 hours prior to your test. . Do not take any antihistamines 12 hours prior to your test. . If you take Metformin do not take 24 hours prior to test.  On the Day of the Test: . Drink plenty of water. Do not drink any water within one hour of the test. . Do not eat any food 4 hours prior to the test. . You may take your regular medications prior to the test.  . Take metoprolol (Lopressor) two hours prior to test. . HOLD Furosemide/Hydrochlorothiazide morning of the test. . FEMALES- please wear underwire-free bra if available       After the Test: . Drink plenty of water. . After receiving IV contrast, you may experience a mild flushed feeling. This is normal. . On occasion, you may experience a mild rash up to 24 hours after the test. This is not dangerous. If this occurs, you can take Benadryl 25 mg and  increase your fluid intake. . If you experience trouble breathing, this can be serious. If it is severe call 911 IMMEDIATELY. If it is mild, please call our office. . If you take any of these medications: Glipizide/Metformin, Avandament, Glucavance, please do not take 48 hours after completing test unless otherwise instructed.   Once we have confirmed authorization from your insurance company, we will call you to set up a date and time for your test.    For non-scheduling related questions, please contact the cardiac imaging nurse navigator should you have any questions/concerns: Marchia Bond, RN Navigator Cardiac Imaging [facility] [phone] mobile

## 2019-12-15 ENCOUNTER — Other Ambulatory Visit: Payer: Self-pay | Admitting: Family Medicine

## 2019-12-15 NOTE — Telephone Encounter (Signed)
Refill for 6 months. 

## 2019-12-16 ENCOUNTER — Ambulatory Visit (HOSPITAL_COMMUNITY): Payer: Managed Care, Other (non HMO) | Attending: Cardiology

## 2019-12-16 ENCOUNTER — Other Ambulatory Visit: Payer: Self-pay

## 2019-12-16 DIAGNOSIS — R079 Chest pain, unspecified: Secondary | ICD-10-CM | POA: Diagnosis not present

## 2020-01-08 ENCOUNTER — Other Ambulatory Visit: Payer: Self-pay | Admitting: Family Medicine

## 2020-01-09 ENCOUNTER — Ambulatory Visit (HOSPITAL_COMMUNITY): Payer: Managed Care, Other (non HMO)

## 2020-01-22 ENCOUNTER — Other Ambulatory Visit: Payer: Self-pay | Admitting: Family Medicine

## 2020-02-26 ENCOUNTER — Other Ambulatory Visit: Payer: Self-pay | Admitting: Family Medicine

## 2020-02-26 NOTE — Progress Notes (Signed)
Virtual Visit via Telephone Note   This visit type was conducted due to national recommendations for restrictions regarding the COVID-19 Pandemic (e.g. social distancing) in an effort to limit this patient's exposure and mitigate transmission in our community.  Due to her co-morbid illnesses, this patient is at least at moderate risk for complications without adequate follow up.  This format is felt to be most appropriate for this patient at this time.  The patient did not have access to video technology/had technical difficulties with video requiring transitioning to audio format only (telephone).  All issues noted in this document were discussed and addressed.  No physical exam could be performed with this format.  Please refer to the patient's chart for her  consent to telehealth for [facility].   The patient was identified using 2 identifiers.  Date:  02/27/2020   ID:  Kim Daniel, DOB Mar 31, 1946, MRN [id number]  Patient Location: Home Provider Location: Office  PCP:  Kristian Covey, MD  Cardiologist:  No primary care provider on file.   Evaluation Performed:  Follow-Up Visit  Chief Complaint:  Follow-up   History of Present Illness:    Kim Daniel is a 74 y.o. female with a hx of GERD, HLD who is being seen today for follow-up of chest pain. She was seen 12/05/2019 for atypical chest pain and pre-syncope. Echo normal CCTA ordered but not done. She reports she had an episode of chest pain that woke her up in the night. Associated symptoms included pre-syncope. She had this one time. No further episodes since that time. She reports that since she had no further symptoms, she decided against the CCTA. She reports no structured exercise but no SOB or CP with her current level of activity.  She is reassured with the results of her echocardiogram which show normal LV function no significant valvular heart disease.  I did go over the results with her today by phone.  Problem  List 1. HLD -Total cholesterol 226, HDL 79, LDL 132, triglycerides 74  The patient does not have symptoms concerning for COVID-19 infection (fever, chills, cough, or new shortness of breath).    Past Medical History:  Diagnosis Date  . Anxiety   . Anxiety and depression   . Chronic constipation   . Depression   . GERD (gastroesophageal reflux disease)   . History of hiatal hernia   . History of migraine headaches   . Seasonal allergic rhinitis   . SUI (stress urinary incontinence, female)   . Ureteral obstruction, right   . Wears contact lenses    Past Surgical History:  Procedure Laterality Date  . ANTERIOR CERVICAL DECOMP/DISCECTOMY FUSION  03-10-2004   C4 -- C6  . BREAST BIOPSY Right 1980  . BREAST EXCISIONAL BIOPSY Right 1980  . COLONOSCOPY WITH PROPOFOL  03-08-2009  . CYSTOSCOPY W/ URETERAL STENT PLACEMENT Right 10/02/2014   Procedure: CYSTOSCOPY WITH RIGHT RETROGRADE PYELOGRAM , URETERAL STENT PLACEMENT;  Surgeon: Garnett Farm, MD;  Location: [facility] [address];  Service: Urology;  Laterality: Right;  . ESOPHAGOGASTRODUODENOSCOPY  04-02-2002  . TOTAL HIP ARTHROPLASTY  03/03/2012   Procedure: TOTAL HIP ARTHROPLASTY;  Surgeon: [name], MD;  Location: [facility];  Service: Orthopedics;  Laterality: Left;  . TUBAL LIGATION  1971  . VAGINAL HYSTERECTOMY  1976     Current Meds  Medication Sig  . buPROPion (WELLBUTRIN XL) 150 MG 24 hr tablet TAKE 1 TABLET BY MOUTH ONCE DAILY  . Calcium  Carbonate-Vitamin D (CALTRATE 600+D PO) Take 1 tablet by mouth 3 (three) times a week.  . citalopram (CELEXA) 40 MG tablet TAKE 1 TABLET BY MOUTH ONCE DAILY  . clonazePAM (KLONOPIN) 0.5 MG tablet TAKE 1 TABLET BY MOUTH EVERY DAY AS NEEDED  . clotrimazole-betamethasone (LOTRISONE) cream Apply one application to skin twice per day as needed for skin irritation.  . fluticasone (FLONASE) 50 MCG/ACT nasal spray PLACE 2 SPRAYS INTO BOTH NOSTRILS DAILY  . LINZESS 290 MCG CAPS capsule  Take 290 mcg by mouth every morning.   . metoprolol tartrate (LOPRESSOR) 50 MG tablet Take 1 tablet by mouth once for procedure.  . pantoprazole (PROTONIX) 40 MG tablet TAKE 1 TABLET BY MOUTH ONCE DAILY  . phenazopyridine (PYRIDIUM) 200 MG tablet Take 1 tablet (200 mg total) by mouth 3 (three) times daily as needed for pain.  . SUMAtriptan (IMITREX) 100 MG tablet TAKE 1 TABLET BY MOUTH AT ONSET OF MIGRAINE. MAY REPEAT DOSE IN 1 HOUR IF HEADACHE NOT RESOLVED  . zolpidem (AMBIEN) 5 MG tablet TAKE 1 TABLET BY MOUTH AT BEDTIME AS NEEDED FOR SLEEP.    Allergies:   Codeine and Hydrocodone   Social History   Tobacco Use  . Smoking status: Former Smoker    Packs/day: 0.50    Years: 20.00    Pack years: 10.00    Types: Cigarettes    Quit date: 10/02/1999    Years since quitting: 20.4  . Smokeless tobacco: Never Used  Substance Use Topics  . Alcohol use: Yes    Alcohol/week: 1.0 standard drinks    Types: 1 Glasses of wine per week    Comment: occaional  . Drug use: No     Family Hx: The patient's family history includes Breast cancer in her maternal uncle; Cancer (age of onset: 60) in her father; Cirrhosis in her mother.  ROS:   Please see the history of present illness.     All other systems reviewed and are negative.   Prior CV studies:   The following studies were reviewed today:  TTE 12/16/2019 1. Left ventricular ejection fraction, by estimation, is 60 to 65%. The  left ventricle has normal function. The left ventricle has no regional  wall motion abnormalities. Left ventricular diastolic parameters are  consistent with Grade I diastolic  dysfunction (impaired relaxation). The average left ventricular global  longitudinal strain is -21.3 %.  2. Right ventricular systolic function is normal. The right ventricular  size is normal. There is normal pulmonary artery systolic pressure.  3. The mitral valve is normal in structure and function. Mild mitral  valve regurgitation.  No evidence of mitral stenosis.  4. The aortic valve is normal in structure and function. Aortic valve  regurgitation is not visualized. No aortic stenosis is present.  5. The inferior vena cava is normal in size with greater than 50%  respiratory variability, suggesting right atrial pressure of 3 mmHg.   Labs/Other Tests and Data Reviewed:    EKG:  No ECG reviewed.  Recent Labs: 12/03/2019: ALT 9; BUN 11; Creatinine, Ser 0.84; Hemoglobin 13.7; Platelets 240.0; Potassium 4.3; Sodium 138   Recent Lipid Panel Lab Results  Component Value Date/Time   CHOL 226 (H) 05/17/2018 10:49 AM   TRIG 74.0 05/17/2018 10:49 AM   HDL 79.20 05/17/2018 10:49 AM   CHOLHDL 3 05/17/2018 10:49 AM   LDLCALC 132 (H) 05/17/2018 10:49 AM   LDLDIRECT 115.3 02/19/2012 08:56 AM    Wt Readings from Last 3 Encounters:  12/05/19 136 lb (61.7 kg)  12/03/19 132 lb 12.8 oz (60.2 kg)  07/08/19 133 lb 4.8 oz (60.5 kg)     Objective:    Vital Signs:  There were no vitals taken for this visit.   VITAL SIGNS:  reviewed  Gen: NAD Pulm: No SOB over the phone Psych: normal mood/affect    ASSESSMENT & PLAN:    1. Chest pain, unspecified type -Atypical chest pressure that occurred in the middle the night a few months ago.  No further episodes.  Echocardiogram normal.  Her EKG at her last visit demonstrated no acute ischemic changes.  She has had no further recurrences of her chest pain.  She reports that since she has been doing well and has no more symptoms she did not see the need for cardiac CTA.  I think this is reasonable.  Should she have further symptoms we will gladly reevaluate her.  She will see [place] as needed   COVID-19 Education: The signs and symptoms of COVID-19 were discussed with the patient and how to seek care for testing (follow up with PCP or arrange E-visit).  The importance of social distancing was discussed today.  Time:  Today, I have spent 25 minutes with the patient with telehealth  technology discussing the above problems.     Medication Adjustments/Labs and Tests Ordered: Current medicines are reviewed at length with the patient today.  Concerns regarding medicines are outlined above.   Tests Ordered: No orders of the defined types were placed in this encounter.   Medication Changes: No orders of the defined types were placed in this encounter.   Follow Up:    Signed, Reatha Harps, MD  02/27/2020 10:37 AM    [facility]

## 2020-02-27 ENCOUNTER — Encounter: Payer: Self-pay | Admitting: Cardiovascular Disease

## 2020-02-27 ENCOUNTER — Telehealth (INDEPENDENT_AMBULATORY_CARE_PROVIDER_SITE_OTHER): Payer: Managed Care, Other (non HMO) | Admitting: Cardiovascular Disease

## 2020-02-27 DIAGNOSIS — R079 Chest pain, unspecified: Secondary | ICD-10-CM

## 2020-02-27 NOTE — Patient Instructions (Signed)
Medication Instructions:  The current medical regimen is effective;  continue present plan and medications.  *If you need a refill on your cardiac medications before your next appointment, please call your pharmacy*    Follow-Up: At [facility], you and your health needs are our priority.  As part of our continuing mission to provide you with exceptional heart care, we have created designated Provider Care Teams.  These Care Teams include your primary Cardiologist (physician) and Advanced Practice Providers (APPs -  Physician Assistants and Nurse Practitioners) who all work together to provide you with the care you need, when you need it.  We recommend signing up for the patient portal called "MyChart".  Sign up information is provided on this After Visit Summary.  MyChart is used to connect with patients for Virtual Visits (Telemedicine).  Patients are able to view lab/test results, encounter notes, upcoming appointments, etc.  Non-urgent messages can be sent to your provider as well.   To learn more about what you can do with MyChart, go to [email or web address].    Your next appointment:   As needed  The format for your next appointment:   In Person  Provider:   Lynch O'Neal, MD      

## 2020-04-09 ENCOUNTER — Encounter (HOSPITAL_COMMUNITY): Payer: Managed Care, Other (non HMO)

## 2020-05-13 ENCOUNTER — Other Ambulatory Visit: Payer: Self-pay | Admitting: Family Medicine

## 2020-05-14 NOTE — Telephone Encounter (Signed)
Rx last filled 03/31/20.

## 2020-07-06 ENCOUNTER — Other Ambulatory Visit: Payer: Self-pay | Admitting: Family Medicine

## 2020-10-22 ENCOUNTER — Other Ambulatory Visit: Payer: Self-pay | Admitting: Family Medicine

## 2020-10-26 NOTE — Telephone Encounter (Signed)
Refilled once.  Will need follow up in 1-2 months (last seen 2/21).

## 2020-11-15 ENCOUNTER — Other Ambulatory Visit: Payer: Managed Care, Other (non HMO)

## 2020-11-15 ENCOUNTER — Other Ambulatory Visit: Payer: Self-pay | Admitting: Internal Medicine

## 2020-11-15 DIAGNOSIS — Z20822 Contact with and (suspected) exposure to covid-19: Secondary | ICD-10-CM

## 2020-11-16 LAB — SPECIMEN STATUS REPORT

## 2020-11-16 LAB — SARS-COV-2, NAA 2 DAY TAT

## 2020-11-16 LAB — NOVEL CORONAVIRUS, NAA: SARS-CoV-2, NAA: DETECTED — AB

## 2020-11-17 ENCOUNTER — Telehealth: Payer: Self-pay | Admitting: Family Medicine

## 2020-11-17 NOTE — Telephone Encounter (Signed)
Appointment schedule for patient to discuss covid symptoms and treatment.

## 2020-11-17 NOTE — Telephone Encounter (Signed)
Pt call and stated she was tested positive for covid and want dr.Burchette to call her something in at [id number] - [place], [place] - [address] Phone:  [phone]  Fax:  [phone]

## 2020-11-18 ENCOUNTER — Telehealth (INDEPENDENT_AMBULATORY_CARE_PROVIDER_SITE_OTHER): Payer: Managed Care, Other (non HMO) | Admitting: Family Medicine

## 2020-11-18 DIAGNOSIS — U071 COVID-19: Secondary | ICD-10-CM

## 2020-11-18 MED ORDER — BENZONATATE 100 MG PO CAPS: 100.0000 mg | ORAL_CAPSULE | Freq: Three times a day (TID) | ORAL | 0 refills | Status: DC | PRN

## 2020-11-18 NOTE — Progress Notes (Signed)
Virtual Visit via Video Note  I connected with [place]  on 11/18/20 at 12:00 PM EST by a video enabled telemedicine application and verified that I am speaking with the correct person using two identifiers.  Location patient: home,  Location provider:work or home office Persons participating in the virtual visit: patient, provider  I discussed the limitations of evaluation and management by telemedicine and the availability of in person appointments. The patient expressed understanding and agreed to proceed.   HPI:  Acute telemedicine visit for Covid19: -Onset: 11/07/20 (was at [employer])  -Symptoms include: body aches, poor appetite, low energy, nasal congestion, cough, she had a negative test and started to feel better, but then was feeling tired again the last 5-6 days and had a positive covid test on the 24th -now mainly occasional cough, scratchy throat, body aches -Denies:fevers, CP, SOB, NVD, inability to eat/drink/get out of bed -Has tried: emergenC, vit d, zinc, OTC cold remedy -Pertinent past medical history:denies all -Pertinent medication allergies: codeine, hydrocone -COVID-19 vaccine status: fully vaccinated and had booster  ROS: See pertinent positives and negatives per HPI.  Past Medical History:  Diagnosis Date  . Anxiety   . Anxiety and depression   . Chronic constipation   . Depression   . GERD (gastroesophageal reflux disease)   . History of hiatal hernia   . History of migraine headaches   . Seasonal allergic rhinitis   . SUI (stress urinary incontinence, female)   . Ureteral obstruction, right   . Wears contact lenses     Past Surgical History:  Procedure Laterality Date  . ANTERIOR CERVICAL DECOMP/DISCECTOMY FUSION  03-10-2004   C4 -- C6  . BREAST BIOPSY Right 1980  . BREAST EXCISIONAL BIOPSY Right 1980  . COLONOSCOPY WITH PROPOFOL  03-08-2009  . CYSTOSCOPY W/ URETERAL STENT PLACEMENT Right 10/02/2014   Procedure: CYSTOSCOPY WITH RIGHT  RETROGRADE PYELOGRAM , URETERAL STENT PLACEMENT;  Surgeon: Garnett Farm, MD;  Location: [facility];  Service: Urology;  Laterality: Right;  . ESOPHAGOGASTRODUODENOSCOPY  04-02-2002  . TOTAL HIP ARTHROPLASTY  03/03/2012   Procedure: TOTAL HIP ARTHROPLASTY;  Surgeon: [name], MD;  Location: [facility];  Service: Orthopedics;  Laterality: Left;  . TUBAL LIGATION  1971  . VAGINAL HYSTERECTOMY  1976     Current Outpatient Medications:  .  benzonatate (TESSALON PERLES) 100 MG capsule, Take 1 capsule (100 mg total) by mouth 3 (three) times daily as needed., Disp: 20 capsule, Rfl: 0 .  buPROPion (WELLBUTRIN XL) 150 MG 24 hr tablet, TAKE 1 TABLET BY MOUTH ONCE DAILY, Disp: 90 tablet, Rfl: 1 .  Calcium Carbonate-Vitamin D (CALTRATE 600+D PO), Take 1 tablet by mouth 3 (three) times a week., Disp: , Rfl:  .  citalopram (CELEXA) 40 MG tablet, TAKE 1 TABLET BY MOUTH ONCE DAILY, Disp: 90 tablet, Rfl: 0 .  clonazePAM (KLONOPIN) 0.5 MG tablet, TAKE 1 TABLET BY MOUTH EVERY DAY AS NEEDED, Disp: , Rfl:  .  clotrimazole-betamethasone (LOTRISONE) cream, Apply one application to skin twice per day as needed for skin irritation., Disp: 30 g, Rfl: 1 .  fluticasone (FLONASE) 50 MCG/ACT nasal spray, PLACE 2 SPRAYS INTO BOTH NOSTRILS DAILY, Disp: 48 g, Rfl: 3 .  LINZESS 290 MCG CAPS capsule, Take 290 mcg by mouth every morning. , Disp: , Rfl:  .  metoprolol tartrate (LOPRESSOR) 50 MG tablet, Take 1 tablet by mouth once for procedure., Disp: 1 tablet, Rfl: 0 .  pantoprazole (PROTONIX) 40 MG tablet,  TAKE 1 TABLET BY MOUTH ONCE DAILY, Disp: 90 tablet, Rfl: 3 .  phenazopyridine (PYRIDIUM) 200 MG tablet, Take 1 tablet (200 mg total) by mouth 3 (three) times daily as needed for pain., Disp: 30 tablet, Rfl: 0 .  SUMAtriptan (IMITREX) 100 MG tablet, TAKE 1 TABLET BY MOUTH AT ONSET OF MIGRAINE. MAY REPEAT DOSE IN 1 HOUR IF HEADACHE NOT RESOLVED, Disp: 9 tablet, Rfl: 0 .  zolpidem (AMBIEN) 5 MG tablet, TAKE 1  TABLET BY MOUTH AT BEDTIME AS NEEDED FOR SLEEP., Disp: 30 tablet, Rfl: 0  EXAM:  VITALS per patient if applicable:  GENERAL: alert, oriented, appears well and in no acute distress  HEENT: atraumatic, conjunttiva clear, no obvious abnormalities on inspection of external nose and ears  NECK: normal movements of the head and neck  LUNGS: on inspection no signs of respiratory distress, breathing rate appears normal, no obvious gross SOB, gasping or wheezing  CV: no obvious cyanosis  MS: moves all visible extremities without noticeable abnormality  PSYCH/NEURO: pleasant and cooperative, no obvious depression or anxiety, speech and thought processing grossly intact  ASSESSMENT AND PLAN:  Discussed the following assessment and plan:  COVID-19  -we discussed possible serious and likely etiologies, options for evaluation and workup, limitations of telemedicine visit vs in person visit, treatment, treatment risks and precautions. Pt prefers to treat via telemedicine empirically rather than in person at this moment. Discussed treatment options, potential complications, isolation and precautions. She opted against referral to treatment center for consideration of EUA treatments.  Opted for trial Tessalon foe cough, analgesic if needed, nasal saline and other home care measures per patient instructions. Advised follow up with PCP to check in given duration of symptoms. Also, some intermittent nature to her symptoms.  Work/School slipped offered: provided in patient instructions   Scheduled follow up with PCP offered:  Sent message to schedulers to assist and advised patient to contact PCP office to schedule if does not receive call back in next 24 hours. Advised to seek prompt in person care if worsening, new symptoms arise, or if is not improving with treatment. Discussed options for inperson care if PCP office not available. Did let this patient know that I only do telemedicine on Tuesdays and  Thursdays for [place]. Advised to schedule follow up visit with PCP or [facility] if any further questions or concerns to avoid delays in care.   I discussed the assessment and treatment plan with the patient. The patient was provided an opportunity to ask questions and all were answered. The patient agreed with the plan and demonstrated an understanding of the instructions.     Terressa Koyanagi, DO

## 2020-11-18 NOTE — Patient Instructions (Addendum)
   ---------------------------------------------------------------------------------------------------------------------------      WORK SLIP:  Patient Kim Daniel,  [birth year], was seen for a medical visit today, 11/18/20 . Please excuse from work for a COVID like illness. We advise 10 days minimum from the onset of symptoms (11/13/20) PLUS 1 day of no fever and improved symptoms. Will defer to employer for a sooner return to work if symptoms have resolved, it is greater than 5 days since the positive test and the patient can wear a high-quality, tight fitting mask such as N95 or KN95 at all times for an additional 5 days. Would also suggest COVID19 antigen testing is negative prior to return.  Sincerely: E-signature: Dr. Kriste Basque, DO [facility] Ph: [phone]   ------------------------------------------------------------------------------------------------------------------------------    HOME CARE TIPS:  [name] COVID19 testing information: [email or web address] [place] [phone] Most pharmacies also offer testing and home test kits.  -I sent the medication(s) we discussed to your pharmacy: Meds ordered this encounter  Medications  . benzonatate (TESSALON PERLES) 100 MG capsule    Sig: Take 1 capsule (100 mg total) by mouth 3 (three) times daily as needed.    Dispense:  20 capsule    Refill:  0     -can use tylenol or aleve if needed for fevers, aches and pains per instructions  -can use nasal saline a few times per day if you have nasal congestion  -stay hydrated, drink plenty of fluids and eat small healthy meals - avoid dairy  -can take 1000 IU ( ) Vit D3 and 100-500 mg of Vit C daily per instructions  -If the Covid test is positive, check out the CDC website for more information on home care, transmission and treatment for COVID19  -follow up with your doctor in 2-3 days unless improving and  feeling better  -stay home while sick, except to seek medical care, and if you have COVID19 ideally it would be best to stay home for a full 10 days since the onset of symptoms PLUS one day of no fever and feeling better. Wear a good mask (such as N95 or KN95) if around others to reduce the risk of transmission.  It was nice to meet you today, and I really hope you are feeling better soon. I help [facility] out with telemedicine visits on Tuesdays and Thursdays and am available for visits on those days. If you have any concerns or questions following this visit please schedule a follow up visit with your Primary Care doctor or seek care at a local urgent care clinic to avoid delays in care.    Seek in person care or schedule a follow up video visit promptly if your symptoms worsen, new concerns arise or you are not improving with treatment. Call 911 and/or seek emergency care if your symptoms are severe or life threatening.

## 2020-11-22 ENCOUNTER — Other Ambulatory Visit: Payer: Self-pay

## 2020-11-22 ENCOUNTER — Other Ambulatory Visit: Payer: Managed Care, Other (non HMO)

## 2020-11-22 ENCOUNTER — Telehealth (INDEPENDENT_AMBULATORY_CARE_PROVIDER_SITE_OTHER): Payer: Managed Care, Other (non HMO) | Admitting: Family Medicine

## 2020-11-22 DIAGNOSIS — Z20822 Contact with and (suspected) exposure to covid-19: Secondary | ICD-10-CM

## 2020-11-22 DIAGNOSIS — R159 Full incontinence of feces: Secondary | ICD-10-CM | POA: Insufficient documentation

## 2020-11-22 DIAGNOSIS — U071 COVID-19: Secondary | ICD-10-CM | POA: Diagnosis not present

## 2020-11-22 DIAGNOSIS — R194 Change in bowel habit: Secondary | ICD-10-CM | POA: Insufficient documentation

## 2020-11-22 DIAGNOSIS — R1031 Right lower quadrant pain: Secondary | ICD-10-CM | POA: Insufficient documentation

## 2020-11-22 DIAGNOSIS — R152 Fecal urgency: Secondary | ICD-10-CM | POA: Insufficient documentation

## 2020-11-22 DIAGNOSIS — R141 Gas pain: Secondary | ICD-10-CM | POA: Insufficient documentation

## 2020-11-22 DIAGNOSIS — K625 Hemorrhage of anus and rectum: Secondary | ICD-10-CM | POA: Insufficient documentation

## 2020-11-22 NOTE — Progress Notes (Signed)
Patient ID: Kim Daniel, female   DOB: 23-Sep-1946, 75 y.o.   MRN: [id number]  This visit type was conducted due to national recommendations for restrictions regarding the COVID-19 pandemic in an effort to limit this patient's exposure and mitigate transmission in our community.   Virtual Visit via Telephone Note  I connected with [name].  On 11/22/20 at  3:45 PM EST by telephone and verified that I am speaking with the correct person using two identifiers.   I discussed the limitations, risks, security and privacy concerns of performing an evaluation and management service by telephone and the availability of in person appointments. I also discussed with the patient that there may be a patient responsible charge related to this service. The patient expressed understanding and agreed to proceed.  Location patient: home Location provider: work or home office Participants present for the call: patient, provider Patient did not have a visit in the prior 7 days to address this/these issue(s).   History of Present Illness: Via relates onset of upper respiratory symptoms on 1-16/22.  She had been at the beach the weekend before with her daughter and 2 granddaughters although they had no symptoms.  She had positive Covid test on 11-15-20.  She states her cough is relatively mild she has no dyspnea.  Her main issue is fairly profound fatigue.  Denies any nausea, vomiting, or diarrhea.  She had virtual visit on 11-18-20.  She is taking nutritional supplement with zinc, vitamin D, vitamin C.  Other than the fatigue she feels that she is doing fairly well.  She apparently has scheduled test later today to repeat.  She states her place of employment is requiring a negative test before she can return.  She works part-time on Tuesday, Wednesday, and Thursday.  Past Medical History:  Diagnosis Date   Anxiety    Anxiety and depression    Chronic constipation    Depression    GERD  (gastroesophageal reflux disease)    History of hiatal hernia    History of migraine headaches    Seasonal allergic rhinitis    SUI (stress urinary incontinence, female)    Ureteral obstruction, right    Wears contact lenses    Past Surgical History:  Procedure Laterality Date   ANTERIOR CERVICAL DECOMP/DISCECTOMY FUSION  03-10-2004   C4 -- C6   BREAST BIOPSY Right 1980   BREAST EXCISIONAL BIOPSY Right 1980   COLONOSCOPY WITH PROPOFOL  03-08-2009   CYSTOSCOPY W/ URETERAL STENT PLACEMENT Right 10/02/2014   Procedure: CYSTOSCOPY WITH RIGHT RETROGRADE PYELOGRAM , URETERAL STENT PLACEMENT;  Surgeon: Garnett Farm, MD;  Location: [facility];  Service: Urology;  Laterality: Right;   ESOPHAGOGASTRODUODENOSCOPY  04-02-2002   TOTAL HIP ARTHROPLASTY  03/03/2012   Procedure: TOTAL HIP ARTHROPLASTY;  Surgeon: [name], MD;  Location: [facility];  Service: Orthopedics;  Laterality: Left;   TUBAL LIGATION  1971   VAGINAL HYSTERECTOMY  1976    reports that she quit smoking about 21 years ago. Her smoking use included cigarettes. She has a 10.00 pack-year smoking history. She has never used smokeless tobacco. She reports current alcohol use of about 1.0 standard drink of alcohol per week. She reports that she does not use drugs. family history includes Breast cancer in her maternal uncle; Cancer (age of onset: 71) in her father; Cirrhosis in her mother. Allergies  Allergen Reactions   Codeine Other (See Comments)   Hydrocodone Nausea And Vomiting and Other (See Comments)    "  get dizzy and spaced out" Other reaction(s): Other (See Comments) "get dizzy and spaced out"      Observations/Objective: Patient sounds cheerful and well on the phone. I do not appreciate any SOB. Speech and thought processing are grossly intact. Patient reported vitals:  Assessment and Plan:  COVID-19 infection.  Onset of symptoms 11-07-20.  Appears overall very stable from a  respiratory standpoint.  Her major issue is fatigue.  We explained that even if her test does come back negative today and she returns to work would consider on a part-time basis perhaps half days for the first week or 2 until her energy improves -Follow-up immediately for any fever, dyspnea, or other concerns  Follow Up Instructions:  -As above   99441 5-10 [id number] 11-20 [id number] 21-30 I did not refer this patient for an OV in the next 24 hours for this/these issue(s).  I discussed the assessment and treatment plan with the patient. The patient was provided an opportunity to ask questions and all were answered. The patient agreed with the plan and demonstrated an understanding of the instructions.   The patient was advised to call back or seek an in-person evaluation if the symptoms worsen or if the condition fails to improve as anticipated.  I provided 16 minutes of non-face-to-face time during this encounter.   Evelena Peat, MD

## 2020-11-24 LAB — NOVEL CORONAVIRUS, NAA: SARS-CoV-2, NAA: NOT DETECTED

## 2020-11-24 LAB — SARS-COV-2, NAA 2 DAY TAT

## 2020-12-15 ENCOUNTER — Other Ambulatory Visit: Payer: Self-pay | Admitting: Family Medicine

## 2020-12-15 NOTE — Addendum Note (Signed)
Addended by: Judd Gaudier on: 12/15/2020 01:20 PM   Modules accepted: Orders

## 2020-12-15 NOTE — Telephone Encounter (Signed)
Original script transmission failed.  Please resend.

## 2020-12-16 ENCOUNTER — Other Ambulatory Visit: Payer: Self-pay | Admitting: Family Medicine

## 2020-12-16 ENCOUNTER — Other Ambulatory Visit: Payer: Self-pay

## 2020-12-16 DIAGNOSIS — F5101 Primary insomnia: Secondary | ICD-10-CM

## 2020-12-16 MED ORDER — ZOLPIDEM TARTRATE 5 MG PO TABS: 5.0000 mg | ORAL_TABLET | Freq: Every evening | ORAL | 2 refills | Status: DC | PRN

## 2020-12-17 ENCOUNTER — Other Ambulatory Visit: Payer: Self-pay | Admitting: Family Medicine

## 2020-12-17 DIAGNOSIS — F5101 Primary insomnia: Secondary | ICD-10-CM

## 2020-12-17 MED ORDER — ZOLPIDEM TARTRATE 5 MG PO TABS: 5.0000 mg | ORAL_TABLET | Freq: Every evening | ORAL | 2 refills | Status: DC | PRN

## 2020-12-17 NOTE — Telephone Encounter (Signed)
Pharmacy is calling in stating that they would like to see if we can resend the Rx zolpidem (AMBIEN) 5 MG due to them not receiving it the other day b/c they were updating their system and the control substance medication were not received once the system came back on line.  They are needing it so that they can deliver the medication to the pt since she is out.  If you should have any questions you can give them a call.

## 2020-12-24 ENCOUNTER — Ambulatory Visit: Payer: Managed Care, Other (non HMO) | Admitting: Physical Therapy

## 2021-02-23 DIAGNOSIS — M545 Low back pain, unspecified: Secondary | ICD-10-CM | POA: Diagnosis not present

## 2021-02-23 DIAGNOSIS — M810 Age-related osteoporosis without current pathological fracture: Secondary | ICD-10-CM | POA: Diagnosis not present

## 2021-02-23 DIAGNOSIS — M25551 Pain in right hip: Secondary | ICD-10-CM | POA: Diagnosis not present

## 2021-02-23 DIAGNOSIS — M542 Cervicalgia: Secondary | ICD-10-CM | POA: Diagnosis not present

## 2021-03-01 DIAGNOSIS — M79651 Pain in right thigh: Secondary | ICD-10-CM | POA: Diagnosis not present

## 2021-03-01 DIAGNOSIS — M25551 Pain in right hip: Secondary | ICD-10-CM | POA: Diagnosis not present

## 2021-03-08 DIAGNOSIS — M25551 Pain in right hip: Secondary | ICD-10-CM | POA: Diagnosis not present

## 2021-03-08 DIAGNOSIS — M545 Low back pain, unspecified: Secondary | ICD-10-CM | POA: Diagnosis not present

## 2021-03-16 DIAGNOSIS — M810 Age-related osteoporosis without current pathological fracture: Secondary | ICD-10-CM | POA: Diagnosis not present

## 2021-03-16 DIAGNOSIS — M546 Pain in thoracic spine: Secondary | ICD-10-CM | POA: Diagnosis not present

## 2021-03-16 DIAGNOSIS — E559 Vitamin D deficiency, unspecified: Secondary | ICD-10-CM | POA: Diagnosis not present

## 2021-03-16 DIAGNOSIS — M542 Cervicalgia: Secondary | ICD-10-CM | POA: Diagnosis not present

## 2021-03-16 DIAGNOSIS — R5383 Other fatigue: Secondary | ICD-10-CM | POA: Diagnosis not present

## 2021-03-16 DIAGNOSIS — M545 Low back pain, unspecified: Secondary | ICD-10-CM | POA: Diagnosis not present

## 2021-03-23 DIAGNOSIS — M545 Low back pain, unspecified: Secondary | ICD-10-CM | POA: Diagnosis not present

## 2021-03-29 DIAGNOSIS — M7062 Trochanteric bursitis, left hip: Secondary | ICD-10-CM | POA: Diagnosis not present

## 2021-04-05 ENCOUNTER — Ambulatory Visit (INDEPENDENT_AMBULATORY_CARE_PROVIDER_SITE_OTHER): Payer: Medicare Other | Admitting: Family Medicine

## 2021-04-05 ENCOUNTER — Encounter: Payer: Self-pay | Admitting: Family Medicine

## 2021-04-05 ENCOUNTER — Other Ambulatory Visit: Payer: Self-pay

## 2021-04-05 VITALS — BP 130/62 | HR 76 | Temp 98.1°F | Ht 64.0 in | Wt 130.6 lb

## 2021-04-05 DIAGNOSIS — Z1211 Encounter for screening for malignant neoplasm of colon: Secondary | ICD-10-CM | POA: Diagnosis not present

## 2021-04-05 DIAGNOSIS — Z0000 Encounter for general adult medical examination without abnormal findings: Secondary | ICD-10-CM | POA: Diagnosis not present

## 2021-04-05 NOTE — Progress Notes (Signed)
Established Patient Office Visit  Subjective:  Patient ID: Kim Daniel, female    DOB: 01-12-1946  Age: 75 y.o. MRN: [id number]  CC:  Chief Complaint  Patient presents with   Annual Exam    No new concerns     HPI Kim Daniel presents for physical exam   she is followed by sports medicine for her osteoporosis and she recently had some lab work which she brings in a copy of which was concerning for possible rheumatoid arthritis.  She has pending follow-up with rheumatologist.  She had CCP antibody that came back very high at 130.  She also had ANA titer of 1/80 and sed rate of 43.  Her other medical problems include history of osteoporosis, history of migraine headaches, IBS, chronic insomnia.  She still sees gynecologist regularly.  Maintenance reviewed:  -Receives yearly flu vaccines -Overdue for tetanus but declines at this time -Declines hepatitis C antibody -Overdue for colonoscopy.  She declines colonoscopy but does agree to Cologuard after discussing specificity and sensitivity of the test -Has had previous Zostavax but not Shingrix  Surgical history-previous anterior cervical discectomy/fusion, history of breast excisional biopsy, left total hip arthroplasty, bilateral tubal ligation, history of vaginal hysterectomy  Family history-Father died of lung cancer complications.  Mother died of nonalcoholic cirrhosis.  Exact etiology unclear She has 2 sisters who are alive and generally doing well.  Social history-she is married.  Quit smoking in 2000.  2 children.  She has several grandchildren.  Usually 1 alcoholic drink per week.  Past Medical History:  Diagnosis Date   Anxiety    Anxiety and depression    Chronic constipation    Depression    GERD (gastroesophageal reflux disease)    History of hiatal hernia    History of migraine headaches    Seasonal allergic rhinitis    SUI (stress urinary incontinence, female)    Ureteral obstruction, right    Wears  contact lenses     Past Surgical History:  Procedure Laterality Date   ANTERIOR CERVICAL DECOMP/DISCECTOMY FUSION  03-10-2004   C4 -- C6   BREAST BIOPSY Right 1980   BREAST EXCISIONAL BIOPSY Right 1980   COLONOSCOPY WITH PROPOFOL  03-08-2009   CYSTOSCOPY W/ URETERAL STENT PLACEMENT Right 10/02/2014   Procedure: CYSTOSCOPY WITH RIGHT RETROGRADE PYELOGRAM , URETERAL STENT PLACEMENT;  Surgeon: Garnett Farm, MD;  Location: [facility] [place];  Service: Urology;  Laterality: Right;   ESOPHAGOGASTRODUODENOSCOPY  04-02-2002   TOTAL HIP ARTHROPLASTY  03/03/2012   Procedure: TOTAL HIP ARTHROPLASTY;  Surgeon: [name], MD;  Location: [facility];  Service: Orthopedics;  Laterality: Left;   TUBAL LIGATION  1971   VAGINAL HYSTERECTOMY  1976    Family History  Problem Relation Age of Onset   Cirrhosis Mother        non alcohol cirrhosis   Cancer Father 74       lung cancer   Breast cancer Maternal Uncle     Social History   Socioeconomic History   Marital status: Married    Spouse name: Not on file   Number of children: 2   Years of education: Not on file   Highest education level: Not on file  Occupational History   Occupation: human resources  Tobacco Use   Smoking status: Former    Packs/day: 0.50    Years: 20.00    Pack years: 10.00    Types: Cigarettes    Quit date: 10/02/1999  Years since quitting: 21.5   Smokeless tobacco: Never  Vaping Use   Vaping Use: Never used  Substance and Sexual Activity   Alcohol use: Yes    Alcohol/week: 1.0 standard drink    Types: 1 Glasses of wine per week    Comment: occaional   Drug use: No   Sexual activity: Not on file  Other Topics Concern   Not on file  Social History Narrative   Not on file   Social Determinants of Health   Financial Resource Strain: Not on file  Food Insecurity: Not on file  Transportation Needs: Not on file  Physical Activity: Not on file  Stress: Not on file  Social Connections: Not on  file  Intimate Partner Violence: Not on file    Outpatient Medications Prior to Visit  Medication Sig Dispense Refill   benzonatate (TESSALON PERLES) 100 MG capsule Take 1 capsule (100 mg total) by mouth 3 (three) times daily as needed. 20 capsule 0   buPROPion (WELLBUTRIN XL) 150 MG 24 hr tablet TAKE 1 TABLET BY MOUTH ONCE DAILY 90 tablet 1   Calcium Carbonate-Vitamin D (CALTRATE 600+D PO) Take 1 tablet by mouth 3 (three) times a week.     citalopram (CELEXA) 40 MG tablet TAKE 1 TABLET BY MOUTH ONCE DAILY 90 tablet 0   clonazePAM (KLONOPIN) 0.5 MG tablet TAKE 1 TABLET BY MOUTH EVERY DAY AS NEEDED     clotrimazole-betamethasone (LOTRISONE) cream Apply one application to skin twice per day as needed for skin irritation. 30 g 1   fluticasone (FLONASE) 50 MCG/ACT nasal spray PLACE 2 SPRAYS INTO BOTH NOSTRILS DAILY 48 g 3   LINZESS 290 MCG CAPS capsule Take 290 mcg by mouth every morning.      metoprolol tartrate (LOPRESSOR) 50 MG tablet Take 1 tablet by mouth once for procedure. 1 tablet 0   pantoprazole (PROTONIX) 40 MG tablet TAKE 1 TABLET BY MOUTH ONCE DAILY 90 tablet 3   phenazopyridine (PYRIDIUM) 200 MG tablet Take 1 tablet (200 mg total) by mouth 3 (three) times daily as needed for pain. 30 tablet 0   SUMAtriptan (IMITREX) 100 MG tablet TAKE 1 TABLET BY MOUTH AT ONSET OF MIGRAINE. MAY REPEAT DOSE IN 1 HOUR IF HEADACHE NOT RESOLVED 9 tablet 0   zolpidem (AMBIEN) 5 MG tablet Take 1 tablet (5 mg total) by mouth at bedtime as needed. for sleep 30 tablet 2   No facility-administered medications prior to visit.    Allergies  Allergen Reactions   Codeine Other (See Comments)   Hydrocodone Nausea And Vomiting and Other (See Comments)    "get dizzy and spaced out" Other reaction(s): Other (See Comments) "get dizzy and spaced out"    ROS Review of Systems  Constitutional:  Negative for activity change, appetite change, fatigue, fever and unexpected weight change.  HENT:  Negative for ear  pain, hearing loss, sore throat and trouble swallowing.   Eyes:  Negative for visual disturbance.  Respiratory:  Negative for cough and shortness of breath.   Cardiovascular:  Negative for chest pain and palpitations.  Gastrointestinal:  Negative for abdominal pain, blood in stool, constipation and diarrhea.  Genitourinary:  Negative for dysuria and hematuria.  Musculoskeletal:  Positive for arthralgias. Negative for back pain and myalgias.  Skin:  Negative for rash.  Neurological:  Negative for dizziness, syncope and headaches.  Hematological:  Negative for adenopathy.  Psychiatric/Behavioral:  Negative for confusion and dysphoric mood.      Objective:  Physical Exam Constitutional:      Appearance: She is well-developed.  HENT:     Head: Normocephalic and atraumatic.  Eyes:     Pupils: Pupils are equal, round, and reactive to light.  Neck:     Thyroid: No thyromegaly.  Cardiovascular:     Rate and Rhythm: Normal rate and regular rhythm.     Heart sounds: Normal heart sounds. No murmur heard. Pulmonary:     Effort: No respiratory distress.     Breath sounds: Normal breath sounds. No wheezing or rales.  Abdominal:     General: Bowel sounds are normal. There is no distension.     Palpations: Abdomen is soft. There is no mass.     Tenderness: There is no abdominal tenderness. There is no guarding or rebound.  Musculoskeletal:        General: Normal range of motion.     Cervical back: Normal range of motion and neck supple.     Right lower leg: No edema.     Left lower leg: No edema.  Lymphadenopathy:     Cervical: No cervical adenopathy.  Skin:    Findings: No rash.  Neurological:     Mental Status: She is alert and oriented to person, place, and time.     Cranial Nerves: No cranial nerve deficit.     Deep Tendon Reflexes: Reflexes normal.  Psychiatric:        Behavior: Behavior normal.        Thought Content: Thought content normal.        Judgment: Judgment normal.     BP 130/62 (BP Location: Left Arm, Patient Position: Sitting, Cuff Size: Normal)   Pulse 76   Temp 98.1 F (36.7 C) (Oral)   Ht 5\' 4"  (1.626 m)   Wt 130 lb 9.6 oz (59.2 kg)   SpO2 96%   BMI 22.42 kg/m  Wt Readings from Last 3 Encounters:  04/05/21 130 lb 9.6 oz (59.2 kg)  12/05/19 136 lb (61.7 kg)  12/03/19 132 lb 12.8 oz (60.2 kg)     Health Maintenance Due  Topic Date Due   Zoster Vaccines- Shingrix (1 of 2) Never done   COVID-19 Vaccine (4 - Booster for Moderna series) 02/07/2021    There are no preventive care reminders to display for this patient.  Lab Results  Component Value Date   TSH 1.39 05/17/2018   Lab Results  Component Value Date   WBC 6.3 12/03/2019   HGB 13.7 12/03/2019   HCT 41.2 12/03/2019   MCV 87.5 12/03/2019   PLT 240.0 12/03/2019   Lab Results  Component Value Date   NA 138 12/03/2019   K 4.3 12/03/2019   CO2 29 12/03/2019   GLUCOSE 75 12/03/2019   BUN 11 12/03/2019   CREATININE 0.84 12/03/2019   BILITOT 0.7 12/03/2019   ALKPHOS 38 (L) 12/03/2019   AST 21 12/03/2019   ALT 9 12/03/2019   PROT 6.8 12/03/2019   ALBUMIN 4.5 12/03/2019   CALCIUM 9.4 12/03/2019   ANIONGAP 10 05/26/2018   GFR 66.24 12/03/2019   Lab Results  Component Value Date   CHOL 226 (H) 05/17/2018   Lab Results  Component Value Date   HDL 79.20 05/17/2018   Lab Results  Component Value Date   LDLCALC 132 (H) 05/17/2018   Lab Results  Component Value Date   TRIG 74.0 05/17/2018   Lab Results  Component Value Date   CHOLHDL 3 05/17/2018   No results found  for: HGBA1C    Assessment & Plan:   Problem List Items Addressed This Visit   None Visit Diagnoses     Physical exam    -  Primary   Screening for colon cancer       Relevant Orders   Cologuard     Patient agrees to Cologuard after discussing specificity and sensitivity of the test.  Recommend she consider Shingrix vaccine.  She will check on coverage  We elected not to do any labs  today as she had multiple labs done recently through her orthopedist.  She plans to continue GYN follow-up for her mammograms  Consider hepatitis C antibody with future blood draw  She is getting DEXA scans through orthopedics  No orders of the defined types were placed in this encounter.   Follow-up: No follow-ups on file.    Evelena Peat, MD

## 2021-04-05 NOTE — Patient Instructions (Signed)
Consider Shingrix vaccine and will want to check with insurance first for coverage.  We have ordered the Cologuard and you should receive in 1-2 weeks.

## 2021-04-21 ENCOUNTER — Encounter: Payer: Self-pay | Admitting: Family Medicine

## 2021-04-21 DIAGNOSIS — Z1211 Encounter for screening for malignant neoplasm of colon: Secondary | ICD-10-CM | POA: Diagnosis not present

## 2021-04-22 LAB — COLOGUARD: Cologuard: NEGATIVE

## 2021-04-26 ENCOUNTER — Other Ambulatory Visit: Payer: Self-pay | Admitting: Family Medicine

## 2021-04-26 DIAGNOSIS — F5101 Primary insomnia: Secondary | ICD-10-CM

## 2021-04-26 NOTE — Telephone Encounter (Signed)
zolpidem (AMBIEN) 5 MG tablet  Last refill 12/17/20 Last office visit 04/05/21

## 2021-04-27 LAB — COLOGUARD: Cologuard: NEGATIVE

## 2021-04-28 LAB — COLOGUARD: COLOGUARD: NEGATIVE

## 2021-04-29 ENCOUNTER — Encounter: Payer: Self-pay | Admitting: Family Medicine

## 2021-05-03 DIAGNOSIS — R768 Other specified abnormal immunological findings in serum: Secondary | ICD-10-CM | POA: Diagnosis not present

## 2021-05-06 ENCOUNTER — Telehealth: Payer: Self-pay | Admitting: Family Medicine

## 2021-05-06 NOTE — Telephone Encounter (Signed)
Called spoke with patient about results.

## 2021-05-06 NOTE — Telephone Encounter (Signed)
Patient called for her cologuard results.

## 2021-05-11 ENCOUNTER — Other Ambulatory Visit: Payer: Self-pay | Admitting: Family Medicine

## 2021-06-20 DIAGNOSIS — E559 Vitamin D deficiency, unspecified: Secondary | ICD-10-CM | POA: Diagnosis not present

## 2021-06-20 DIAGNOSIS — M810 Age-related osteoporosis without current pathological fracture: Secondary | ICD-10-CM | POA: Diagnosis not present

## 2021-07-05 ENCOUNTER — Telehealth: Payer: Self-pay | Admitting: *Deleted

## 2021-07-05 NOTE — Telephone Encounter (Signed)
Medicare AWV scheduled for 09/23 at 5:30om

## 2021-07-11 ENCOUNTER — Telehealth: Payer: Self-pay | Admitting: *Deleted

## 2021-07-11 NOTE — Telephone Encounter (Signed)
Called pt to advise since she hasnt had a medicare wellness visit she would need to be seen in office for her initial one. Voicemail box is full needs to be called back

## 2021-07-12 NOTE — Telephone Encounter (Signed)
Pt changed to in person

## 2021-07-15 ENCOUNTER — Ambulatory Visit: Payer: Medicare Other

## 2021-07-20 DIAGNOSIS — R768 Other specified abnormal immunological findings in serum: Secondary | ICD-10-CM | POA: Diagnosis not present

## 2021-07-20 DIAGNOSIS — M25512 Pain in left shoulder: Secondary | ICD-10-CM | POA: Diagnosis not present

## 2021-07-20 DIAGNOSIS — M25511 Pain in right shoulder: Secondary | ICD-10-CM | POA: Diagnosis not present

## 2021-07-25 ENCOUNTER — Ambulatory Visit: Payer: Medicare Other

## 2021-07-27 ENCOUNTER — Ambulatory Visit (INDEPENDENT_AMBULATORY_CARE_PROVIDER_SITE_OTHER): Payer: Medicare Other | Admitting: Family Medicine

## 2021-07-27 ENCOUNTER — Other Ambulatory Visit: Payer: Self-pay

## 2021-07-27 VITALS — BP 130/80 | HR 85 | Temp 98.5°F | Wt 129.4 lb

## 2021-07-27 DIAGNOSIS — R413 Other amnesia: Secondary | ICD-10-CM | POA: Diagnosis not present

## 2021-07-27 DIAGNOSIS — R251 Tremor, unspecified: Secondary | ICD-10-CM | POA: Diagnosis not present

## 2021-07-27 LAB — TSH: TSH: 2.32 u[IU]/mL (ref 0.35–5.50)

## 2021-07-27 LAB — VITAMIN B12: Vitamin B-12: 201 pg/mL — ABNORMAL LOW (ref 211–911)

## 2021-07-27 NOTE — Progress Notes (Signed)
Established Patient Office Visit  Subjective:  Patient ID: Kim Daniel, female    DOB: 1946/09/19  Age: 75 y.o. MRN: [id number]  CC:  Chief Complaint  Patient presents with   Memory Loss    Has progressed very quickly in the past 2 months, loosing train of though very easy. Unable to finish her sentences because she can not remember what she was going to say. 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  She states that she is frequently losing her train of thought in the middle of her sentences and frequently cannot think of the word to say and multiple times has forgotten what she was talking about in mid sentence.  She also relates episode recently where she was driving to see her rheumatologist whom she had seen previously and could not remember how to get there nor could she remember the name of the rheumatologist.   Around the same time she also noted onset of right upper extremity tremor.  Denies prior history of tremor.  No recent head injury.  No fever.  She was recently diagnosed with polymyalgia rheumatica and started on low-dose prednisone but her concerns for mental status changes preceded that.  Denies any recent slurred speech.  Not aware of any focal upper or lower extremity weakness.  Had recent chemistries and CBC through sports medicine.  No recent thyroid studies or B12. Occasional left-sided parietal headaches.  Not consistent.  No known family history of dementia.  Past Medical History:  Diagnosis Date   Anxiety    Anxiety and depression    Chronic constipation    Depression    GERD (gastroesophageal reflux disease)    History of hiatal hernia    History of migraine headaches    Seasonal allergic rhinitis    SUI (stress urinary incontinence, female)    Ureteral obstruction, right    Wears contact lenses     Past Surgical History:  Procedure  Laterality Date   ANTERIOR CERVICAL DECOMP/DISCECTOMY FUSION  03-10-2004   C4 -- C6   BREAST BIOPSY Right 1980   BREAST EXCISIONAL BIOPSY Right 1980   COLONOSCOPY WITH PROPOFOL  03-08-2009   CYSTOSCOPY W/ URETERAL STENT PLACEMENT Right 10/02/2014   Procedure: CYSTOSCOPY WITH RIGHT RETROGRADE PYELOGRAM , URETERAL STENT PLACEMENT;  Surgeon: Garnett Farm, MD;  Location: [facility] [place];  Service: Urology;  Laterality: Right;   ESOPHAGOGASTRODUODENOSCOPY  04-02-2002   TOTAL HIP ARTHROPLASTY  03/03/2012   Procedure: TOTAL HIP ARTHROPLASTY;  Surgeon: [name], MD;  Location: [facility];  Service: Orthopedics;  Laterality: Left;   TUBAL LIGATION  1971   VAGINAL HYSTERECTOMY  1976    Family History  Problem Relation Age of Onset   Cirrhosis Mother        non alcohol cirrhosis   Cancer Father 57       lung cancer   Breast cancer Maternal Uncle     Social History   Socioeconomic History   Marital status: Married    Spouse name: Not on file   Number of children: 2   Years of education: Not on file   Highest education level: Not on file  Occupational History   Occupation: human resources  Tobacco Use   Smoking status: Former    Packs/day: 0.50    Years: 20.00    Pack years: 10.00  Types: Cigarettes    Quit date: 10/02/1999    Years since quitting: 21.8   Smokeless tobacco: Never  Vaping Use   Vaping Use: Never used  Substance and Sexual Activity   Alcohol use: Yes    Alcohol/week: 1.0 standard drink    Types: 1 Glasses of wine per week    Comment: occaional   Drug use: No   Sexual activity: Not on file  Other Topics Concern   Not on file  Social History Narrative   Not on file   Social Determinants of Health   Financial Resource Strain: Not on file  Food Insecurity: Not on file  Transportation Needs: Not on file  Physical Activity: Not on file  Stress: Not on file  Social Connections: Not on file  Intimate Partner Violence: Not on file     Outpatient Medications Prior to Visit  Medication Sig Dispense Refill   benzonatate (TESSALON PERLES) 100 MG capsule Take 1 capsule (100 mg total) by mouth 3 (three) times daily as needed. 20 capsule 0   buPROPion (WELLBUTRIN XL) 150 MG 24 hr tablet TAKE 1 TABLET BY MOUTH ONCE DAILY 90 tablet 1   Calcium Carbonate-Vitamin D (CALTRATE 600+D PO) Take 1 tablet by mouth 3 (three) times a week.     citalopram (CELEXA) 40 MG tablet TAKE 1 TABLET BY MOUTH ONCE DAILY 90 tablet 1   clonazePAM (KLONOPIN) 0.5 MG tablet TAKE 1 TABLET BY MOUTH EVERY DAY AS NEEDED     clotrimazole-betamethasone (LOTRISONE) cream Apply one application to skin twice per day as needed for skin irritation. 30 g 1   fluticasone (FLONASE) 50 MCG/ACT nasal spray PLACE 2 SPRAYS INTO BOTH NOSTRILS DAILY 48 g 3   LINZESS 290 MCG CAPS capsule Take 290 mcg by mouth every morning.      metoprolol tartrate (LOPRESSOR) 50 MG tablet Take 1 tablet by mouth once for procedure. 1 tablet 0   pantoprazole (PROTONIX) 40 MG tablet TAKE 1 TABLET BY MOUTH ONCE DAILY 90 tablet 3   phenazopyridine (PYRIDIUM) 200 MG tablet Take 1 tablet (200 mg total) by mouth 3 (three) times daily as needed for pain. 30 tablet 0   SUMAtriptan (IMITREX) 100 MG tablet TAKE 1 TABLET BY MOUTH AT ONSET OF MIGRAINE. MAY REPEAT DOSE IN 1 HOUR IF HEADACHE NOT RESOLVED 9 tablet 0   zolpidem (AMBIEN) 5 MG tablet TAKE 1 TABLET BY MOUTH AT BEDTIME AS NEEDED FOR SLEEP. 90 tablet 0   No facility-administered medications prior to visit.    Allergies  Allergen Reactions   Codeine Other (See Comments)   Hydrocodone Nausea And Vomiting and Other (See Comments)    "get dizzy and spaced out" Other reaction(s): Other (See Comments) "get dizzy and spaced out"    ROS Review of Systems  Constitutional:  Negative for appetite change, chills and fever.  Respiratory:  Negative for cough.   Cardiovascular:  Negative for chest pain.  Gastrointestinal:  Negative for abdominal  pain.  Musculoskeletal:  Positive for arthralgias.  Neurological:  Positive for tremors. Negative for dizziness, seizures, syncope, speech difficulty, weakness and light-headedness.  Psychiatric/Behavioral:  Negative for dysphoric mood and suicidal ideas.      Objective:    Physical Exam Vitals reviewed.  Constitutional:      Appearance: Normal appearance.  Cardiovascular:     Rate and Rhythm: Normal rate and regular rhythm.  Pulmonary:     Effort: Pulmonary effort is normal.     Breath sounds: Normal breath sounds.  Musculoskeletal:     Cervical back: Neck supple.     Right lower leg: No edema.     Left lower leg: No edema.  Lymphadenopathy:     Cervical: No cervical adenopathy.  Neurological:     General: No focal deficit present.     Mental Status: She is alert.     Comments: She has some obvious tremor right upper extremity.  Minimal tremor left upper extremity.  No focal weakness.  Cranial nerves II through XII intact.  Cerebellar-able to perform finger-to-nose but very slowly and with some difficulty.  Gait normal.  Psychiatric:     Comments: Oriented to day of week, year, month.  She missed date by 1 day.  2 out of 3 short-term recall.    BP 130/80 (BP Location: Left Arm, Patient Position: Sitting, Cuff Size: Normal)   Pulse 85   Temp 98.5 F (36.9 C) (Oral)   Wt 129 lb 6.4 oz (58.7 kg)   SpO2 98%   BMI 22.21 kg/m  Wt Readings from Last 3 Encounters:  07/27/21 129 lb 6.4 oz (58.7 kg)  04/05/21 130 lb 9.6 oz (59.2 kg)  12/05/19 136 lb (61.7 kg)     Health Maintenance Due  Topic Date Due   Zoster Vaccines- Shingrix (1 of 2) Never done   COVID-19 Vaccine (4 - Booster for Moderna series) 02/07/2021   INFLUENZA VACCINE  05/23/2021    There are no preventive care reminders to display for this patient.  Lab Results  Component Value Date   TSH 1.39 05/17/2018   Lab Results  Component Value Date   WBC 6.3 12/03/2019   HGB 13.7 12/03/2019   HCT 41.2  12/03/2019   MCV 87.5 12/03/2019   PLT 240.0 12/03/2019   Lab Results  Component Value Date   NA 138 12/03/2019   K 4.3 12/03/2019   CO2 29 12/03/2019   GLUCOSE 75 12/03/2019   BUN 11 12/03/2019   CREATININE 0.84 12/03/2019   BILITOT 0.7 12/03/2019   ALKPHOS 38 (L) 12/03/2019   AST 21 12/03/2019   ALT 9 12/03/2019   PROT 6.8 12/03/2019   ALBUMIN 4.5 12/03/2019   CALCIUM 9.4 12/03/2019   ANIONGAP 10 05/26/2018   GFR 66.24 12/03/2019   Lab Results  Component Value Date   CHOL 226 (H) 05/17/2018   Lab Results  Component Value Date   HDL 79.20 05/17/2018   Lab Results  Component Value Date   LDLCALC 132 (H) 05/17/2018   Lab Results  Component Value Date   TRIG 74.0 05/17/2018   Lab Results  Component Value Date   CHOLHDL 3 05/17/2018   No results found for: HGBA1C    Assessment & Plan:   Problem List Items Addressed This Visit   None Visit Diagnoses     Memory loss    -  Primary   Relevant Orders   TSH   Vitamin B12   MR Brain Wo Contrast   Tremor of right hand       Relevant Orders   MR Brain Wo Contrast     Patient presents with reported 26-month history of fairly abrupt onset of cognitive changes with memory deficits and difficulty word finding.  She noticed some pronounced tremor right upper extremity starting about the same time.  Needs further evaluation.  We recommended the following  -Check TSH and B12 -Set up MRI brain to further assess -Depending on results above consider referral to neurology and/or neuropsychologist.  No orders  of the defined types were placed in this encounter.   Follow-up: No follow-ups on file.    Carolann Littler, MD

## 2021-07-27 NOTE — Patient Instructions (Signed)
We will get labs today and setting up MRI brain.

## 2021-08-03 ENCOUNTER — Other Ambulatory Visit: Payer: Self-pay

## 2021-08-03 ENCOUNTER — Ambulatory Visit (INDEPENDENT_AMBULATORY_CARE_PROVIDER_SITE_OTHER): Payer: Medicare Other

## 2021-08-03 DIAGNOSIS — E538 Deficiency of other specified B group vitamins: Secondary | ICD-10-CM

## 2021-08-03 DIAGNOSIS — Z23 Encounter for immunization: Secondary | ICD-10-CM | POA: Diagnosis not present

## 2021-08-03 MED ORDER — CYANOCOBALAMIN 1000 MCG/ML IJ SOLN
1000.0000 ug | Freq: Once | INTRAMUSCULAR | Status: AC
  Administered 2021-08-03: 1000 ug via INTRAMUSCULAR

## 2021-08-03 NOTE — Progress Notes (Signed)
Per orders of Dr. Burchette, injection of Cyanocobalamin 1000 mcg given by Gilda Abboud L Eulas Schweitzer. Patient tolerated injection well.  

## 2021-08-10 ENCOUNTER — Ambulatory Visit (INDEPENDENT_AMBULATORY_CARE_PROVIDER_SITE_OTHER): Payer: Medicare Other

## 2021-08-10 ENCOUNTER — Other Ambulatory Visit: Payer: Self-pay

## 2021-08-10 DIAGNOSIS — E538 Deficiency of other specified B group vitamins: Secondary | ICD-10-CM | POA: Diagnosis not present

## 2021-08-10 MED ORDER — CYANOCOBALAMIN 1000 MCG/ML IJ SOLN
1000.0000 ug | INTRAMUSCULAR | Status: AC
  Administered 2021-08-10: 1000 ug via INTRAMUSCULAR

## 2021-08-10 NOTE — Progress Notes (Signed)
Pt here for weekly B12 injection #2 of 4 per Dr Caryl Never.  B12 given IM right deltoid and pt tolerated injection well.  Next B12 injection scheduled for 08/17/21.

## 2021-08-17 ENCOUNTER — Other Ambulatory Visit: Payer: Self-pay | Admitting: Family Medicine

## 2021-08-17 ENCOUNTER — Ambulatory Visit: Payer: Medicare Other

## 2021-08-17 DIAGNOSIS — F5101 Primary insomnia: Secondary | ICD-10-CM

## 2021-08-17 NOTE — Telephone Encounter (Signed)
Last filled 04/26/2021 Last OV 07/27/2021  Ok to fill?

## 2021-08-18 ENCOUNTER — Ambulatory Visit
Admission: RE | Admit: 2021-08-18 | Discharge: 2021-08-18 | Disposition: A | Discharge status: Home / Self Care | Payer: Medicare Other | Source: Ambulatory Visit | Attending: Family Medicine | Admitting: Family Medicine

## 2021-08-18 ENCOUNTER — Other Ambulatory Visit: Payer: Self-pay

## 2021-08-18 ENCOUNTER — Ambulatory Visit (INDEPENDENT_AMBULATORY_CARE_PROVIDER_SITE_OTHER): Payer: Medicare Other

## 2021-08-18 DIAGNOSIS — R413 Other amnesia: Secondary | ICD-10-CM

## 2021-08-18 DIAGNOSIS — R251 Tremor, unspecified: Secondary | ICD-10-CM

## 2021-08-18 DIAGNOSIS — I6782 Cerebral ischemia: Secondary | ICD-10-CM | POA: Diagnosis not present

## 2021-08-18 DIAGNOSIS — E538 Deficiency of other specified B group vitamins: Secondary | ICD-10-CM | POA: Diagnosis not present

## 2021-08-18 DIAGNOSIS — I611 Nontraumatic intracerebral hemorrhage in hemisphere, cortical: Secondary | ICD-10-CM | POA: Diagnosis not present

## 2021-08-18 MED ORDER — CYANOCOBALAMIN 1000 MCG/ML IJ SOLN
1000.0000 ug | Freq: Once | INTRAMUSCULAR | Status: AC
  Administered 2021-08-18: 1000 ug via INTRAMUSCULAR

## 2021-08-18 NOTE — Progress Notes (Signed)
Per orders of Cory Nafziger NP, injection of Cyanocobalamin 1000 mcg given by Sharonlee Nine L Lorcan Shelp. Patient tolerated injection well.  

## 2021-08-19 ENCOUNTER — Telehealth: Payer: Self-pay | Admitting: Family Medicine

## 2021-08-19 NOTE — Telephone Encounter (Signed)
Dr. Caryl Never has not received and reviewed these results yet. It usually take a few days for [place] to get these back. Will call the patient as soon as Dr. Caryl Never has had a chance to review this.

## 2021-08-19 NOTE — Telephone Encounter (Signed)
Pt is calling and would like result of mri of brain. Pt had mri yesterday

## 2021-08-19 NOTE — Telephone Encounter (Signed)
ATC, unable to leave a voice mail.  

## 2021-08-22 ENCOUNTER — Telehealth: Payer: Self-pay

## 2021-08-22 ENCOUNTER — Encounter: Payer: Self-pay | Admitting: Psychology

## 2021-08-22 ENCOUNTER — Other Ambulatory Visit: Payer: Self-pay

## 2021-08-22 DIAGNOSIS — R413 Other amnesia: Secondary | ICD-10-CM

## 2021-08-22 NOTE — Telephone Encounter (Signed)
Dr. Caryl Never has not had a chance to review these yet. The results were just released to [place] today. Spoke with the patient. She is aware I will call once Dr. Caryl Never has reviewed this.

## 2021-08-22 NOTE — Telephone Encounter (Signed)
Patient called requesting a call back to discuss MRI results.  

## 2021-08-24 ENCOUNTER — Other Ambulatory Visit: Payer: Self-pay

## 2021-08-24 ENCOUNTER — Ambulatory Visit: Payer: Medicare Other | Admitting: *Deleted

## 2021-08-30 DIAGNOSIS — Z1231 Encounter for screening mammogram for malignant neoplasm of breast: Secondary | ICD-10-CM | POA: Diagnosis not present

## 2021-09-08 DIAGNOSIS — Z6823 Body mass index (BMI) 23.0-23.9, adult: Secondary | ICD-10-CM | POA: Diagnosis not present

## 2021-09-08 DIAGNOSIS — M7062 Trochanteric bursitis, left hip: Secondary | ICD-10-CM | POA: Diagnosis not present

## 2021-11-15 ENCOUNTER — Other Ambulatory Visit: Payer: Self-pay | Admitting: Family Medicine

## 2021-11-15 DIAGNOSIS — F5101 Primary insomnia: Secondary | ICD-10-CM

## 2021-11-15 NOTE — Telephone Encounter (Signed)
Ambien last filled 08/17/2021 Last OV 07/27/2021  Ok to fill?

## 2021-12-07 DIAGNOSIS — M25512 Pain in left shoulder: Secondary | ICD-10-CM | POA: Diagnosis not present

## 2021-12-07 DIAGNOSIS — M25511 Pain in right shoulder: Secondary | ICD-10-CM | POA: Diagnosis not present

## 2021-12-07 DIAGNOSIS — R768 Other specified abnormal immunological findings in serum: Secondary | ICD-10-CM | POA: Diagnosis not present

## 2021-12-13 ENCOUNTER — Ambulatory Visit: Payer: Medicare Other | Admitting: Family Medicine

## 2021-12-15 DIAGNOSIS — R5383 Other fatigue: Secondary | ICD-10-CM | POA: Diagnosis not present

## 2021-12-15 DIAGNOSIS — M810 Age-related osteoporosis without current pathological fracture: Secondary | ICD-10-CM | POA: Diagnosis not present

## 2021-12-15 DIAGNOSIS — E559 Vitamin D deficiency, unspecified: Secondary | ICD-10-CM | POA: Diagnosis not present

## 2021-12-26 DIAGNOSIS — M2550 Pain in unspecified joint: Secondary | ICD-10-CM | POA: Diagnosis not present

## 2021-12-26 DIAGNOSIS — E559 Vitamin D deficiency, unspecified: Secondary | ICD-10-CM | POA: Diagnosis not present

## 2021-12-26 DIAGNOSIS — M810 Age-related osteoporosis without current pathological fracture: Secondary | ICD-10-CM | POA: Diagnosis not present

## 2022-01-04 DIAGNOSIS — M810 Age-related osteoporosis without current pathological fracture: Secondary | ICD-10-CM | POA: Diagnosis not present

## 2022-01-04 DIAGNOSIS — E559 Vitamin D deficiency, unspecified: Secondary | ICD-10-CM | POA: Diagnosis not present

## 2022-01-04 DIAGNOSIS — M2550 Pain in unspecified joint: Secondary | ICD-10-CM | POA: Diagnosis not present

## 2022-01-04 DIAGNOSIS — R5383 Other fatigue: Secondary | ICD-10-CM | POA: Diagnosis not present

## 2022-01-04 DIAGNOSIS — M545 Low back pain, unspecified: Secondary | ICD-10-CM | POA: Diagnosis not present

## 2022-02-09 ENCOUNTER — Encounter: Payer: Medicare Other | Admitting: Psychology

## 2022-02-16 ENCOUNTER — Encounter: Payer: Medicare Other | Admitting: Psychology

## 2022-02-27 DIAGNOSIS — H43813 Vitreous degeneration, bilateral: Secondary | ICD-10-CM | POA: Diagnosis not present

## 2022-02-27 DIAGNOSIS — H52223 Regular astigmatism, bilateral: Secondary | ICD-10-CM | POA: Diagnosis not present

## 2022-03-13 ENCOUNTER — Ambulatory Visit: Payer: Medicare Other

## 2022-03-27 ENCOUNTER — Ambulatory Visit: Payer: Medicare Other

## 2022-03-27 DIAGNOSIS — H16292 Other keratoconjunctivitis, left eye: Secondary | ICD-10-CM | POA: Diagnosis not present

## 2022-04-04 ENCOUNTER — Ambulatory Visit (INDEPENDENT_AMBULATORY_CARE_PROVIDER_SITE_OTHER): Payer: Medicare Other

## 2022-04-04 VITALS — Ht 64.0 in | Wt 129.0 lb

## 2022-04-04 DIAGNOSIS — Z0000 Encounter for general adult medical examination without abnormal findings: Secondary | ICD-10-CM

## 2022-04-04 NOTE — Progress Notes (Signed)
Subjective:   Kim Daniel is a 76 y.o. female who presents for Medicare Annual (Subsequent) preventive examination.  Review of Systems    Virtual Visit via Telephone Note  I connected with  Kim Daniel on 04/04/22 at  2:00 PM EDT by telephone and verified that I am speaking with the correct person using two identifiers.  Location: Patient: Home Provider: Office Persons participating in the virtual visit: patient/Nurse Health Advisor   I discussed the limitations, risks, security and privacy concerns of performing an evaluation and management service by telephone and the availability of in person appointments. The patient expressed understanding and agreed to proceed.  Interactive audio and video telecommunications were attempted between this nurse and patient, however failed, due to patient having technical difficulties OR patient did not have access to video capability.  We continued and completed visit with audio only.  Some vital signs may be absent or patient reported.   Tillie Rung, LPN  Cardiac Risk Factors include: advanced age (>80men, >61 women)     Objective:    Today's Vitals   04/04/22 1435  Weight: 129 lb (58.5 kg)  Height:  (1.626 m)   Body mass index is 22.14 kg/m.     04/04/2022    2:42 PM 05/27/2018    3:49 PM 05/26/2018   10:35 AM 10/02/2014    9:23 AM 03/03/2012    1:26 AM  Advanced Directives  Does Patient Have a Medical Advance Directive? No Yes Yes Yes Patient has advance directive, copy not in chart  Type of Advance Directive  Living will Living will Living will Living will  Does patient want to make changes to medical advance directive?  No - Patient declined  No - Patient declined   Copy of Healthcare Power of Attorney in Chart?    No - copy requested   Would patient like information on creating a medical advance directive? No - Patient declined      Pre-existing out of facility DNR order (yellow form or pink MOST form)     No     Current Medications (verified) Outpatient Encounter Medications as of 04/04/2022  Medication Sig   benzonatate (TESSALON PERLES) 100 MG capsule Take 1 capsule (100 mg total) by mouth 3 (three) times daily as needed.   buPROPion (WELLBUTRIN XL) 150 MG 24 hr tablet TAKE 1 TABLET BY MOUTH ONCE DAILY   Calcium Carbonate-Vitamin D (CALTRATE 600+D PO) Take 1 tablet by mouth 3 (three) times a week.   citalopram (CELEXA) 40 MG tablet TAKE 1 TABLET BY MOUTH ONCE DAILY   clonazePAM (KLONOPIN) 0.5 MG tablet TAKE 1 TABLET BY MOUTH EVERY DAY AS NEEDED   clotrimazole-betamethasone (LOTRISONE) cream Apply one application to skin twice per day as needed for skin irritation.   fluticasone (FLONASE) 50 MCG/ACT nasal spray PLACE 2 SPRAYS INTO BOTH NOSTRILS DAILY   LINZESS 290 MCG CAPS capsule Take 290 mcg by mouth every morning.    metoprolol tartrate (LOPRESSOR) 50 MG tablet Take 1 tablet by mouth once for procedure.   pantoprazole (PROTONIX) 40 MG tablet TAKE 1 TABLET BY MOUTH ONCE DAILY   phenazopyridine (PYRIDIUM) 200 MG tablet Take 1 tablet (200 mg total) by mouth 3 (three) times daily as needed for pain.   SUMAtriptan (IMITREX) 100 MG tablet TAKE 1 TABLET BY MOUTH AT ONSET OF MIGRAINE. MAY REPEAT DOSE IN 1 HOUR IF HEADACHE NOT RESOLVED   zolpidem (AMBIEN) 5 MG tablet TAKE 1 TABLET BY MOUTH AT  BEDTIME AS NEEDED FOR SLEEP.   No facility-administered encounter medications on file as of 04/04/2022.    Allergies (verified) Codeine and Hydrocodone   History: Past Medical History:  Diagnosis Date   Anxiety    Anxiety and depression    Chronic constipation    Depression    GERD (gastroesophageal reflux disease)    History of hiatal hernia    History of migraine headaches    Seasonal allergic rhinitis    SUI (stress urinary incontinence, female)    Ureteral obstruction, right    Wears contact lenses    Past Surgical History:  Procedure Laterality Date   ANTERIOR CERVICAL DECOMP/DISCECTOMY  FUSION  03-10-2004   C4 -- C6   BREAST BIOPSY Right 1980   BREAST EXCISIONAL BIOPSY Right 1980   COLONOSCOPY WITH PROPOFOL  03-08-2009   CYSTOSCOPY W/ URETERAL STENT PLACEMENT Right 10/02/2014   Procedure: CYSTOSCOPY WITH RIGHT RETROGRADE PYELOGRAM , URETERAL STENT PLACEMENT;  Surgeon: Garnett Farm, MD;  Location: [facility];  Service: Urology;  Laterality: Right;   ESOPHAGOGASTRODUODENOSCOPY  04-02-2002   TOTAL HIP ARTHROPLASTY  03/03/2012   Procedure: TOTAL HIP ARTHROPLASTY;  Surgeon: [name], MD;  Location: [facility];  Service: Orthopedics;  Laterality: Left;   TUBAL LIGATION  1971   VAGINAL HYSTERECTOMY  1976   Family History  Problem Relation Age of Onset   Cirrhosis Mother        non alcohol cirrhosis   Cancer Father 50       lung cancer   Breast cancer Maternal Uncle    Social History   Socioeconomic History   Marital status: Married    Spouse name: Not on file   Number of children: 2   Years of education: Not on file   Highest education level: Not on file  Occupational History   Occupation: human resources  Tobacco Use   Smoking status: Former    Packs/day: 0.50    Years: 20.00    Total pack years: 10.00    Types: Cigarettes    Quit date: 10/02/1999    Years since quitting: 22.5   Smokeless tobacco: Never  Vaping Use   Vaping Use: Never used  Substance and Sexual Activity   Alcohol use: Yes    Alcohol/week: 1.0 standard drink of alcohol    Types: 1 Glasses of wine per week    Comment: occaional   Drug use: No   Sexual activity: Not on file  Other Topics Concern   Not on file  Social History Narrative   Not on file   Social Determinants of Health   Financial Resource Strain: Low Risk  (04/04/2022)   Overall Financial Resource Strain (CARDIA)    Difficulty of Paying Living Expenses: Not hard at all  Food Insecurity: No Food Insecurity (04/04/2022)   Hunger Vital Sign    Worried About Running Out of Food in the Last Year: Never true     Ran Out of Food in the Last Year: Never true  Transportation Needs: Not on file  Physical Activity: Inactive (04/04/2022)   Exercise Vital Sign    Days of Exercise per Week: 0 days    Minutes of Exercise per Session: 0 min  Stress: No Stress Concern Present (04/04/2022)   [employer] of Occupational Health - Occupational Stress Questionnaire    Feeling of Stress : Not at all  Social Connections: Socially Integrated (04/04/2022)   Social Connection and Isolation Panel [NHANES]    Frequency  of Communication with Friends and Family: More than three times a week    Frequency of Social Gatherings with Friends and Family: More than three times a week    Attends Religious Services: More than 4 times per year    Active Member of [employer] or Organizations: Yes    Attends Engineer, structural: More than 4 times per year    Marital Status: Married     Clinical Intake:   Diabetic?  No   Activities of Daily Living    04/04/2022    2:41 PM 08/10/2021    2:22 PM  In your present state of health, do you have any difficulty performing the following activities:  Hearing? 0 0  Vision? 0 0  Difficulty concentrating or making decisions? 0 0  Walking or climbing stairs? 0 0  Dressing or bathing? 0 0  Doing errands, shopping? 0 0  Preparing Food and eating ? N   Using the Toilet? N   In the past six months, have you accidently leaked urine? N   Do you have problems with loss of bowel control? N   Managing your Medications? N   Managing your Finances? N   Housekeeping or managing your Housekeeping? N     Patient Care Team: Kristian Covey, MD as PCP - General (Family Medicine)  Indicate any recent Medical Services you may have received from other than [facility] providers in the past year (date may be approximate).     Assessment:   This is a routine wellness examination for [place].  Hearing/Vision screen Hearing Screening - Comments:: No hearing difficulty Vision  Screening - Comments:: Wears glasses. Followed by Dr Nile Riggs  Dietary issues and exercise activities discussed: Exercise limited by: None identified   Goals Addressed             This Visit's Progress    Stay Healthy         Depression Screen    04/04/2022    2:39 PM 07/08/2018    1:19 PM 06/08/2017    9:51 AM 05/09/2016    3:22 PM 03/19/2015    9:34 AM 03/19/2015    8:55 AM 03/05/2014    2:01 PM  PHQ 2/9 Scores  PHQ - 2 Score 0 0 0 0 0 0 0  PHQ- 9 Score  0         Fall Risk    07/08/2018    1:19 PM 06/08/2017    9:51 AM 05/09/2016    3:22 PM 03/19/2015    9:34 AM 03/19/2015    8:55 AM  Fall Risk   Falls in the past year? Yes No No No No  Number falls in past yr: 1      Injury with Fall? No        FALL RISK PREVENTION PERTAINING TO THE HOME:  Any stairs in or around the home? Yes  If so, are there any without handrails? No  Home free of loose throw rugs in walkways, pet beds, electrical cords, etc? Yes  Adequate lighting in your home to reduce risk of falls? Yes   ASSISTIVE DEVICES UTILIZED TO PREVENT FALLS:  Life alert? Yes  Use of a cane, walker or w/c? No  Grab bars in the bathroom? No  Shower chair or bench in shower? Yes  Elevated toilet seat or a handicapped toilet? Yes   TIMED UP AND GO:  Was the test performed? No . Audio Visit  Cognitive Function:  04/04/2022    2:43 PM  6CIT Screen  What Year? 0 points  What month? 0 points  What time? 0 points  Count back from 20 0 points  Months in reverse 0 points  Repeat phrase 0 points  Total Score 0 points    Immunizations Immunization History  Administered Date(s) Administered   Fluad Quad(high Dose 65+) 08/03/2021   Influenza, High Dose Seasonal PF 07/15/2020   Influenza,inj,Quad PF,6+ Mos 07/03/2013   Influenza-Unspecified 06/23/2014, 07/17/2018   Moderna SARS-COV2 Booster Vaccination 10/09/2020   Moderna Sars-Covid-2 Vaccination 11/28/2019, 12/29/2019   Pneumococcal Conjugate-13  03/19/2015   Pneumococcal Polysaccharide-23 02/11/2013   Td 01/19/2009   Zoster, Live 02/16/2009    TDAP status: Due, Education has been provided regarding the importance of this vaccine. Advised may receive this vaccine at local pharmacy or Health Dept. Aware to provide a copy of the vaccination record if obtained from local pharmacy or Health Dept. Verbalized acceptance and understanding.  Flu Vaccine status: Up to date  Pneumococcal vaccine status: Up to date  Covid-19 vaccine status: Completed vaccines  Qualifies for Shingles Vaccine? Yes   Zostavax completed No   Shingrix Completed?: No.    Education has been provided regarding the importance of this vaccine. Patient has been advised to call insurance company to determine out of pocket expense if they have not yet received this vaccine. Advised may also receive vaccine at local pharmacy or Health Dept. Verbalized acceptance and understanding.  Screening Tests Health Maintenance  Topic Date Due   Hepatitis C Screening  04/05/2022 (Originally 09/22/1964)   TETANUS/TDAP  04/06/2022 (Originally 01/20/2019)   Zoster Vaccines- Shingrix (1 of 2) 07/05/2022 (Originally 09/22/1995)   INFLUENZA VACCINE  05/23/2022   Pneumonia Vaccine 3465+ Years old  Completed   DEXA SCAN  Completed   HPV VACCINES  Aged Out   COLONOSCOPY (Pts [id number] Insurance coverage will need to be confirmed)  Discontinued   COVID-19 Vaccine  Discontinued    Health Maintenance  There are no preventive care reminders to display for this patient.   Colorectal cancer screening: No longer required.   Mammogram status: No longer required due to Age.    Lung Cancer Screening: (Low Dose CT Chest recommended if Age 85-80 years, 30 pack-year currently smoking OR have quit w/in 15years.) does not qualify.     Additional Screening:  Hepatitis C Screening: does qualify; Completed   Vision Screening: Recommended annual ophthalmology exams for early detection of  glaucoma and other disorders of the eye. Is the patient up to date with their annual eye exam?  Yes  Who is the provider or what is the name of the office in which the patient attends annual eye exams? Dr Nile RiggsShapiro If pt is not established with a provider, would they like to be referred to a provider to establish care? No .   Dental Screening: Recommended annual dental exams for proper oral hygiene  Community Resource Referral / Chronic Care Management:  CRR required this visit?  No   CCM required this visit?  No      Plan:     I have personally reviewed and noted the following in the patient's chart:   Medical and social history Use of alcohol, tobacco or illicit drugs  Current medications and supplements including opioid prescriptions.  Functional ability and status Nutritional status Physical activity Advanced directives List of other physicians Hospitalizations, surgeries, and ER visits in previous 12 months Vitals Screenings to include cognitive, depression, and falls Referrals and  appointments  In addition, I have reviewed and discussed with patient certain preventive protocols, quality metrics, and best practice recommendations. A written personalized care plan for preventive services as well as general preventive health recommendations were provided to patient.     Tillie Rung, LPN   03/29/3015   Nurse Notes: None

## 2022-04-04 NOTE — Patient Instructions (Addendum)
Kim Daniel , Thank you for taking time to come for your Medicare Wellness Visit. I appreciate your ongoing commitment to your health goals. Please review the following plan we discussed and let me know if I can assist you in the future.   These are the goals we discussed:  Goals      Stay Healthy        This is a list of the screening recommended for you and due dates:  Health Maintenance  Topic Date Due   Hepatitis C Screening: USPSTF Recommendation to screen - Ages 18-79 yo.  04/05/2022*   Tetanus Vaccine  04/06/2022*   Zoster (Shingles) Vaccine (1 of 2) 07/05/2022*   Flu Shot  05/23/2022   Pneumonia Vaccine  Completed   DEXA scan (bone density measurement)  Completed   HPV Vaccine  Aged Out   Colon Cancer Screening  Discontinued   COVID-19 Vaccine  Discontinued  *Topic was postponed. The date shown is not the original due date.    Kim Daniel , Thank you for taking time to come for your Medicare Wellness Visit. I appreciate your ongoing commitment to your health goals. Please review the following plan we discussed and let me know if I can assist you in the future.   These are the goals we discussed:  Goals      Stay Healthy        This is a list of the screening recommended for you and due dates:  Health Maintenance  Topic Date Due   Hepatitis C Screening: USPSTF Recommendation to screen - Ages 18-79 yo.  04/05/2022*   Tetanus Vaccine  04/06/2022*   Zoster (Shingles) Vaccine (1 of 2) 07/05/2022*   Flu Shot  05/23/2022   Pneumonia Vaccine  Completed   DEXA scan (bone density measurement)  Completed   HPV Vaccine  Aged Out   Colon Cancer Screening  Discontinued   COVID-19 Vaccine  Discontinued  *Topic was postponed. The date shown is not the original due date.   Advanced directives: No  Conditions/risks identified: None  Next appointment: Follow up in one year for your annual wellness visit    Preventive Care 65 Years and Older, Female Preventive care refers  to lifestyle choices and visits with your health care provider that can promote health and wellness. What does preventive care include? A yearly physical exam. This is also called an annual well check. Dental exams once or twice a year. Routine eye exams. Ask your health care provider how often you should have your eyes checked. Personal lifestyle choices, including: Daily care of your teeth and gums. Regular physical activity. Eating a healthy diet. Avoiding tobacco and drug use. Limiting alcohol use. Practicing safe sex. Taking low-dose aspirin every day. Taking vitamin and mineral supplements as recommended by your health care provider. What happens during an annual well check? The services and screenings done by your health care provider during your annual well check will depend on your age, overall health, lifestyle risk factors, and family history of disease. Counseling  Your health care provider may ask you questions about your: Alcohol use. Tobacco use. Drug use. Emotional well-being. Home and relationship well-being. Sexual activity. Eating habits. History of falls. Memory and ability to understand (cognition). Work and work Astronomer. Reproductive health. Screening  You may have the following tests or measurements: Height, weight, and BMI. Blood pressure. Lipid and cholesterol levels. These may be checked every 5 years, or more frequently if you are over 19 years old.  Skin check. Lung cancer screening. You may have this screening every year starting at age 77 if you have a 30-pack-year history of smoking and currently smoke or have quit within the past 15 years. Fecal occult blood test (FOBT) of the stool. You may have this test every year starting at age 77. Flexible sigmoidoscopy or colonoscopy. You may have a sigmoidoscopy every 5 years or a colonoscopy every 10 years starting at age 77. Hepatitis C blood test. Hepatitis B blood test. Sexually transmitted  disease (STD) testing. Diabetes screening. This is done by checking your blood sugar (glucose) after you have not eaten for a while (fasting). You may have this done every 1-3 years. Bone density scan. This is done to screen for osteoporosis. You may have this done starting at age 77. Mammogram. This may be done every 1-2 years. Talk to your health care provider about how often you should have regular mammograms. Talk with your health care provider about your test results, treatment options, and if necessary, the need for more tests. Vaccines  Your health care provider may recommend certain vaccines, such as: Influenza vaccine. This is recommended every year. Tetanus, diphtheria, and acellular pertussis (Tdap, Td) vaccine. You may need a Td booster every 10 years. Zoster vaccine. You may need this after age 77. Pneumococcal 13-valent conjugate (PCV13) vaccine. One dose is recommended after age 77. Pneumococcal polysaccharide (PPSV23) vaccine. One dose is recommended after age 77. Talk to your health care provider about which screenings and vaccines you need and how often you need them. This information is not intended to replace advice given to you by your health care provider. Make sure you discuss any questions you have with your health care provider. Document Released: 11/05/2015 Document Revised: 06/28/2016 Document Reviewed: 08/10/2015 Elsevier Interactive Patient Education  2017 [employer]  Fall Prevention in the Home Falls can cause injuries. They can happen to people of all ages. There are many things you can do to make your home safe and to help prevent falls. What can I do on the outside of my home? Regularly fix the edges of walkways and driveways and fix any cracks. Remove anything that might make you trip as you walk through a door, such as a raised step or threshold. Trim any bushes or trees on the path to your home. Use bright outdoor lighting. Clear any walking paths of  anything that might make someone trip, such as rocks or tools. Regularly check to see if handrails are loose or broken. Make sure that both sides of any steps have handrails. Any raised decks and porches should have guardrails on the edges. Have any leaves, snow, or ice cleared regularly. Use sand or salt on walking paths during winter. Clean up any spills in your garage right away. This includes oil or grease spills. What can I do in the bathroom? Use night lights. Install grab bars by the toilet and in the tub and shower. Do not use towel bars as grab bars. Use non-skid mats or decals in the tub or shower. If you need to sit down in the shower, use a plastic, non-slip stool. Keep the floor dry. Clean up any water that spills on the floor as soon as it happens. Remove soap buildup in the tub or shower regularly. Attach bath mats securely with double-sided non-slip rug tape. Do not have throw rugs and other things on the floor that can make you trip. What can I do in the bedroom? Use night lights.  Make sure that you have a light by your bed that is easy to reach. Do not use any sheets or blankets that are too big for your bed. They should not hang down onto the floor. Have a firm chair that has side arms. You can use this for support while you get dressed. Do not have throw rugs and other things on the floor that can make you trip. What can I do in the kitchen? Clean up any spills right away. Avoid walking on wet floors. Keep items that you use a lot in easy-to-reach places. If you need to reach something above you, use a strong step stool that has a grab bar. Keep electrical cords out of the way. Do not use floor polish or wax that makes floors slippery. If you must use wax, use non-skid floor wax. Do not have throw rugs and other things on the floor that can make you trip. What can I do with my stairs? Do not leave any items on the stairs. Make sure that there are handrails on both  sides of the stairs and use them. Fix handrails that are broken or loose. Make sure that handrails are as long as the stairways. Check any carpeting to make sure that it is firmly attached to the stairs. Fix any carpet that is loose or worn. Avoid having throw rugs at the top or bottom of the stairs. If you do have throw rugs, attach them to the floor with carpet tape. Make sure that you have a light switch at the top of the stairs and the bottom of the stairs. If you do not have them, ask someone to add them for you. What else can I do to help prevent falls? Wear shoes that: Do not have high heels. Have rubber bottoms. Are comfortable and fit you well. Are closed at the toe. Do not wear sandals. If you use a stepladder: Make sure that it is fully opened. Do not climb a closed stepladder. Make sure that both sides of the stepladder are locked into place. Ask someone to hold it for you, if possible. Clearly mark and make sure that you can see: Any grab bars or handrails. First and last steps. Where the edge of each step is. Use tools that help you move around (mobility aids) if they are needed. These include: Canes. Walkers. Scooters. Crutches. Turn on the lights when you go into a dark area. Replace any light bulbs as soon as they burn out. Set up your furniture so you have a clear path. Avoid moving your furniture around. If any of your floors are uneven, fix them. If there are any pets around you, be aware of where they are. Review your medicines with your doctor. Some medicines can make you feel dizzy. This can increase your chance of falling. Ask your doctor what other things that you can do to help prevent falls. This information is not intended to replace advice given to you by your health care provider. Make sure you discuss any questions you have with your health care provider. Document Released: 08/05/2009 Document Revised: 03/16/2016 Document Reviewed: 11/13/2014 Elsevier  Interactive Patient Education  2017 [employer].

## 2022-04-05 DIAGNOSIS — M2550 Pain in unspecified joint: Secondary | ICD-10-CM | POA: Diagnosis not present

## 2022-04-05 DIAGNOSIS — Z96642 Presence of left artificial hip joint: Secondary | ICD-10-CM | POA: Diagnosis not present

## 2022-04-05 DIAGNOSIS — R760 Raised antibody titer: Secondary | ICD-10-CM | POA: Diagnosis not present

## 2022-04-05 DIAGNOSIS — Z791 Long term (current) use of non-steroidal anti-inflammatories (NSAID): Secondary | ICD-10-CM | POA: Diagnosis not present

## 2022-04-19 DIAGNOSIS — M25551 Pain in right hip: Secondary | ICD-10-CM | POA: Diagnosis not present

## 2022-04-19 DIAGNOSIS — M25552 Pain in left hip: Secondary | ICD-10-CM | POA: Diagnosis not present

## 2022-04-19 DIAGNOSIS — M47816 Spondylosis without myelopathy or radiculopathy, lumbar region: Secondary | ICD-10-CM | POA: Diagnosis not present

## 2022-05-10 ENCOUNTER — Telehealth: Payer: Self-pay | Admitting: Family Medicine

## 2022-05-10 NOTE — Telephone Encounter (Signed)
Pt called to ask if MD could send a Rx for: LINZESS 290 MCG CAPS capsule   Start 06/19/2014    Sig - Route: Take 290 mcg by mouth every morning.  - Oral   Class: Historical Med    Pt understands she may have to come in for an OV to renew this Rx.  Please advise.  Please send to: [facility] [place], [place] - [address] Phone:  [phone]  Fax:  [phone]

## 2022-05-11 ENCOUNTER — Other Ambulatory Visit: Payer: Self-pay | Admitting: Family Medicine

## 2022-05-11 DIAGNOSIS — F5101 Primary insomnia: Secondary | ICD-10-CM

## 2022-05-11 NOTE — Telephone Encounter (Signed)
Last refill Zolpidem-11/15/21--90  tabs, 1 refill Last OV- 07/27/21  No future OV scheduled.

## 2022-05-12 NOTE — Telephone Encounter (Signed)
Left a message for the pt to return my call.  

## 2022-05-15 DIAGNOSIS — M7061 Trochanteric bursitis, right hip: Secondary | ICD-10-CM | POA: Diagnosis not present

## 2022-05-15 DIAGNOSIS — M7062 Trochanteric bursitis, left hip: Secondary | ICD-10-CM | POA: Diagnosis not present

## 2022-05-15 DIAGNOSIS — Z96642 Presence of left artificial hip joint: Secondary | ICD-10-CM | POA: Diagnosis not present

## 2022-05-15 NOTE — Telephone Encounter (Signed)
Left a message for the pt to return my call.  

## 2022-05-17 MED ORDER — LINACLOTIDE 290 MCG PO CAPS: 290.0000 ug | ORAL_CAPSULE | Freq: Every morning | ORAL | 2 refills | Status: DC

## 2022-05-17 NOTE — Telephone Encounter (Signed)
Rx sent 

## 2022-05-17 NOTE — Telephone Encounter (Signed)
I spoke with the pt and she stated this rx was prescribed by Gi in order to help her with bowel movements. Pt reported this rx has help her with this so that is why she is requesting refill.

## 2022-05-17 NOTE — Addendum Note (Signed)
Addended by: Christy Sartorius on: 05/17/2022 01:40 PM   Modules accepted: Orders

## 2022-05-17 NOTE — Addendum Note (Signed)
Addended by: Christy Sartorius on: 05/17/2022 01:33 PM   Modules accepted: Orders

## 2022-06-15 ENCOUNTER — Encounter: Payer: Self-pay | Admitting: Adult Health

## 2022-06-15 ENCOUNTER — Ambulatory Visit (INDEPENDENT_AMBULATORY_CARE_PROVIDER_SITE_OTHER): Payer: Medicare Other | Admitting: Adult Health

## 2022-06-15 VITALS — BP 110/80 | HR 65 | Temp 98.2°F | Ht 64.0 in | Wt 130.0 lb

## 2022-06-15 DIAGNOSIS — M7521 Bicipital tendinitis, right shoulder: Secondary | ICD-10-CM | POA: Diagnosis not present

## 2022-06-15 MED ORDER — METHYLPREDNISOLONE 4 MG PO TBPK: ORAL_TABLET | ORAL | 0 refills | Status: DC

## 2022-06-15 NOTE — Progress Notes (Signed)
Subjective:    Patient ID: Kim Daniel, female    DOB: [birth year], 76 y.o.   MRN: [id number]  HPI 76 year old female who  has a past medical history of Anxiety, Anxiety and depression, Chronic constipation, Depression, GERD (gastroesophageal reflux disease), History of hiatal hernia, History of migraine headaches, Seasonal allergic rhinitis, SUI (stress urinary incontinence, female), Ureteral obstruction, right, and Wears contact lenses.  She presents to the office today for an acute issue  Per patient report she was woken up around 4 AM about 4 days ago with pain to her right upper arm.  This pain radiates down towards her wrist.  Pain is described as dull pain and is pretty constant.  The only alleviating factors that she has noticed has been taking prescribed naproxen and using a heating pad.  Denies any trauma or aggravating therapy.  She has not had any recent vaccinations in her right arm   Review of Systems See HPI   Past Medical History:  Diagnosis Date   Anxiety    Anxiety and depression    Chronic constipation    Depression    GERD (gastroesophageal reflux disease)    History of hiatal hernia    History of migraine headaches    Seasonal allergic rhinitis    SUI (stress urinary incontinence, female)    Ureteral obstruction, right    Wears contact lenses     Social History   Socioeconomic History   Marital status: Married    Spouse name: Not on file   Number of children: 2   Years of education: Not on file   Highest education level: Not on file  Occupational History   Occupation: human resources  Tobacco Use   Smoking status: Former    Packs/day: 0.50    Years: 20.00    Total pack years: 10.00    Types: Cigarettes    Quit date: 10/02/1999    Years since quitting: 22.7   Smokeless tobacco: Never  Vaping Use   Vaping Use: Never used  Substance and Sexual Activity   Alcohol use: Yes    Alcohol/week: 1.0 standard drink of alcohol    Types: 1 Glasses  of wine per week    Comment: occaional   Drug use: No   Sexual activity: Not on file  Other Topics Concern   Not on file  Social History Narrative   Not on file   Social Determinants of Health   Financial Resource Strain: Low Risk  (04/04/2022)   Overall Financial Resource Strain (CARDIA)    Difficulty of Paying Living Expenses: Not hard at all  Food Insecurity: No Food Insecurity (04/04/2022)   Hunger Vital Sign    Worried About Running Out of Food in the Last Year: Never true    Ran Out of Food in the Last Year: Never true  Transportation Needs: Not on file  Physical Activity: Inactive (04/04/2022)   Exercise Vital Sign    Days of Exercise per Week: 0 days    Minutes of Exercise per Session: 0 min  Stress: No Stress Concern Present (04/04/2022)   [employer] of Occupational Health - Occupational Stress Questionnaire    Feeling of Stress : Not at all  Social Connections: Socially Integrated (04/04/2022)   Social Connection and Isolation Panel [NHANES]    Frequency of Communication with Friends and Family: More than three times a week    Frequency of Social Gatherings with Friends and Family: More than three times a week  Attends Religious Services: More than 4 times per year    Active Member of Clubs or Organizations: Yes    Attends Banker Meetings: More than 4 times per year    Marital Status: Married  Catering manager Violence: Not At Risk (04/04/2022)   Humiliation, Afraid, Rape, and Kick questionnaire    Fear of Current or Ex-Partner: No    Emotionally Abused: No    Physically Abused: No    Sexually Abused: No    Past Surgical History:  Procedure Laterality Date   ANTERIOR CERVICAL DECOMP/DISCECTOMY FUSION  03-10-2004   C4 -- C6   BREAST BIOPSY Right 1980   BREAST EXCISIONAL BIOPSY Right 1980   COLONOSCOPY WITH PROPOFOL  03-08-2009   CYSTOSCOPY W/ URETERAL STENT PLACEMENT Right 10/02/2014   Procedure: CYSTOSCOPY WITH RIGHT RETROGRADE PYELOGRAM ,  URETERAL STENT PLACEMENT;  Surgeon: Garnett Farm, MD;  Location: [facility];  Service: Urology;  Laterality: Right;   ESOPHAGOGASTRODUODENOSCOPY  04-02-2002   TOTAL HIP ARTHROPLASTY  03/03/2012   Procedure: TOTAL HIP ARTHROPLASTY;  Surgeon: [name], MD;  Location: [facility];  Service: Orthopedics;  Laterality: Left;   TUBAL LIGATION  1971   VAGINAL HYSTERECTOMY  1976    Family History  Problem Relation Age of Onset   Cirrhosis Mother        non alcohol cirrhosis   Cancer Father 76       lung cancer   Breast cancer Maternal Uncle     Allergies  Allergen Reactions   Codeine Other (See Comments)   Hydrocodone Nausea And Vomiting and Other (See Comments)    "get dizzy and spaced out" Other reaction(s): Other (See Comments) "get dizzy and spaced out"    Current Outpatient Medications on File Prior to Visit  Medication Sig Dispense Refill   benzonatate (TESSALON PERLES) 100 MG capsule Take 1 capsule (100 mg total) by mouth 3 (three) times daily as needed. 20 capsule 0   buPROPion (WELLBUTRIN XL) 150 MG 24 hr tablet TAKE 1 TABLET BY MOUTH ONCE DAILY 90 tablet 1   Calcium Carbonate-Vitamin D (CALTRATE 600+D PO) Take 1 tablet by mouth 3 (three) times a week.     clonazePAM (KLONOPIN) 0.5 MG tablet Take 1 tablet by mouth daily as needed.     clotrimazole-betamethasone (LOTRISONE) cream Apply one application to skin twice per day as needed for skin irritation. 30 g 1   diclofenac (VOLTAREN) 75 MG EC tablet 1 tablet as needed     fluticasone (FLONASE) 50 MCG/ACT nasal spray PLACE 2 SPRAYS INTO BOTH NOSTRILS DAILY 48 g 3   linaclotide (LINZESS) 290 MCG CAPS capsule Take 1 capsule (290 mcg total) by mouth every morning. 30 capsule 2   metoprolol tartrate (LOPRESSOR) 50 MG tablet Take 1 tablet by mouth once for procedure. 1 tablet 0   pantoprazole (PROTONIX) 40 MG tablet TAKE 1 TABLET BY MOUTH ONCE DAILY 90 tablet 3   phenazopyridine (PYRIDIUM) 200 MG tablet Take 1 tablet  (200 mg total) by mouth 3 (three) times daily as needed for pain. 30 tablet 0   SUMAtriptan (IMITREX) 100 MG tablet TAKE 1 TABLET BY MOUTH AT ONSET OF MIGRAINE. MAY REPEAT DOSE IN 1 HOUR IF HEADACHE NOT RESOLVED 9 tablet 0   triamcinolone cream (KENALOG) 0.1 % APPLY A THIN LAYER TO THE AFFECTED AREA(S) BY TOPICAL ROUTE 2 TIMES PER DAY     zolpidem (AMBIEN) 5 MG tablet TAKE 1 TABLET BY MOUTH AT BEDTIME AS  NEEDED FOR SLEEP. 90 tablet 0   naproxen (NAPROSYN) 375 MG tablet      silver sulfADIAZINE (SILVADENE) 1 % cream APPLY TO AFFECTED AREA EVERY DAY     tobramycin-dexamethasone (TOBRADEX) ophthalmic solution INSTILL 1 DROP INTO LEFT EYE 4 TIMES DAILY FOR 7 DAYS     No current facility-administered medications on file prior to visit.    BP 110/80   Pulse 65   Temp 98.2 F (36.8 C) (Oral)   Ht 5\' 4"  (1.626 m)   Wt 130 lb (59 kg)   SpO2 98%   BMI 22.31 kg/m       Objective:   Physical Exam Vitals and nursing note reviewed.  Constitutional:      Appearance: Normal appearance.  Musculoskeletal:        General: Tenderness (Tenderness along bicep tendon from below the shoulder to above the elbow.) present. No swelling. Normal range of motion.  Skin:    General: Skin is warm and dry.  Neurological:     General: No focal deficit present.     Mental Status: She is alert and oriented to person, place, and time.  Psychiatric:        Mood and Affect: Mood normal.        Behavior: Behavior normal.        Thought Content: Thought content normal.        Judgment: Judgment normal.       Assessment & Plan:  1. Biceps tendonitis on right -Continue with naproxen and heating pad.  Advised to try and rest the right arm.  Will prescribe a Medrol Dosepak to help with inflammation. - methylPREDNISolone (MEDROL DOSEPAK) 4 MG TBPK tablet; Take as directed  Dispense: 21 tablet; Refill: 0  , NP

## 2022-06-19 DIAGNOSIS — M79631 Pain in right forearm: Secondary | ICD-10-CM | POA: Diagnosis not present

## 2022-06-23 ENCOUNTER — Ambulatory Visit (INDEPENDENT_AMBULATORY_CARE_PROVIDER_SITE_OTHER): Payer: Medicare Other | Admitting: Family Medicine

## 2022-06-23 VITALS — BP 122/80 | HR 69 | Temp 98.1°F | Wt 135.0 lb

## 2022-06-23 DIAGNOSIS — M7711 Lateral epicondylitis, right elbow: Secondary | ICD-10-CM | POA: Diagnosis not present

## 2022-06-23 DIAGNOSIS — M25511 Pain in right shoulder: Secondary | ICD-10-CM

## 2022-06-23 NOTE — Patient Instructions (Signed)
Try the topical Voltaren/Diclofenac for the elbow.

## 2022-06-23 NOTE — Progress Notes (Signed)
Established Patient Office Visit  Subjective   Patient ID: Kim Daniel, female    DOB: 11/12/1945  Age: 76 y.o. MRN: [id number]  Chief Complaint  Patient presents with   Arm Pain    Pt reports R. Arm  going on 2 wks. Dull pain. Taking Naproxen and diclofenac for pain.    HPI   2-week history of right upper extremity pain.  She woke up with this 1 day.  Denies any injury.  She describes as a dull ache.  Pain seems to be located mostly left lateral elbow but also up in the deltoid region.  Denies any cervical neck pain.  Heat helps.  She is also taken some naproxen which helps somewhat.  Denies any upper extremity numbness or weakness.  Pain is worse with gripping and lifting.  Denies any fall.  She has had remote history of cervical fusion C4-C6-but denies any recent neck pain whatsoever.  Past Medical History:  Diagnosis Date   Anxiety    Anxiety and depression    Chronic constipation    Depression    GERD (gastroesophageal reflux disease)    History of hiatal hernia    History of migraine headaches    Seasonal allergic rhinitis    SUI (stress urinary incontinence, female)    Ureteral obstruction, right    Wears contact lenses    Past Surgical History:  Procedure Laterality Date   ANTERIOR CERVICAL DECOMP/DISCECTOMY FUSION  03-10-2004   C4 -- C6   BREAST BIOPSY Right 1980   BREAST EXCISIONAL BIOPSY Right 1980   COLONOSCOPY WITH PROPOFOL  03-08-2009   CYSTOSCOPY W/ URETERAL STENT PLACEMENT Right 10/02/2014   Procedure: CYSTOSCOPY WITH RIGHT RETROGRADE PYELOGRAM , URETERAL STENT PLACEMENT;  Surgeon: Garnett Farm, MD;  Location: [facility];  Service: Urology;  Laterality: Right;   ESOPHAGOGASTRODUODENOSCOPY  04-02-2002   TOTAL HIP ARTHROPLASTY  03/03/2012   Procedure: TOTAL HIP ARTHROPLASTY;  Surgeon: [name], MD;  Location: [facility];  Service: Orthopedics;  Laterality: Left;   TUBAL LIGATION  1971   VAGINAL HYSTERECTOMY  1976    reports that  she quit smoking about 22 years ago. Her smoking use included cigarettes. She has a 10.00 pack-year smoking history. She has never used smokeless tobacco. She reports current alcohol use of about 1.0 standard drink of alcohol per week. She reports that she does not use drugs. family history includes Breast cancer in her maternal uncle; Cancer (age of onset: 90) in her father; Cirrhosis in her mother. Allergies  Allergen Reactions   Codeine Other (See Comments)   Hydrocodone Nausea And Vomiting and Other (See Comments)    "get dizzy and spaced out" Other reaction(s): Other (See Comments) "get dizzy and spaced out"    Review of Systems  Constitutional:  Negative for weight loss.  Cardiovascular:  Negative for chest pain.  Musculoskeletal:  Negative for neck pain.  Neurological:  Negative for tingling and weakness.      Objective:     BP 122/80 (BP Location: Left Arm, Patient Position: Sitting, Cuff Size: Normal)   Pulse 69   Temp 98.1 F (36.7 C) (Oral)   Wt 135 lb (61.2 kg)   SpO2 97%   BMI 23.17 kg/m    Physical Exam Vitals reviewed.  Constitutional:      Appearance: Normal appearance.  Cardiovascular:     Rate and Rhythm: Normal rate and regular rhythm.  Pulmonary:     Effort: Pulmonary effort is normal.  Breath sounds: Normal breath sounds.  Musculoskeletal:     Comments: Right upper extremity reveals no edema.  No erythema.  No ecchymosis.  Good range of motion elbow and shoulder.  No pain with supination and pronation of the right forearm.  She does have some tenderness over the lateral epicondylar region and mild pain with wrist extension against resistance.  She has some pain with internal rotation and abduction of the shoulder greater than 90 degrees.  No rotator cuff weakness.  No biceps tenderness.  Neurological:     Mental Status: She is alert.     Comments: Full strength upper extremity.  Symmetric reflexes.  Normal sensory function throughout.      No  results found for any visits on 06/23/22.    The ASCVD Risk score (Arnett DK, et al., 2019) failed to calculate for the following reasons:   Cannot find a previous HDL lab   Cannot find a previous total cholesterol lab    Assessment & Plan:   Problem List Items Addressed This Visit   None Visit Diagnoses     Right lateral epicondylitis    -  Primary   Acute pain of right shoulder         Suspect she has combination of right lateral epicondylitis and probably some right shoulder rotator cuff tendinitis.  Doubt cervical in nature based on her symptoms and exam  -Caution with oral non-steroidals -Try topical diclofenac right lateral elbow -Recommend some icing right lateral epicondylar region -Maintain range of motion right shoulder -Consider steroid injection right shoulder and possibly elbow if not improving over the next few weeks  No follow-ups on file.    Evelena Peat, MD

## 2022-07-03 DIAGNOSIS — M810 Age-related osteoporosis without current pathological fracture: Secondary | ICD-10-CM | POA: Diagnosis not present

## 2022-07-03 DIAGNOSIS — E559 Vitamin D deficiency, unspecified: Secondary | ICD-10-CM | POA: Diagnosis not present

## 2022-07-24 ENCOUNTER — Other Ambulatory Visit: Payer: Self-pay | Admitting: Family Medicine

## 2022-07-24 DIAGNOSIS — F5101 Primary insomnia: Secondary | ICD-10-CM

## 2022-08-11 DIAGNOSIS — M7061 Trochanteric bursitis, right hip: Secondary | ICD-10-CM | POA: Diagnosis not present

## 2022-08-11 DIAGNOSIS — M7062 Trochanteric bursitis, left hip: Secondary | ICD-10-CM | POA: Diagnosis not present

## 2022-08-11 DIAGNOSIS — M25551 Pain in right hip: Secondary | ICD-10-CM | POA: Diagnosis not present

## 2022-08-29 ENCOUNTER — Other Ambulatory Visit: Payer: Self-pay | Admitting: Family Medicine

## 2022-09-05 ENCOUNTER — Ambulatory Visit (INDEPENDENT_AMBULATORY_CARE_PROVIDER_SITE_OTHER): Payer: Medicare Other | Admitting: Family Medicine

## 2022-09-05 VITALS — BP 102/60 | HR 88 | Temp 98.0°F | Wt 134.6 lb

## 2022-09-05 DIAGNOSIS — Z23 Encounter for immunization: Secondary | ICD-10-CM | POA: Diagnosis not present

## 2022-09-05 DIAGNOSIS — F322 Major depressive disorder, single episode, severe without psychotic features: Secondary | ICD-10-CM | POA: Diagnosis not present

## 2022-09-05 MED ORDER — BUPROPION HCL ER (XL) 150 MG PO TB24: 150.0000 mg | ORAL_TABLET | Freq: Every day | ORAL | 1 refills | Status: DC

## 2022-09-05 NOTE — Progress Notes (Signed)
Established Patient Office Visit  Subjective   Patient ID: Kim Daniel, female    DOB: 1946-06-01  Age: 76 y.o. MRN: [id number]  Chief Complaint  Patient presents with   Fussy    Has noticed se has been getting upset over the least things going on about 3 months. Does not want to go anywhere or do anything. Has been taking an antidepressant.     HPI   Sawyer has longstanding history of depression.  She has been on Celexa with high dosage of 40 mg daily for years. She states that for the past few months at least she has had some increased depression symptoms with anhedonia, irritability, depressed mood, low motivation.  She is taking her Celexa regularly.  She had some fleeting suicidal thoughts with states she has no risk at this point of carrying out anything.  She states 30 years ago she was admitted to [facility] for about 3 weeks.  She has had counseling in the past.  She feels like a good part of her depression centers around the fact that she retired from a company should be with 25 years March of 2022.  She states that she misses the day-to-day interaction with people.  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  Past Medical History:  Diagnosis Date   Anxiety    Anxiety and depression    Chronic constipation    Depression    GERD (gastroesophageal reflux disease)    History of hiatal hernia    History of migraine headaches    Seasonal allergic rhinitis    SUI (stress urinary incontinence, female)    Ureteral obstruction, right    Wears contact lenses    Past Surgical History:  Procedure Laterality Date   ANTERIOR CERVICAL DECOMP/DISCECTOMY FUSION  03-10-2004   C4 -- C6   BREAST BIOPSY Right 1980   BREAST EXCISIONAL BIOPSY Right 1980   COLONOSCOPY WITH PROPOFOL  03-08-2009   CYSTOSCOPY W/ URETERAL STENT PLACEMENT Right 10/02/2014   Procedure: CYSTOSCOPY WITH RIGHT RETROGRADE PYELOGRAM , URETERAL STENT PLACEMENT;   Surgeon: Garnett Farm, MD;  Location: [facility];  Service: Urology;  Laterality: Right;   ESOPHAGOGASTRODUODENOSCOPY  04-02-2002   TOTAL HIP ARTHROPLASTY  03/03/2012   Procedure: TOTAL HIP ARTHROPLASTY;  Surgeon: [name], MD;  Location: [facility];  Service: Orthopedics;  Laterality: Left;   TUBAL LIGATION  1971   VAGINAL HYSTERECTOMY  1976    reports that she quit smoking about 22 years ago. Her smoking use included cigarettes. She has a 10.00 pack-year smoking history. She has never used smokeless tobacco. She reports current alcohol use of about 1.0 standard drink of alcohol per week. She reports that she does not use drugs. family history includes Breast cancer in her maternal uncle; Cancer (age of onset: 29) in her father; Cirrhosis in her mother. Allergies  Allergen Reactions   Codeine Other (See Comments)   Hydrocodone Nausea And Vomiting and Other (See Comments)    "get dizzy and spaced out" Other reaction(s): Other (See Comments) "get dizzy and spaced out"    Review of Systems  Constitutional:  Negative for weight loss.  Cardiovascular:  Negative for chest pain.  Neurological:  Negative for headaches.  Psychiatric/Behavioral:  Positive for depression. Negative for substance abuse. The patient has insomnia.       Objective:     BP 102/60 (BP Location: Left Arm, Patient Position: Sitting, Cuff Size: Normal)  Pulse 88   Temp 98 F (36.7 C) (Oral)   Wt 134 lb 9.6 oz (61.1 kg)   SpO2 93%   BMI 23.10 kg/m    Physical Exam Vitals reviewed.  Constitutional:      Appearance: Normal appearance.  Cardiovascular:     Rate and Rhythm: Normal rate and regular rhythm.  Pulmonary:     Effort: Pulmonary effort is normal.     Breath sounds: Normal breath sounds.  Neurological:     Mental Status: She is alert.  Psychiatric:     Comments: PHQ-9 equals 22      No results found for any visits on 09/05/22.    The ASCVD Risk score (Arnett DK, et al.,  2019) failed to calculate for the following reasons:   Cannot find a previous HDL lab   Cannot find a previous total cholesterol lab    Assessment & Plan:   Problem List Items Addressed This Visit   None Visit Diagnoses     Depression, major, single episode, severe (HCC)    -  Primary   Relevant Medications   citalopram (CELEXA) 40 MG tablet   buPROPion (WELLBUTRIN XL) 150 MG 24 hr tablet   Need for influenza vaccination       Relevant Orders   Flu Vaccine QUAD High Dose(Fluad) (Completed)     Patient has longstanding history of recurrent depression.  She had 3 months now progressive symptoms as above with PHQ 9 score of 22.  No active suicidal ideation.  She is already on high-dose Celexa.  We did discuss addition of Wellbutrin XL 150 mg once daily to target dopamine and norepinephrine.  She is aware of potential for being more anxious but hopefully this will help with her depression, low motivation, difficulty concentrating, etc.  -We also discussed potential counseling at this point she would like to try medication first -Set up 3-week follow-up.  Follow-up sooner for any worsening symptoms or if not tolerating medication.  Return in about 3 weeks (around 09/26/2022).    Evelena Peat, MD

## 2022-10-05 ENCOUNTER — Telehealth: Payer: Self-pay

## 2022-10-05 NOTE — Telephone Encounter (Signed)
Caller states she recently started bupropion 150 mg qd 2-3 weeks ago. after taking it the 2nd day, she felt weak and shaky and dizzy. Stopped it after the 3rd day. has not taken the medication for about 3 weeks. no more dizziness. she is not going to take the bupropion any more  10/03/2022 12:40:00 PM See PCP within 24 Hours Stringer, RN, Dwana Curd  Comments User: Art Buff, RN Date/Time Lamount Cohen Time): 10/03/2022 12:39:33 PM please call pt back regarding buproprion as she is not going to take it any more due to dizziness, weakness and shaking Referrals REFERRED TO PCP OFFICE  10/05/22 1226 - Last OV for depression was 09/05/22 at which time PCP requested pt f/u after starting medication. Pt notified that Dr Karen Kitchens wanted to see her back in 3 weeks to f/u on depression & medication management. Appt scheduled for 10/09/22 with PCP.

## 2022-10-09 ENCOUNTER — Ambulatory Visit: Payer: Medicare Other | Admitting: Family Medicine

## 2022-10-27 ENCOUNTER — Other Ambulatory Visit: Payer: Self-pay | Admitting: Family Medicine

## 2022-10-31 ENCOUNTER — Other Ambulatory Visit: Payer: Self-pay | Admitting: Family Medicine

## 2022-10-31 NOTE — Telephone Encounter (Signed)
Message sent to PCP as Rx listed as historical med

## 2022-11-01 NOTE — Telephone Encounter (Signed)
Rx done. 

## 2022-11-22 DIAGNOSIS — M7061 Trochanteric bursitis, right hip: Secondary | ICD-10-CM | POA: Diagnosis not present

## 2022-11-22 DIAGNOSIS — M7062 Trochanteric bursitis, left hip: Secondary | ICD-10-CM | POA: Diagnosis not present

## 2022-12-09 ENCOUNTER — Encounter (HOSPITAL_BASED_OUTPATIENT_CLINIC_OR_DEPARTMENT_OTHER): Payer: Self-pay | Admitting: Emergency Medicine

## 2022-12-09 ENCOUNTER — Inpatient Hospital Stay (HOSPITAL_BASED_OUTPATIENT_CLINIC_OR_DEPARTMENT_OTHER)
Admission: EM | Admit: 2022-12-09 | Discharge: 2022-12-12 | DRG: 690 | Disposition: A | Discharge status: Home / Self Care | Payer: Medicare Other | Attending: Internal Medicine | Admitting: Internal Medicine

## 2022-12-09 ENCOUNTER — Emergency Department (HOSPITAL_BASED_OUTPATIENT_CLINIC_OR_DEPARTMENT_OTHER): Payer: Medicare Other

## 2022-12-09 ENCOUNTER — Other Ambulatory Visit: Payer: Self-pay

## 2022-12-09 DIAGNOSIS — Z9109 Other allergy status, other than to drugs and biological substances: Secondary | ICD-10-CM

## 2022-12-09 DIAGNOSIS — Z96642 Presence of left artificial hip joint: Secondary | ICD-10-CM | POA: Diagnosis not present

## 2022-12-09 DIAGNOSIS — N136 Pyonephrosis: Secondary | ICD-10-CM | POA: Diagnosis not present

## 2022-12-09 DIAGNOSIS — G43909 Migraine, unspecified, not intractable, without status migrainosus: Secondary | ICD-10-CM | POA: Diagnosis not present

## 2022-12-09 DIAGNOSIS — N12 Tubulo-interstitial nephritis, not specified as acute or chronic: Secondary | ICD-10-CM | POA: Diagnosis not present

## 2022-12-09 DIAGNOSIS — Z885 Allergy status to narcotic agent status: Secondary | ICD-10-CM

## 2022-12-09 DIAGNOSIS — Z981 Arthrodesis status: Secondary | ICD-10-CM

## 2022-12-09 DIAGNOSIS — N10 Acute tubulo-interstitial nephritis: Secondary | ICD-10-CM | POA: Diagnosis not present

## 2022-12-09 DIAGNOSIS — F419 Anxiety disorder, unspecified: Secondary | ICD-10-CM | POA: Diagnosis present

## 2022-12-09 DIAGNOSIS — Z801 Family history of malignant neoplasm of trachea, bronchus and lung: Secondary | ICD-10-CM

## 2022-12-09 DIAGNOSIS — K219 Gastro-esophageal reflux disease without esophagitis: Secondary | ICD-10-CM | POA: Diagnosis not present

## 2022-12-09 DIAGNOSIS — B9620 Unspecified Escherichia coli [E. coli] as the cause of diseases classified elsewhere: Secondary | ICD-10-CM | POA: Diagnosis present

## 2022-12-09 DIAGNOSIS — D72829 Elevated white blood cell count, unspecified: Secondary | ICD-10-CM

## 2022-12-09 DIAGNOSIS — F32A Depression, unspecified: Secondary | ICD-10-CM

## 2022-12-09 DIAGNOSIS — R1031 Right lower quadrant pain: Secondary | ICD-10-CM | POA: Diagnosis not present

## 2022-12-09 DIAGNOSIS — E876 Hypokalemia: Secondary | ICD-10-CM | POA: Diagnosis not present

## 2022-12-09 DIAGNOSIS — N135 Crossing vessel and stricture of ureter without hydronephrosis: Secondary | ICD-10-CM

## 2022-12-09 DIAGNOSIS — Z87891 Personal history of nicotine dependence: Secondary | ICD-10-CM | POA: Diagnosis not present

## 2022-12-09 DIAGNOSIS — Z90710 Acquired absence of both cervix and uterus: Secondary | ICD-10-CM | POA: Diagnosis not present

## 2022-12-09 DIAGNOSIS — Z803 Family history of malignant neoplasm of breast: Secondary | ICD-10-CM

## 2022-12-09 DIAGNOSIS — Z79899 Other long term (current) drug therapy: Secondary | ICD-10-CM

## 2022-12-09 DIAGNOSIS — R112 Nausea with vomiting, unspecified: Secondary | ICD-10-CM | POA: Diagnosis not present

## 2022-12-09 DIAGNOSIS — K838 Other specified diseases of biliary tract: Secondary | ICD-10-CM | POA: Diagnosis not present

## 2022-12-09 LAB — CBC
HCT: 39.5 % (ref 36.0–46.0)
Hemoglobin: 13 g/dL (ref 12.0–15.0)
MCH: 29.9 pg (ref 26.0–34.0)
MCHC: 32.9 g/dL (ref 30.0–36.0)
MCV: 90.8 fL (ref 80.0–100.0)
Platelets: 225 10*3/uL (ref 150–400)
RBC: 4.35 MIL/uL (ref 3.87–5.11)
RDW: 13.1 % (ref 11.5–15.5)
WBC: 16.1 10*3/uL — ABNORMAL HIGH (ref 4.0–10.5)
nRBC: 0 % (ref 0.0–0.2)

## 2022-12-09 LAB — CBC WITH DIFFERENTIAL/PLATELET
Abs Immature Granulocytes: 0.07 10*3/uL (ref 0.00–0.07)
Basophils Absolute: 0.1 10*3/uL (ref 0.0–0.1)
Basophils Relative: 0 %
Eosinophils Absolute: 0 10*3/uL (ref 0.0–0.5)
Eosinophils Relative: 0 %
HCT: 39.6 % (ref 36.0–46.0)
Hemoglobin: 13.4 g/dL (ref 12.0–15.0)
Immature Granulocytes: 0 %
Lymphocytes Relative: 7 %
Lymphs Abs: 1.2 10*3/uL (ref 0.7–4.0)
MCH: 30 pg (ref 26.0–34.0)
MCHC: 33.8 g/dL (ref 30.0–36.0)
MCV: 88.6 fL (ref 80.0–100.0)
Monocytes Absolute: 1.8 10*3/uL — ABNORMAL HIGH (ref 0.1–1.0)
Monocytes Relative: 10 %
Neutro Abs: 15.1 10*3/uL — ABNORMAL HIGH (ref 1.7–7.7)
Neutrophils Relative %: 83 %
Platelets: 237 10*3/uL (ref 150–400)
RBC: 4.47 MIL/uL (ref 3.87–5.11)
RDW: 13 % (ref 11.5–15.5)
WBC: 18.3 10*3/uL — ABNORMAL HIGH (ref 4.0–10.5)
nRBC: 0 % (ref 0.0–0.2)

## 2022-12-09 LAB — COMPREHENSIVE METABOLIC PANEL
ALT: 12 U/L (ref 0–44)
AST: 20 U/L (ref 15–41)
Albumin: 4.1 g/dL (ref 3.5–5.0)
Alkaline Phosphatase: 33 U/L — ABNORMAL LOW (ref 38–126)
Anion gap: 6 (ref 5–15)
BUN: 20 mg/dL (ref 8–23)
CO2: 25 mmol/L (ref 22–32)
Calcium: 8.9 mg/dL (ref 8.9–10.3)
Chloride: 103 mmol/L (ref 98–111)
Creatinine, Ser: 0.94 mg/dL (ref 0.44–1.00)
GFR, Estimated: >60 mL/min (ref >60)
Glucose, Bld: 118 mg/dL — ABNORMAL HIGH (ref 70–99)
Potassium: 3.4 mmol/L — ABNORMAL LOW (ref 3.5–5.1)
Sodium: 134 mmol/L — ABNORMAL LOW (ref 135–145)
Total Bilirubin: 1.3 mg/dL — ABNORMAL HIGH (ref 0.3–1.2)
Total Protein: 6.9 g/dL (ref 6.5–8.1)

## 2022-12-09 LAB — URINALYSIS, W/ REFLEX TO CULTURE (INFECTION SUSPECTED)
Bilirubin Urine: NEGATIVE
Glucose, UA: NEGATIVE mg/dL
Ketones, ur: NEGATIVE mg/dL
Nitrite: NEGATIVE
Protein, ur: >=300 mg/dL — AB
Specific Gravity, Urine: 1.02 (ref 1.005–1.030)
WBC, UA: >50 WBC/hpf (ref 0–5)
pH: 6 (ref 5.0–8.0)

## 2022-12-09 LAB — LACTIC ACID, PLASMA: Lactic Acid, Venous: 1.4 mmol/L (ref 0.5–1.9)

## 2022-12-09 LAB — CREATININE, SERUM
Creatinine, Ser: 0.79 mg/dL (ref 0.44–1.00)
GFR, Estimated: >60 mL/min (ref >60)

## 2022-12-09 LAB — LIPASE, BLOOD: Lipase: 29 U/L (ref 11–51)

## 2022-12-09 MED ORDER — FENTANYL CITRATE PF 50 MCG/ML IJ SOSY
100.0000 ug | PREFILLED_SYRINGE | Freq: Once | INTRAMUSCULAR | Status: AC
  Administered 2022-12-09: 100 ug via INTRAVENOUS
  Filled 2022-12-09: qty 2

## 2022-12-09 MED ORDER — LACTATED RINGERS IV BOLUS
500.0000 mL | Freq: Once | INTRAVENOUS | Status: AC
  Administered 2022-12-09: 500 mL via INTRAVENOUS

## 2022-12-09 MED ORDER — PANTOPRAZOLE SODIUM 40 MG PO TBEC
40.0000 mg | DELAYED_RELEASE_TABLET | Freq: Every day | ORAL | Status: DC
  Administered 2022-12-09 – 2022-12-12 (×4): 40 mg via ORAL
  Filled 2022-12-09 (×4): qty 1

## 2022-12-09 MED ORDER — LACTATED RINGERS IV BOLUS
1000.0000 mL | Freq: Once | INTRAVENOUS | Status: AC
  Administered 2022-12-09: 1000 mL via INTRAVENOUS

## 2022-12-09 MED ORDER — FENTANYL CITRATE PF 50 MCG/ML IJ SOSY
50.0000 ug | PREFILLED_SYRINGE | Freq: Once | INTRAMUSCULAR | Status: AC
  Administered 2022-12-09: 50 ug via INTRAVENOUS
  Filled 2022-12-09: qty 1

## 2022-12-09 MED ORDER — CITALOPRAM HYDROBROMIDE 20 MG PO TABS
40.0000 mg | ORAL_TABLET | Freq: Every day | ORAL | Status: DC
  Administered 2022-12-09 – 2022-12-12 (×4): 40 mg via ORAL
  Filled 2022-12-09 (×4): qty 2

## 2022-12-09 MED ORDER — BUPROPION HCL ER (XL) 150 MG PO TB24
150.0000 mg | ORAL_TABLET | Freq: Every day | ORAL | Status: DC
  Administered 2022-12-09 – 2022-12-11 (×2): 150 mg via ORAL
  Filled 2022-12-09 (×4): qty 1

## 2022-12-09 MED ORDER — SODIUM CHLORIDE 0.9 % IV SOLN
2.0000 g | Freq: Once | INTRAVENOUS | Status: AC
  Administered 2022-12-09: 2 g via INTRAVENOUS
  Filled 2022-12-09: qty 20

## 2022-12-09 MED ORDER — ENOXAPARIN SODIUM 40 MG/0.4ML IJ SOSY
40.0000 mg | PREFILLED_SYRINGE | INTRAMUSCULAR | Status: DC
  Administered 2022-12-09 – 2022-12-11 (×3): 40 mg via SUBCUTANEOUS
  Filled 2022-12-09 (×3): qty 0.4

## 2022-12-09 MED ORDER — ZOLPIDEM TARTRATE 5 MG PO TABS
5.0000 mg | ORAL_TABLET | Freq: Every evening | ORAL | Status: DC | PRN
  Administered 2022-12-10 – 2022-12-11 (×2): 5 mg via ORAL
  Filled 2022-12-09 (×2): qty 1

## 2022-12-09 MED ORDER — ONDANSETRON HCL 4 MG/2ML IJ SOLN
4.0000 mg | Freq: Once | INTRAMUSCULAR | Status: AC
  Administered 2022-12-09: 4 mg via INTRAVENOUS
  Filled 2022-12-09: qty 2

## 2022-12-09 MED ORDER — ACETAMINOPHEN 650 MG RE SUPP: 650.0000 mg | Freq: Four times a day (QID) | RECTAL | Status: DC | PRN

## 2022-12-09 MED ORDER — IOHEXOL 300 MG/ML  SOLN
80.0000 mL | Freq: Once | INTRAMUSCULAR | Status: AC | PRN
  Administered 2022-12-09: 80 mL via INTRAVENOUS

## 2022-12-09 MED ORDER — SODIUM CHLORIDE 0.9 % IV SOLN
2.0000 g | INTRAVENOUS | Status: DC
  Administered 2022-12-10 – 2022-12-11 (×2): 2 g via INTRAVENOUS
  Filled 2022-12-09 (×2): qty 20

## 2022-12-09 MED ORDER — SODIUM CHLORIDE 0.9 % IV SOLN: INTRAVENOUS | Status: DC

## 2022-12-09 MED ORDER — OXYCODONE HCL 5 MG PO TABS: 5.0000 mg | ORAL_TABLET | ORAL | Status: DC | PRN

## 2022-12-09 MED ORDER — ACETAMINOPHEN 325 MG PO TABS: 650.0000 mg | ORAL_TABLET | Freq: Four times a day (QID) | ORAL | Status: DC | PRN

## 2022-12-09 MED ORDER — POTASSIUM CHLORIDE 20 MEQ PO PACK
40.0000 meq | PACK | Freq: Once | ORAL | Status: AC
  Administered 2022-12-09: 40 meq via ORAL
  Filled 2022-12-09: qty 2

## 2022-12-09 NOTE — Progress Notes (Signed)
Plan of Care Note for accepted transfer   Patient: Kim Daniel MRN: [id number]   [place]: 12/09/2022  Facility requesting transfer: [facility] Requesting Provider: Dr. Tyrone Nine Reason for transfer: chronic UPJ obstruction with acute right sided pyelonephritis  Facility course: 77 year old with history of anxiety and depression, GERD, migraines who presented to ED with abdominal pain and associated nausea and one episode of vomiting. She also had some right sided flank pain.   Vitals: stable Pertinent labs: wbc: 18.3, UA: moderate leuk, +hgb, many bacteria  CT abdomen/pelvis: 1. Persistent marked dilatation of the RIGHT renal pelvis with mild enhancement of the uroepithelium. Findings consistent with chronic UPJ obstruction. However, the mucosal enhancement suggest potential superimposed infection of the RIGHT renal collecting system. 2. Moderate extrahepatic biliary duct dilatation similar to comparison exam. Recommend correlation with bilirubin level. If elevated, MRCP may be warranted. 3. No evidence of bowel obstruction or inflammation. 4. Appendix not identified but no pericecal inflammation to suggest appendicitis.  In Ed: urology consulted. Given rocephin. At first urology thought okay to send home, but with leukocytosis, soft bp and just feeling bad decided to keep for IV abx/fluid.  EDP sent urology letting them know admitting/plan.   Plan of care: The patient is accepted for admission to [facility]  unit, at [facility]..  Or [facility]   Author: Orma Flaming, MD 12/09/2022  Check [email or web address] for on-call coverage.  Nursing staff, Please call [place] number on Amion as soon as patient's arrival, so appropriate admitting provider can evaluate the pt.

## 2022-12-09 NOTE — H&P (Signed)
History and Physical    Kim Daniel [id number] DOB: Sep 22, 1946 DOA: 12/09/2022  PCP: Eulas Post, MD   Chief Complaint: abd pain  HPI: Kim Daniel is a 77 y.o. female with medical history significant of anxiety, depression, GERD, ureteral obstruction who presented to the emergency department with abdominal pain, nausea, vomiting.  Patient was endorsing right-sided abdominal pain for the last 24 hours.  Also had some dysuria and concern for development of UTI.  She presented to the ER where she was found to be afebrile and hemodynamically stable.  Labs were obtained which notable for sodium 134, potassium 3.4, WBC 18.3, urinalysis concerning for infection.  Creatinine 0.9.  Urine cultures were obtained however pending.  Lactic acid 1.9.  Patient underwent CT abdomen/pelvis which demonstrated persistent dilation of the right renal pelvis consistent with chronic ureteropelvic junction obstruction.  Was also concern for superimposed infection.  CT did also note intrahepatic biliary ductal dilation with recommended outpatient follow-up.  Patient's bilirubin was 1.3.  Patient was given IV ceftriaxone and fluids and admitted for observation.  Since admission patient's symptoms have been well-controlled she has remained afebrile and hemodynamically stable.  Urology was Contacted and recommended observation with IV fluids and antibiotics.   Review of Systems: Review of Systems  All other systems reviewed and are negative.    As per HPI otherwise 10 point review of systems negative.   Allergies  Allergen Reactions   Codeine Other (See Comments)   Hydrocodone Nausea And Vomiting and Other (See Comments)    "get dizzy and spaced out" Other reaction(s): Other (See Comments) "get dizzy and spaced out"    Past Medical History:  Diagnosis Date   Anxiety    Anxiety and depression    Chronic constipation    Depression    GERD (gastroesophageal reflux disease)    History of  hiatal hernia    History of migraine headaches    Seasonal allergic rhinitis    SUI (stress urinary incontinence, female)    Ureteral obstruction, right    Wears contact lenses     Past Surgical History:  Procedure Laterality Date   ANTERIOR CERVICAL DECOMP/DISCECTOMY FUSION  03-10-2004   C4 -- C6   BREAST BIOPSY Right 1980   BREAST EXCISIONAL BIOPSY Right 1980   COLONOSCOPY WITH PROPOFOL  03-08-2009   CYSTOSCOPY W/ URETERAL STENT PLACEMENT Right 10/02/2014   Procedure: CYSTOSCOPY WITH RIGHT RETROGRADE PYELOGRAM , URETERAL STENT PLACEMENT;  Surgeon: Claybon Jabs, MD;  Location: [facility];  Service: Urology;  Laterality: Right;   ESOPHAGOGASTRODUODENOSCOPY  04-02-2002   TOTAL HIP ARTHROPLASTY  03/03/2012   Procedure: TOTAL HIP ARTHROPLASTY;  Surgeon: Mauri Pole, MD;  Location: [facility];  Service: Orthopedics;  Laterality: Left;   [name]     reports that she quit smoking about 23 years ago. Her smoking use included cigarettes. She has a 10.00 pack-year smoking history. She has never used smokeless tobacco. She reports current alcohol use of about 1.0 standard drink of alcohol per week. She reports that she does not use drugs.  Family History  Problem Relation Age of Onset   Cirrhosis Mother        non alcohol cirrhosis   Cancer Father 75       lung cancer   Breast cancer Maternal Uncle     Prior to Admission medications   Medication Sig Start Date End Date Taking? Authorizing Provider  buPROPion (  WELLBUTRIN XL) 150 MG 24 hr tablet TAKE 1 TABLET BY MOUTH ONCE A DAY 10/27/22   Burchette, Alinda Sierras, MD  Calcium Carbonate-Vitamin D (CALTRATE 600+D PO) Take 1 tablet by mouth 3 (three) times a week.    [provider]  citalopram (CELEXA) 40 MG tablet TAKE 1 TABLET BY MOUTH ONCE DAILY 11/01/22   Burchette, Alinda Sierras, MD  clonazePAM (KLONOPIN) 0.5 MG tablet Take 1 tablet by mouth daily as needed. 03/15/22   [provider]  clotrimazole-betamethasone (LOTRISONE) cream Apply one application to skin twice per day as needed for skin irritation. 01/13/16   Burchette, Alinda Sierras, MD  diclofenac (VOLTAREN) 75 MG EC tablet 1 tablet as needed 05/03/21   [provider]  fluticasone (FLONASE) 50 MCG/ACT nasal spray PLACE 2 SPRAYS INTO BOTH NOSTRILS DAILY 06/17/19   Burchette, Alinda Sierras, MD  linaclotide (LINZESS) 290 MCG CAPS capsule Take 1 capsule (290 mcg total) by mouth every morning. 05/17/22   Burchette, Alinda Sierras, MD  metoprolol tartrate (LOPRESSOR) 50 MG tablet Take 1 tablet by mouth once for procedure. 12/05/19   O'Neal, Cassie Freer, MD  naproxen (NAPROSYN) 375 MG tablet     [provider]  pantoprazole (PROTONIX) 40 MG tablet TAKE 1 TABLET BY MOUTH ONCE DAILY 07/24/22   Burchette, Alinda Sierras, MD  phenazopyridine (PYRIDIUM) 200 MG tablet Take 1 tablet (200 mg total) by mouth 3 (three) times daily as needed for pain. 10/02/14   Kathie Rhodes, MD  SUMAtriptan (IMITREX) 100 MG tablet TAKE 1 TABLET BY MOUTH AT ONSET OF MIGRAINE. MAY REPEAT DOSE IN 1 HOUR IF HEADACHE NOT RESOLVED 10/13/19   Burchette, Alinda Sierras, MD  triamcinolone cream (KENALOG) 0.1 % APPLY A THIN LAYER TO THE AFFECTED AREA(S) BY TOPICAL ROUTE 2 TIMES PER DAY 02/15/22   [provider]  zolpidem (AMBIEN) 5 MG tablet TAKE 1 TABLET BY MOUTH AT BEDTIME AS NEEDED FOR SLEEP. 07/24/22   Eulas Post, MD    Physical Exam: Vitals:   12/09/22 1630 12/09/22 1815 12/09/22 1900 12/09/22 2047  BP: 130/62 110/80 102/74 98/63  Pulse: 67 75 72 80  Resp: 18 15 16 16  $ Temp:    98.2 F (36.8 C)  TempSrc:    Oral  SpO2: 97% 97% 97% 92%  Weight:    60.3 kg  Height:    5' 4"$  (1.626 m)   Physical Exam Vitals reviewed.  Constitutional:      Appearance: She is normal weight.  HENT:     Head: Normocephalic.  Cardiovascular:     Rate and Rhythm: Normal rate and regular rhythm.  Pulmonary:     Effort: Pulmonary effort is normal.      Breath sounds: Normal breath sounds.  Abdominal:     General: Abdomen is flat. Bowel sounds are normal.     Palpations: Abdomen is soft.     Tenderness: There is abdominal tenderness in the right lower quadrant.  Skin:    General: Skin is warm.     Capillary Refill: Capillary refill takes less than 2 seconds.  Neurological:     General: No focal deficit present.     Mental Status: She is alert.       Labs on Admission: I have personally reviewed the patients's labs and imaging studies.  Assessment/Plan Principal Problem:   Pyelonephritis of right kidney Active Problems:   Pyelonephritis   # Chronic UPJ obstruction complicated by acute pyelonephritis, POA, active - Patient has urinalysis concerning for  infection with abdominal pain - Creatinine at baseline Plan: Continue IV fluids Continue ceftriaxone Likely can transition to p.o. ciprofloxacin at discharge  Depression- continue bupropion, celexa  GERd- continue PPI  Extrahepatic biliary ductal dilation-  Moderate extrahepatic biliary duct dilatation similar to comparison exam. Recommend correlation with bilirubin level. If elevated, MRCP may be warranted.   Admission status: Observation Med-Surg  Certification: The appropriate patient status for this patient is OBSERVATION. Observation status is judged to be reasonable and necessary in order to provide the required intensity of service to ensure the patient's safety. The patient's presenting symptoms, physical exam findings, and initial radiographic and laboratory data in the context of their medical condition is felt to place them at decreased risk for further clinical deterioration. Furthermore, it is anticipated that the patient will be medically stable for discharge from the hospital within 2 midnights of admission.     Emilee Hero MD [facility] If 7PM-7AM, please contact night-coverage [email or web address]  12/09/2022, 8:57 PM  D  GERD-continue  pantoprazoleBupropion, Celexaepression-continue

## 2022-12-09 NOTE — ED Provider Notes (Signed)
[facility] Provider Note   CSN: [id number] Arrival date & time: 12/09/22  1114     History  Chief Complaint  Patient presents with   Abdominal Pain    Kim Daniel is a 77 y.o. female.  HPI     77 year old female with a history of depression, anxiety, migraines, trochanteric bursitis, appendectomy, tubal ligation and hysterectomy who presents with concern for abdominal pain.  Reports right-sided severe aching pain that feels like a ball in her right middle abdomen without radiation.  The pain is severe, 8 out of 10.  She has associated nausea and vomited once.  Reports midmorning she also noted some urinary symptoms like she was getting a UTI.  Denies dysuria, but had some irritation.  Denies history of kidney stones, no known history of gallstones.  She tried a suppository thinking that her abdominal pain could be constipation, reports it felt differently and she has not felt constipated recently.  No diarrhea.  No known fevers or chills. Past Medical History:  Diagnosis Date   Anxiety    Anxiety and depression    Chronic constipation    Depression    GERD (gastroesophageal reflux disease)    History of hiatal hernia    History of migraine headaches    Seasonal allergic rhinitis    SUI (stress urinary incontinence, female)    Ureteral obstruction, right    Wears contact lenses     Past Surgical History:  Procedure Laterality Date   ANTERIOR CERVICAL DECOMP/DISCECTOMY FUSION  03-10-2004   C4 -- C6   BREAST BIOPSY Right 1980   BREAST EXCISIONAL BIOPSY Right 1980   COLONOSCOPY WITH PROPOFOL  03-08-2009   CYSTOSCOPY W/ URETERAL STENT PLACEMENT Right 10/02/2014   Procedure: CYSTOSCOPY WITH RIGHT RETROGRADE PYELOGRAM , URETERAL STENT PLACEMENT;  Surgeon: Claybon Jabs, MD;  Location: [facility];  Service: Urology;  Laterality: Right;   ESOPHAGOGASTRODUODENOSCOPY  04-02-2002   TOTAL HIP ARTHROPLASTY   03/03/2012   Procedure: TOTAL HIP ARTHROPLASTY;  Surgeon: Mauri Pole, MD;  Location: [facility];  Service: Orthopedics;  Laterality: Left;   [place] Medications Prior to Admission medications   Medication Sig Start Date End Date Taking? Authorizing Provider  buPROPion (WELLBUTRIN XL) 150 MG 24 hr tablet TAKE 1 TABLET BY MOUTH ONCE A DAY 10/27/22   Burchette, Alinda Sierras, MD  Calcium Carbonate-Vitamin D (CALTRATE 600+D PO) Take 1 tablet by mouth 3 (three) times a week.    [provider]  citalopram (CELEXA) 40 MG tablet TAKE 1 TABLET BY MOUTH ONCE DAILY 11/01/22   Burchette, Alinda Sierras, MD  clonazePAM (KLONOPIN) 0.5 MG tablet Take 1 tablet by mouth daily as needed. 03/15/22   [provider]  clotrimazole-betamethasone (LOTRISONE) cream Apply one application to skin twice per day as needed for skin irritation. 01/13/16   Burchette, Alinda Sierras, MD  diclofenac (VOLTAREN) 75 MG EC tablet 1 tablet as needed 05/03/21   [provider]  fluticasone (FLONASE) 50 MCG/ACT nasal spray PLACE 2 SPRAYS INTO BOTH NOSTRILS DAILY 06/17/19   Burchette, Alinda Sierras, MD  linaclotide (LINZESS) 290 MCG CAPS capsule Take 1 capsule (290 mcg total) by mouth every morning. 05/17/22   Burchette, Alinda Sierras, MD  metoprolol tartrate (LOPRESSOR) 50 MG tablet Take 1 tablet by mouth once for procedure. 12/05/19   Geralynn Rile, MD  naproxen (NAPROSYN) 375 MG tablet  [provider]  pantoprazole (PROTONIX) 40 MG tablet TAKE 1 TABLET BY MOUTH ONCE DAILY 07/24/22   Burchette, Alinda Sierras, MD  phenazopyridine (PYRIDIUM) 200 MG tablet Take 1 tablet (200 mg total) by mouth 3 (three) times daily as needed for pain. 10/02/14   Kathie Rhodes, MD  SUMAtriptan (IMITREX) 100 MG tablet TAKE 1 TABLET BY MOUTH AT ONSET OF MIGRAINE. MAY REPEAT DOSE IN 1 HOUR IF HEADACHE NOT RESOLVED 10/13/19   Burchette, Alinda Sierras, MD  triamcinolone cream (KENALOG) 0.1 % APPLY A THIN LAYER TO  THE AFFECTED AREA(S) BY TOPICAL ROUTE 2 TIMES PER DAY 02/15/22   [provider]  zolpidem (AMBIEN) 5 MG tablet TAKE 1 TABLET BY MOUTH AT BEDTIME AS NEEDED FOR SLEEP. 07/24/22   Burchette, Alinda Sierras, MD      Allergies    Codeine and Hydrocodone    Review of Systems   Review of Systems  Physical Exam Updated Vital Signs BP 104/67   Pulse 72   Temp 97.7 F (36.5 C)   Resp 15   Wt 61.2 kg   SpO2 97%   BMI 23.17 kg/m  Physical Exam Vitals and nursing note reviewed.  Constitutional:      General: She is not in acute distress.    Appearance: She is well-developed. She is not diaphoretic.  HENT:     Head: Normocephalic and atraumatic.  Eyes:     Conjunctiva/sclera: Conjunctivae normal.  Cardiovascular:     Rate and Rhythm: Normal rate and regular rhythm.     Heart sounds: Normal heart sounds. No murmur heard.    No friction rub. No gallop.  Pulmonary:     Effort: Pulmonary effort is normal. No respiratory distress.     Breath sounds: Normal breath sounds. No wheezing or rales.  Abdominal:     General: There is no distension.     Palpations: Abdomen is soft.     Tenderness: There is abdominal tenderness in the right upper quadrant and right lower quadrant. There is no guarding. Positive signs include Murphy's sign.  Musculoskeletal:        General: No tenderness.     Cervical back: Normal range of motion.  Skin:    General: Skin is warm and dry.     Findings: No erythema or rash.  Neurological:     Mental Status: She is alert and oriented to person, place, and time.     ED Results / Procedures / Treatments   Labs (all labs ordered are listed, but only abnormal results are displayed) Labs Reviewed  COMPREHENSIVE METABOLIC PANEL - Abnormal; Notable for the following components:      Result Value   Sodium 134 (*)    Potassium 3.4 (*)    Glucose, Bld 118 (*)    Alkaline Phosphatase 33 (*)    Total Bilirubin 1.3 (*)    All other components within normal limits   CBC WITH DIFFERENTIAL/PLATELET - Abnormal; Notable for the following components:   WBC 18.3 (*)    Neutro Abs 15.1 (*)    Monocytes Absolute 1.8 (*)    All other components within normal limits  URINALYSIS, W/ REFLEX TO CULTURE (INFECTION SUSPECTED) - Abnormal; Notable for the following components:   APPearance TURBID (*)    Hgb urine dipstick LARGE (*)    Protein, ur >=300 (*)    Leukocytes,Ua MODERATE (*)    Bacteria, UA MANY (*)    All other components within normal limits  URINE CULTURE  CULTURE, BLOOD (ROUTINE X 2)  CULTURE, BLOOD (ROUTINE X 2)  LIPASE, BLOOD  LACTIC ACID, PLASMA  LACTIC ACID, PLASMA    EKG None  Radiology CT ABDOMEN PELVIS W CONTRAST  Result Date: 12/09/2022 CLINICAL DATA:  Bowel obstruction suspected. History of cholecystitis and pyelonephritis. RIGHT lower quadrant pain and dysuria. EXAM: CT ABDOMEN AND PELVIS WITH CONTRAST TECHNIQUE: Multidetector CT imaging of the abdomen and pelvis was performed using the standard protocol following bolus administration of intravenous contrast. RADIATION DOSE REDUCTION: This exam was performed according to the departmental dose-optimization program which includes automated exposure control, adjustment of the mA and/or kV according to patient size and/or use of iterative reconstruction technique. CONTRAST:  51m OMNIPAQUE IOHEXOL 300 MG/ML  SOLN COMPARISON:  CT 05/26/2018, nuclear medicine renal scan 06/12/2018 FINDINGS: Lower chest: Lung bases are clear. Hepatobiliary: No focal hepatic lesion. Moderate extrahepatic biliary duct dilatation with the common bile duct measuring 9 mm. No obstructing lesion identified. No radiodense gallstones or radiodense common bile duct stones. Common bile duct measures 10 mm on comparison CT. Pancreas: Pancreas is normal. No ductal dilatation. No pancreatic inflammation. Spleen: Normal spleen Adrenals/urinary tract: Adrenal glands normal. Marked dilatation of the RIGHT renal pelvis similar to  comparison exam. There is mild enhancement of the uroepithelium of the RIGHT renal pelvis. Dilatation extends into the renal calices with mild enhancement (image 47/2 for example). No obstructing lesion identified. The distal RIGHT ureter is normal. LEFT kidney and bladder normal Stomach/Bowel: Stomach, small-bowel and cecum are normal. The appendix is not identified but there is no pericecal inflammation to suggest appendicitis. The colon and rectosigmoid colon are normal. Vascular/Lymphatic: Abdominal aorta is normal caliber. No periportal or retroperitoneal adenopathy. No pelvic adenopathy. Reproductive: Post hysterectomy.  Adnexa unremarkable Other: No free fluid. Musculoskeletal: No aggressive osseous lesion. IMPRESSION: 1. Persistent marked dilatation of the RIGHT renal pelvis with mild enhancement of the uroepithelium. Findings consistent with chronic UPJ obstruction. However, the mucosal enhancement suggest potential superimposed infection of the RIGHT renal collecting system. 2. Moderate extrahepatic biliary duct dilatation similar to comparison exam. Recommend correlation with bilirubin level. If elevated, MRCP may be warranted. 3. No evidence of bowel obstruction or inflammation. 4. Appendix not identified but no pericecal inflammation to suggest appendicitis. Electronically Signed   By: SSuzy BouchardM.D.   On: 12/09/2022 13:58    Procedures Procedures    Medications Ordered in ED Medications  lactated ringers bolus 500 mL (0 mLs Intravenous Stopped 12/09/22 1222)  ondansetron (ZOFRAN) injection 4 mg (4 mg Intravenous Given 12/09/22 1145)  fentaNYL (SUBLIMAZE) injection 50 mcg (50 mcg Intravenous Given 12/09/22 1146)  iohexol (OMNIPAQUE) 300 MG/ML solution 80 mL (80 mLs Intravenous Contrast Given 12/09/22 1316)  cefTRIAXone (ROCEPHIN) 2 g in sodium chloride 0.9 % 100 mL IVPB (2 g Intravenous New Bag/Given 12/09/22 1444)  lactated ringers bolus 1,000 mL (1,000 mLs Intravenous New Bag/Given  12/09/22 1524)    ED Course/ Medical Decision Making/ A&P                             Medical Decision Making Amount and/or Complexity of Data Reviewed Labs: ordered. Radiology: ordered.  Risk Prescription drug management.   77year old female with a history of depression, anxiety, migraines, trochanteric bursitis, appendectomy, tubal ligation and hysterectomy who presents with concern for abdominal pain.  DDx includes pancreatitis, cholecystitis, symptomatic cholelithiasis, pyelonephritis, nephrolithiasis, diverticulitis, SBO, AAA.  Labs completed and personally abided interpreted by me show transaminaseswithout  clinically significant abnormality, lipase, white blood cell count 18000.  Urinalysis shows signs of infection.  To evaluate for signs of nephrolithiasis, screen for gallbladder inflammation, or signs of pyonephritis, CT abdomen pelvis was ordered which showed persistent marked dilation of right renal pelvis with mild enhancement of uroepithelium consistent with infection.  Moderate extrahepatic biliary duct dilation, AST, ALT, alk phos within normal limits, bilirubin very mildly elevated at 1.3.  Given have other explanation for her right-sided pain with findings consistent with right-sided pyelonephritis, urinalysis consistent with infection, feel we can continue to observe her transaminases and bilirubin.  Discussed findings of chronic UPJ obstruction in the setting of infection with Dr. Alyson Ingles of urology.  Initially thought to be appropriate for outpatient follow-up with urology, however on reexamination she is noted to have some waxing and waning low blood pressures with numbers in the 90s.  Currently her blood pressure is 113/57.  Given age, significant leukocytosis, and concern for some blood pressure instability, will admit for observation for pyelonephritis.  Sent urology and message regarding this plan.  She was given Rocephin in the emergency department. Lactic acid  pending, ordered blood cx.          Final Clinical Impression(s) / ED Diagnoses Final diagnoses:  Pyelonephritis of right kidney  UPJ (ureteropelvic junction) obstruction    Rx / DC Orders ED Discharge Orders     None         Gareth Morgan, MD 12/09/22 1541

## 2022-12-09 NOTE — ED Triage Notes (Signed)
RLQ pain with dysuria this morning , HX UTIs, has no appendix she said , nausea and vomited today . Denies diarrhea

## 2022-12-09 NOTE — ED Notes (Signed)
Attempted report x1, no answer at RN station.

## 2022-12-09 NOTE — ED Notes (Signed)
Blood cultures x2 ordered and collected after first antibiotic administration.

## 2022-12-09 NOTE — ED Notes (Signed)
Called [facility] for transport at 5:37.

## 2022-12-09 NOTE — ED Notes (Signed)
Attempted report x2, receiving RN unable to take report at this time.

## 2022-12-09 NOTE — ED Notes (Signed)
Pt updated as to current bed assignment status. Call bell within reach, no concerns expressed.

## 2022-12-10 DIAGNOSIS — N135 Crossing vessel and stricture of ureter without hydronephrosis: Secondary | ICD-10-CM | POA: Diagnosis present

## 2022-12-10 DIAGNOSIS — B9620 Unspecified Escherichia coli [E. coli] as the cause of diseases classified elsewhere: Secondary | ICD-10-CM | POA: Diagnosis present

## 2022-12-10 DIAGNOSIS — K219 Gastro-esophageal reflux disease without esophagitis: Secondary | ICD-10-CM | POA: Diagnosis present

## 2022-12-10 DIAGNOSIS — Z9109 Other allergy status, other than to drugs and biological substances: Secondary | ICD-10-CM | POA: Diagnosis not present

## 2022-12-10 DIAGNOSIS — K838 Other specified diseases of biliary tract: Secondary | ICD-10-CM | POA: Diagnosis present

## 2022-12-10 DIAGNOSIS — N12 Tubulo-interstitial nephritis, not specified as acute or chronic: Secondary | ICD-10-CM | POA: Diagnosis not present

## 2022-12-10 DIAGNOSIS — Z981 Arthrodesis status: Secondary | ICD-10-CM | POA: Diagnosis not present

## 2022-12-10 DIAGNOSIS — E876 Hypokalemia: Secondary | ICD-10-CM | POA: Diagnosis not present

## 2022-12-10 DIAGNOSIS — Z79899 Other long term (current) drug therapy: Secondary | ICD-10-CM | POA: Diagnosis not present

## 2022-12-10 DIAGNOSIS — G43909 Migraine, unspecified, not intractable, without status migrainosus: Secondary | ICD-10-CM | POA: Diagnosis present

## 2022-12-10 DIAGNOSIS — Z885 Allergy status to narcotic agent status: Secondary | ICD-10-CM | POA: Diagnosis not present

## 2022-12-10 DIAGNOSIS — Z96642 Presence of left artificial hip joint: Secondary | ICD-10-CM | POA: Diagnosis present

## 2022-12-10 DIAGNOSIS — R112 Nausea with vomiting, unspecified: Secondary | ICD-10-CM | POA: Diagnosis present

## 2022-12-10 DIAGNOSIS — N136 Pyonephrosis: Secondary | ICD-10-CM | POA: Diagnosis present

## 2022-12-10 DIAGNOSIS — F32A Depression, unspecified: Secondary | ICD-10-CM

## 2022-12-10 DIAGNOSIS — Z90710 Acquired absence of both cervix and uterus: Secondary | ICD-10-CM | POA: Diagnosis not present

## 2022-12-10 DIAGNOSIS — Z87891 Personal history of nicotine dependence: Secondary | ICD-10-CM | POA: Diagnosis not present

## 2022-12-10 DIAGNOSIS — F419 Anxiety disorder, unspecified: Secondary | ICD-10-CM | POA: Diagnosis present

## 2022-12-10 DIAGNOSIS — Z801 Family history of malignant neoplasm of trachea, bronchus and lung: Secondary | ICD-10-CM | POA: Diagnosis not present

## 2022-12-10 DIAGNOSIS — D72829 Elevated white blood cell count, unspecified: Secondary | ICD-10-CM | POA: Diagnosis not present

## 2022-12-10 DIAGNOSIS — Z803 Family history of malignant neoplasm of breast: Secondary | ICD-10-CM | POA: Diagnosis not present

## 2022-12-10 LAB — BASIC METABOLIC PANEL
Anion gap: 7 (ref 5–15)
BUN: 15 mg/dL (ref 8–23)
CO2: 22 mmol/L (ref 22–32)
Calcium: 8.1 mg/dL — ABNORMAL LOW (ref 8.9–10.3)
Chloride: 109 mmol/L (ref 98–111)
Creatinine, Ser: 0.63 mg/dL (ref 0.44–1.00)
GFR, Estimated: >60 mL/min (ref >60)
Glucose, Bld: 109 mg/dL — ABNORMAL HIGH (ref 70–99)
Potassium: 3.6 mmol/L (ref 3.5–5.1)
Sodium: 138 mmol/L (ref 135–145)

## 2022-12-10 LAB — CBC
HCT: 35.4 % — ABNORMAL LOW (ref 36.0–46.0)
Hemoglobin: 11.8 g/dL — ABNORMAL LOW (ref 12.0–15.0)
MCH: 30.3 pg (ref 26.0–34.0)
MCHC: 33.3 g/dL (ref 30.0–36.0)
MCV: 91 fL (ref 80.0–100.0)
Platelets: 200 10*3/uL (ref 150–400)
RBC: 3.89 MIL/uL (ref 3.87–5.11)
RDW: 13.3 % (ref 11.5–15.5)
WBC: 10.4 10*3/uL (ref 4.0–10.5)
nRBC: 0 % (ref 0.0–0.2)

## 2022-12-10 LAB — MAGNESIUM: Magnesium: 2.4 mg/dL (ref 1.7–2.4)

## 2022-12-10 MED ORDER — PHENAZOPYRIDINE HCL 100 MG PO TABS
200.0000 mg | ORAL_TABLET | Freq: Three times a day (TID) | ORAL | Status: DC
  Administered 2022-12-10 – 2022-12-12 (×6): 200 mg via ORAL
  Filled 2022-12-10 (×6): qty 2

## 2022-12-10 MED ORDER — SUMATRIPTAN SUCCINATE 50 MG PO TABS: 100.0000 mg | ORAL_TABLET | ORAL | Status: DC | PRN

## 2022-12-10 MED ORDER — POTASSIUM CHLORIDE CRYS ER 10 MEQ PO TBCR
40.0000 meq | EXTENDED_RELEASE_TABLET | Freq: Once | ORAL | Status: AC
  Administered 2022-12-10: 40 meq via ORAL
  Filled 2022-12-10: qty 4

## 2022-12-10 MED ORDER — FLUTICASONE PROPIONATE 50 MCG/ACT NA SUSP: 2.0000 | Freq: Every day | NASAL | Status: DC

## 2022-12-10 MED ORDER — LINACLOTIDE 145 MCG PO CAPS
290.0000 ug | ORAL_CAPSULE | Freq: Every day | ORAL | Status: DC
  Administered 2022-12-11 – 2022-12-12 (×2): 290 ug via ORAL
  Filled 2022-12-10 (×3): qty 2

## 2022-12-10 MED ORDER — CLONAZEPAM 0.5 MG PO TABS
0.5000 mg | ORAL_TABLET | Freq: Every day | ORAL | Status: DC | PRN
  Administered 2022-12-10 – 2022-12-11 (×2): 0.5 mg via ORAL
  Filled 2022-12-10 (×2): qty 1

## 2022-12-10 NOTE — Progress Notes (Signed)
PROGRESS NOTE    Kim Daniel  [id number] DOB: 1946-02-28 DOA: 12/09/2022 PCP: Eulas Post, MD    Chief Complaint  Patient presents with   Abdominal Pain    Brief Narrative:  Patient 77 year old female history of depression anxiety GERD, ureteral obstruction presented to the ED with abdominal pain nausea vomiting as well as right-sided flank pain onset 24 hours prior to admission.  Patient also with some dysuria and concerns for developing UTI.  Patient seen in the ED noted to have a leukocytosis, urinalysis concerning for UTI.  Lactic acid level at 1.9.  CT abdomen and pelvis done with persistent dilation of the right renal pelvis consistent with chronic ureteropelvic junction obstruction.  Also concern for superimposed infection.  CT noted intrahepatic biliary ductal dilatation recommended outpatient follow-up.  Patient placed on IV antibiotics.  It is noted urology contacted and recommended observation with IV fluids and antibiotics.  Patient pancultured.   Assessment & Plan:   Principal Problem:   Pyelonephritis of right kidney Active Problems:   Pyelonephritis   UPJ (ureteropelvic junction) obstruction   Depression  #1 acute pyelonephritis of the right kidney/chronic UPJ obstruction complicated by acute pyelonephritis, POA -Patient admitted with nausea vomiting abdominal pain, right flank pain, urinalysis concerning for UTI. -CT abdomen and pelvis with persistent dilation of the right renal pelvis consistent with chronic ureteropelvic junction obstruction. -Urinalysis done concerning for UTI. -Urine cultures pending. -Blood cultures pending. -Patient improving clinically. -Continue empiric IV antibiotics. -Resume home regimen Pyridium. -Supportive care.  2.  GERD -PPI.  3.  Depression -Continue home regimen Wellbutrin, Celexa.  4.  Extrahepatic biliary ductal dilatation -Noted on CT scan that showed moderate extrahepatic biliary duct dilatation similar  to comparison exam.  Recommend correlation with bilirubin level and if elevated MRCP may be warranted. -Will need outpatient follow-up with PCP with repeat imaging.   DVT prophylaxis: Lovenox Code Status: Full Family Communication: Updated patient.  No family at bedside. Disposition: Likely home when clinically improved.  Status is: Inpatient The patient will require care spanning > 2 midnights and should be moved to inpatient because: Severity of illness   Consultants:  None  Procedures:  CT abdomen pelvis 12/09/2022   Antimicrobials:  IV Rocephin 12/09/2022 >>>>   Subjective: Patient sitting up in bed on the phone.  Overall she states she is feeling much better.  Right flank pain has improved.  No nausea or vomiting.  No abdominal pain.  Tolerating current oral intake.  Dysuria improving.  Objective: Vitals:   12/10/22 0151 12/10/22 0500 12/10/22 0933 12/10/22 1418  BP: 109/68 106/62 104/67 (!) 100/51  Pulse: 78 74 79 81  Resp: 16 18    Temp: 98.7 F (37.1 C) 98.6 F (37 C) 97.7 F (36.5 C)   TempSrc: Oral Oral Oral   SpO2: 94% 96% 96% 96%  Weight:      Height:        Intake/Output Summary (Last 24 hours) at 12/10/2022 1854 Last data filed at 12/10/2022 1738 Gross per 24 hour  Intake 1900.32 ml  Output 520 ml  Net 1380.32 ml   Filed Weights   12/09/22 1124 12/09/22 2047  Weight: 61.2 kg 60.3 kg    Examination:  General exam: NAD. Respiratory system: Clear to auscultation. Respiratory effort normal. Cardiovascular system: S1 & S2 heard, RRR. No JVD, murmurs, rubs, gallops or clicks. No pedal edema. Gastrointestinal system: Abdomen is nondistended, soft and nontender.  No significant tenderness to palpation in the right CVA region.  Positive bowel sounds.  No rebound.  No guarding.  Central nervous system: Alert and oriented. No focal neurological deficits. Extremities: Symmetric 5 x 5 power. Skin: No rashes, lesions or ulcers Psychiatry: Judgement and  insight appear normal. Mood & affect appropriate.     Data Reviewed: I have personally reviewed following labs and imaging studies  CBC: Recent Labs  Lab 12/09/22 1136 12/09/22 2118 12/10/22 0436  WBC 18.3* 16.1* 10.4  NEUTROABS 15.1*  --   --   HGB 13.4 13.0 11.8*  HCT 39.6 39.5 35.4*  MCV 88.6 90.8 91.0  PLT 237 225 [id number]    Basic Metabolic Panel: Recent Labs  Lab 12/09/22 1136 12/09/22 2118 12/10/22 0436  NA 134*  --  138  K 3.4*  --  3.6  CL 103  --  109  CO2 25  --  22  GLUCOSE 118*  --  109*  BUN 20  --  15  CREATININE 0.94 0.79 0.63  CALCIUM 8.9  --  8.1*  MG  --   --  2.4    GFR: Estimated Creatinine Clearance: 50.9 mL/min (by C-G formula based on SCr of 0.63 mg/dL).  Liver Function Tests: Recent Labs  Lab 12/09/22 1136  AST 20  ALT 12  ALKPHOS 33*  BILITOT 1.3*  PROT 6.9  ALBUMIN 4.1    CBG: No results for input(s): "GLUCAP" in the last 168 hours.   Recent Results (from the past 240 hour(s))  Urine Culture     Status: Abnormal (Preliminary result)   Collection Time: 12/09/22  1:42 PM   Specimen: Urine, Random  Result Value Ref Range Status   Specimen Description   Final    URINE, RANDOM Performed at [facility], [address] [place], [place] [address]    Special Requests URINE, CLEAN CATCH  Final   Culture (A)  Final    20,000 COLONIES/mL ESCHERICHIA COLI CULTURE REINCUBATED FOR BETTER GROWTH Performed at [facility], [place] [address], [place], [place] [address]    Report Status PENDING  Incomplete  Blood culture (routine x 2)     Status: None (Preliminary result)   Collection Time: 12/09/22  3:20 PM   Specimen: BLOOD RIGHT HAND  Result Value Ref Range Status   Specimen Description   Final    BLOOD RIGHT HAND Performed at [facility], [address] [place], [place] [address]    Special Requests   Final    BOTTLES DRAWN AEROBIC AND ANAEROBIC Blood Culture results may not be optimal due to an  inadequate volume of blood received in culture bottles Performed at [facility], [address] [place], [place] [address]    Culture   Final    NO GROWTH < 12 HOURS Performed at [facility], [place] [address] [place], [place] [address]    Report Status PENDING  Incomplete  Blood culture (routine x 2)     Status: None (Preliminary result)   Collection Time: 12/09/22  3:25 PM   Specimen: BLOOD  Result Value Ref Range Status   Specimen Description   Final    BLOOD BLOOD RIGHT FOREARM Performed at [facility], [address] [place], [place] [address]    Special Requests   Final    BOTTLES DRAWN AEROBIC AND ANAEROBIC Blood Culture results may not be optimal due to an inadequate volume of blood received in culture bottles Performed at [facility], [address] [place], [place] [address]    Culture   Final    NO GROWTH < 12 HOURS Performed at [facility] [address], [place], [place] [address]    Report Status PENDING  Incomplete         Radiology Studies: CT ABDOMEN PELVIS W CONTRAST  Result Date: 12/09/2022 CLINICAL DATA:  Bowel obstruction suspected. History of cholecystitis and pyelonephritis. RIGHT lower quadrant pain and dysuria. EXAM: CT ABDOMEN AND PELVIS WITH CONTRAST TECHNIQUE: Multidetector CT imaging of the abdomen and pelvis was performed using the standard protocol following bolus administration of intravenous contrast. RADIATION DOSE REDUCTION: This exam was performed according to the departmental dose-optimization program which includes automated exposure control, adjustment of the mA and/or kV according to patient size and/or use of iterative reconstruction technique. CONTRAST:  80m OMNIPAQUE IOHEXOL 300 MG/ML  SOLN COMPARISON:  CT 05/26/2018, nuclear medicine renal scan 06/12/2018 FINDINGS: Lower chest: Lung bases are clear. Hepatobiliary: No focal hepatic lesion. Moderate extrahepatic biliary duct dilatation  with the common bile duct measuring 9 mm. No obstructing lesion identified. No radiodense gallstones or radiodense common bile duct stones. Common bile duct measures 10 mm on comparison CT. Pancreas: Pancreas is normal. No ductal dilatation. No pancreatic inflammation. Spleen: Normal spleen Adrenals/urinary tract: Adrenal glands normal. Marked dilatation of the RIGHT renal pelvis similar to comparison exam. There is mild enhancement of the uroepithelium of the RIGHT renal pelvis. Dilatation extends into the renal calices with mild enhancement (image 47/2 for example). No obstructing lesion identified. The distal RIGHT ureter is normal. LEFT kidney and bladder normal Stomach/Bowel: Stomach, small-bowel and cecum are normal. The appendix is not identified but there is no pericecal inflammation to suggest appendicitis. The colon and rectosigmoid colon are normal. Vascular/Lymphatic: Abdominal aorta is normal caliber. No periportal or retroperitoneal adenopathy. No pelvic adenopathy. Reproductive: Post hysterectomy.  Adnexa unremarkable Other: No free fluid. Musculoskeletal: No aggressive osseous lesion. IMPRESSION: 1. Persistent marked dilatation of the RIGHT renal pelvis with mild enhancement of the uroepithelium. Findings consistent with chronic UPJ obstruction. However, the mucosal enhancement suggest potential superimposed infection of the RIGHT renal collecting system. 2. Moderate extrahepatic biliary duct dilatation similar to comparison exam. Recommend correlation with bilirubin level. If elevated, MRCP may be warranted. 3. No evidence of bowel obstruction or inflammation. 4. Appendix not identified but no pericecal inflammation to suggest appendicitis. Electronically Signed   By: SSuzy BouchardM.D.   On: 12/09/2022 13:58        Scheduled Meds:  buPROPion  150 mg Oral Daily   citalopram  40 mg Oral Daily   enoxaparin (LOVENOX) injection  40 mg Subcutaneous Q24H   linaclotide  290 mcg Oral QAC  breakfast   pantoprazole  40 mg Oral Daily   phenazopyridine  200 mg Oral TID WC   Continuous Infusions:  sodium chloride 100 mL/hr at 12/10/22 1732   cefTRIAXone (ROCEPHIN)  IV 2 g (12/10/22 1258)     LOS: 0 days    Time spent: 35 minutes    DIrine Seal MD [facility]   To contact the attending provider between 7A-7P or the covering provider during after hours 7P-7A, please log into the web site [email or web address] and access using universal [place] password for that web site. If you do not have the password, please call the hospital operator.  12/10/2022, 6:54 PM

## 2022-12-11 LAB — RENAL FUNCTION PANEL
Albumin: 3.2 g/dL — ABNORMAL LOW (ref 3.5–5.0)
Anion gap: 7 (ref 5–15)
BUN: 11 mg/dL (ref 8–23)
CO2: 22 mmol/L (ref 22–32)
Calcium: 8.1 mg/dL — ABNORMAL LOW (ref 8.9–10.3)
Chloride: 111 mmol/L (ref 98–111)
Creatinine, Ser: 0.72 mg/dL (ref 0.44–1.00)
GFR, Estimated: >60 mL/min (ref >60)
Glucose, Bld: 97 mg/dL (ref 70–99)
Phosphorus: 2.1 mg/dL — ABNORMAL LOW (ref 2.5–4.6)
Potassium: 3.9 mmol/L (ref 3.5–5.1)
Sodium: 140 mmol/L (ref 135–145)

## 2022-12-11 LAB — CBC WITH DIFFERENTIAL/PLATELET
Abs Immature Granulocytes: 0.02 10*3/uL (ref 0.00–0.07)
Basophils Absolute: 0.1 10*3/uL (ref 0.0–0.1)
Basophils Relative: 1 %
Eosinophils Absolute: 0.1 10*3/uL (ref 0.0–0.5)
Eosinophils Relative: 1 %
HCT: 36.7 % (ref 36.0–46.0)
Hemoglobin: 11.4 g/dL — ABNORMAL LOW (ref 12.0–15.0)
Immature Granulocytes: 0 %
Lymphocytes Relative: 30 %
Lymphs Abs: 2.1 10*3/uL (ref 0.7–4.0)
MCH: 29.7 pg (ref 26.0–34.0)
MCHC: 31.1 g/dL (ref 30.0–36.0)
MCV: 95.6 fL (ref 80.0–100.0)
Monocytes Absolute: 0.6 10*3/uL (ref 0.1–1.0)
Monocytes Relative: 9 %
Neutro Abs: 4.1 10*3/uL (ref 1.7–7.7)
Neutrophils Relative %: 59 %
Platelets: 175 10*3/uL (ref 150–400)
RBC: 3.84 MIL/uL — ABNORMAL LOW (ref 3.87–5.11)
RDW: 13.1 % (ref 11.5–15.5)
WBC: 7 10*3/uL (ref 4.0–10.5)
nRBC: 0 % (ref 0.0–0.2)

## 2022-12-11 LAB — MAGNESIUM: Magnesium: 2.2 mg/dL (ref 1.7–2.4)

## 2022-12-11 MED ORDER — K PHOS MONO-SOD PHOS DI & MONO 155-852-130 MG PO TABS
250.0000 mg | ORAL_TABLET | Freq: Two times a day (BID) | ORAL | Status: DC
  Administered 2022-12-11 – 2022-12-12 (×3): 250 mg via ORAL
  Filled 2022-12-11 (×3): qty 1

## 2022-12-11 NOTE — Progress Notes (Signed)
PROGRESS NOTE    Kim Daniel  [id number] DOB: April 29, 1946 DOA: 12/09/2022 PCP: Eulas Post, MD    Chief Complaint  Patient presents with   Abdominal Pain    Brief Narrative:  Patient 77 year old female history of depression anxiety GERD, ureteral obstruction presented to the ED with abdominal pain nausea vomiting as well as right-sided flank pain onset 24 hours prior to admission.  Patient also with some dysuria and concerns for developing UTI.  Patient seen in the ED noted to have a leukocytosis, urinalysis concerning for UTI.  Lactic acid level at 1.9.  CT abdomen and pelvis done with persistent dilation of the right renal pelvis consistent with chronic ureteropelvic junction obstruction.  Also concern for superimposed infection.  CT noted intrahepatic biliary ductal dilatation recommended outpatient follow-up.  Patient placed on IV antibiotics.  It is noted urology contacted and recommended observation with IV fluids and antibiotics.  Patient pancultured.   Assessment & Plan:   Principal Problem:   Pyelonephritis of right kidney Active Problems:   Pyelonephritis   UPJ (ureteropelvic junction) obstruction   Depression  #1 acute pyelonephritis of the right kidney/chronic UPJ obstruction complicated by acute pyelonephritis, POA -Patient admitted with nausea vomiting abdominal pain, right flank pain, urinalysis concerning for UTI. -CT abdomen and pelvis with persistent dilation of the right renal pelvis consistent with chronic ureteropelvic junction obstruction. -Urinalysis done concerning for UTI. -Urine cultures with 20,000 colonies of E. coli, susceptibilities pending. -Blood cultures pending with no growth to date. -Clinical improvement. -Continue empiric IV antibiotics. -Resume home regimen Pyridium. -Once urine cultures have finalized we will transition to oral antibiotics hopefully tomorrow. -Supportive care.  2.  GERD -PPI.  3.  Depression -Continue  Celexa, Wellbutrin.  4.  Extrahepatic biliary ductal dilatation -Noted on CT scan that showed moderate extrahepatic biliary duct dilatation similar to comparison exam.  Recommend correlation with bilirubin level and if elevated MRCP may be warranted. -Will need outpatient follow-up with PCP with repeat imaging.   DVT prophylaxis: Lovenox Code Status: Full Family Communication: Updated patient.  Updated daughter on speaker phone.   Disposition: Likely home once urine culture sensitivities finalized hopefully in the next 24 hours.  Status is: Inpatient The patient will require care spanning > 2 midnights and should be moved to inpatient because: Severity of illness   Consultants:  None  Procedures:  CT abdomen pelvis 12/09/2022   Antimicrobials:  IV Rocephin 12/09/2022 >>>>   Subjective: Sitting up in bed.  Overall feels much better than on admission.  Right flank pain improving.  No nausea or vomiting.  No chest pain.  No shortness of breath.  Dysuria improving daily.  Tolerating current diet.  Asking when she is going to be able to go home.   Objective: Vitals:   12/10/22 0933 12/10/22 1418 12/10/22 2025 12/11/22 0516  BP: 104/67 (!) 100/51 119/85 133/76  Pulse: 79 81 79 72  Resp:   18 18  Temp: 97.7 F (36.5 C)  99.2 F (37.3 C) 98.7 F (37.1 C)  TempSrc: Oral  Oral Oral  SpO2: 96% 96% 95% 94%  Weight:      Height:        Intake/Output Summary (Last 24 hours) at 12/11/2022 1156 Last data filed at 12/11/2022 1156 Gross per 24 hour  Intake 2063.14 ml  Output 2050 ml  Net 13.14 ml    Filed Weights   12/09/22 1124 12/09/22 2047  Weight: 61.2 kg 60.3 kg    Examination:  General  exam: NAD. Respiratory system: Lungs clear to auscultation bilaterally.  No wheezes, no crackles, no rhonchi.  Fair air movement.  Speaking in full sentences.  Cardiovascular system: Regular rate rhythm no murmurs rubs or gallops.  No JVD.  No lower extremity edema.  Gastrointestinal  system: Abdomen is soft, nondistended, some right-sided flank pain to palpation.  No rebound.  No guarding.  Central nervous system: Alert and oriented. No focal neurological deficits. Extremities: Symmetric 5 x 5 power. Skin: No rashes, lesions or ulcers Psychiatry: Judgement and insight appear normal. Mood & affect appropriate.     Data Reviewed: I have personally reviewed following labs and imaging studies  CBC: Recent Labs  Lab 12/09/22 1136 12/09/22 2118 12/10/22 0436 12/11/22 0415  WBC 18.3* 16.1* 10.4 7.0  NEUTROABS 15.1*  --   --  4.1  HGB 13.4 13.0 11.8* 11.4*  HCT 39.6 39.5 35.4* 36.7  MCV 88.6 90.8 91.0 95.6  PLT 237 225 200 175     Basic Metabolic Panel: Recent Labs  Lab 12/09/22 1136 12/09/22 2118 12/10/22 0436 12/11/22 0415 12/11/22 0902  NA 134*  --  138  --  140  K 3.4*  --  3.6  --  3.9  CL 103  --  109  --  111  CO2 25  --  22  --  22  GLUCOSE 118*  --  109*  --  97  BUN 20  --  15  --  11  CREATININE 0.94 0.79 0.63  --  0.72  CALCIUM 8.9  --  8.1*  --  8.1*  MG  --   --  2.4 2.2  --   PHOS  --   --   --   --  2.1*     GFR: Estimated Creatinine Clearance: 50.9 mL/min (by C-G formula based on SCr of 0.72 mg/dL).  Liver Function Tests: Recent Labs  Lab 12/09/22 1136 12/11/22 0902  AST 20  --   ALT 12  --   ALKPHOS 33*  --   BILITOT 1.3*  --   PROT 6.9  --   ALBUMIN 4.1 3.2*     CBG: No results for input(s): "GLUCAP" in the last 168 hours.   Recent Results (from the past 240 hour(s))  Urine Culture     Status: Abnormal (Preliminary result)   Collection Time: 12/09/22  1:42 PM   Specimen: Urine, Random  Result Value Ref Range Status   Specimen Description   Final    URINE, RANDOM Performed at [facility], [address] [place], [place] [address]    Special Requests URINE, CLEAN CATCH  Final   Culture (A)  Final    20,000 COLONIES/mL ESCHERICHIA COLI SUSCEPTIBILITIES TO FOLLOW Performed at [place], [place] [address], [place], [place] [address]    Report Status PENDING  Incomplete  Blood culture (routine x 2)     Status: None (Preliminary result)   Collection Time: 12/09/22  3:20 PM   Specimen: BLOOD RIGHT HAND  Result Value Ref Range Status   Specimen Description   Final    BLOOD RIGHT HAND Performed at [facility], [address] [place], [place] [address]    Special Requests   Final    BOTTLES DRAWN AEROBIC AND ANAEROBIC Blood Culture results may not be optimal due to an inadequate volume of blood received in culture bottles Performed at [facility], [address] [place], [place] [address]    Culture   Final    NO GROWTH 2 DAYS Performed at [facility], [place] [address] [place], [place] [address]    Report Status PENDING  Incomplete  Blood culture (routine x 2)     Status: None (Preliminary result)   Collection Time: 12/09/22  3:25 PM   Specimen: BLOOD  Result Value Ref Range Status   Specimen Description   Final    BLOOD BLOOD RIGHT FOREARM Performed at [facility], [address] [place], [place] [address]    Special Requests   Final    BOTTLES DRAWN AEROBIC AND ANAEROBIC Blood Culture results may not be optimal due to an inadequate volume of blood received in culture bottles Performed at [facility], [address] [place], [place] [address]    Culture   Final    NO GROWTH 2 DAYS Performed at [facility], [place] [address], [place], [place] [address]    Report Status PENDING  Incomplete         Radiology Studies: CT ABDOMEN PELVIS W CONTRAST  Result Date: 12/09/2022 CLINICAL DATA:  Bowel obstruction suspected. History of cholecystitis and pyelonephritis. RIGHT lower quadrant pain and dysuria. EXAM: CT ABDOMEN AND PELVIS WITH CONTRAST TECHNIQUE: Multidetector CT imaging of the abdomen and pelvis was performed using the standard protocol following bolus administration of intravenous contrast. RADIATION  DOSE REDUCTION: This exam was performed according to the departmental dose-optimization program which includes automated exposure control, adjustment of the mA and/or kV according to patient size and/or use of iterative reconstruction technique. CONTRAST:  32m OMNIPAQUE IOHEXOL 300 MG/ML  SOLN COMPARISON:  CT 05/26/2018, nuclear medicine renal scan 06/12/2018 FINDINGS: Lower chest: Lung bases are clear. Hepatobiliary: No focal hepatic lesion. Moderate extrahepatic biliary duct dilatation with the common bile duct measuring 9 mm. No obstructing lesion identified. No radiodense gallstones or radiodense common bile duct stones. Common bile duct measures 10 mm on comparison CT. Pancreas: Pancreas is normal. No ductal dilatation. No pancreatic inflammation. Spleen: Normal spleen Adrenals/urinary tract: Adrenal glands normal. Marked dilatation of the RIGHT renal pelvis similar to comparison exam. There is mild enhancement of the uroepithelium of the RIGHT renal pelvis. Dilatation extends into the renal calices with mild enhancement (image 47/2 for example). No obstructing lesion identified. The distal RIGHT ureter is normal. LEFT kidney and bladder normal Stomach/Bowel: Stomach, small-bowel and cecum are normal. The appendix is not identified but there is no pericecal inflammation to suggest appendicitis. The colon and rectosigmoid colon are normal. Vascular/Lymphatic: Abdominal aorta is normal caliber. No periportal or retroperitoneal adenopathy. No pelvic adenopathy. Reproductive: Post hysterectomy.  Adnexa unremarkable Other: No free fluid. Musculoskeletal: No aggressive osseous lesion. IMPRESSION: 1. Persistent marked dilatation of the RIGHT renal pelvis with mild enhancement of the uroepithelium. Findings consistent with chronic UPJ obstruction. However, the mucosal enhancement suggest potential superimposed infection of the RIGHT renal collecting system. 2. Moderate extrahepatic biliary duct dilatation similar to  comparison exam. Recommend correlation with bilirubin level. If elevated, MRCP may be warranted. 3. No evidence of bowel obstruction or inflammation. 4. Appendix not identified but no pericecal inflammation to suggest appendicitis. Electronically Signed   By: SSuzy BouchardM.D.   On: 12/09/2022 13:58        Scheduled Meds:  buPROPion  150 mg Oral Daily   citalopram  40 mg Oral Daily   enoxaparin (LOVENOX) injection  40 mg Subcutaneous Q24H   linaclotide  290 mcg Oral QAC breakfast   pantoprazole  40 mg Oral Daily   phenazopyridine  200 mg Oral TID WC   Continuous Infusions:  sodium chloride 75 mL/hr at 12/11/22 0928   cefTRIAXone (ROCEPHIN)  IV 2 g (12/10/22 1258)     LOS: 1 day    Time spent: 35 minutes    Irine Seal, MD [facility]   To contact the attending provider between 7A-7P or the covering provider during after hours 7P-7A, please log into the web site [email or web address] and access using universal [place] password for that web site. If you do not have the password, please call the hospital operator.  12/11/2022, 11:56 AM

## 2022-12-11 NOTE — TOC CM/SW Note (Signed)
Transition of Care [facility]) Screening Note  Patient Details  Name: Kim Daniel Date of Birth: 1946-09-23  Transition of Care [facility]) CM/SW Contact:    Sherie Don, LCSW Phone Number: 12/11/2022, 9:34 AM  Transition of Care Department [facility]) has reviewed patient and no TOC needs have been identified at this time. We will continue to monitor patient advancement through interdisciplinary progression rounds. If new patient transition needs arise, please place a TOC consult.

## 2022-12-11 NOTE — Progress Notes (Signed)
Mobility Specialist - Progress Note   12/11/22 0919  Mobility  Activity Ambulated independently in hallway  Level of Assistance Independent  Assistive Device None  Distance Ambulated (ft) 500 ft  Activity Response Tolerated well  Mobility Referral Yes  $Mobility charge 1 Mobility   Pt received in bed and agreeable to mobility. No complaints during session. Pt to EOB for breakfast after session with all needs met.     [facility]

## 2022-12-12 ENCOUNTER — Other Ambulatory Visit (HOSPITAL_COMMUNITY): Payer: Self-pay

## 2022-12-12 DIAGNOSIS — D72829 Elevated white blood cell count, unspecified: Secondary | ICD-10-CM

## 2022-12-12 LAB — CBC WITH DIFFERENTIAL/PLATELET
Abs Immature Granulocytes: 0.02 10*3/uL (ref 0.00–0.07)
Basophils Absolute: 0.1 10*3/uL (ref 0.0–0.1)
Basophils Relative: 1 %
Eosinophils Absolute: 0.1 10*3/uL (ref 0.0–0.5)
Eosinophils Relative: 2 %
HCT: 38.2 % (ref 36.0–46.0)
Hemoglobin: 12.2 g/dL (ref 12.0–15.0)
Immature Granulocytes: 0 %
Lymphocytes Relative: 37 %
Lymphs Abs: 2.1 10*3/uL (ref 0.7–4.0)
MCH: 29.1 pg (ref 26.0–34.0)
MCHC: 31.9 g/dL (ref 30.0–36.0)
MCV: 91.2 fL (ref 80.0–100.0)
Monocytes Absolute: 0.5 10*3/uL (ref 0.1–1.0)
Monocytes Relative: 9 %
Neutro Abs: 2.9 10*3/uL (ref 1.7–7.7)
Neutrophils Relative %: 51 %
Platelets: 220 10*3/uL (ref 150–400)
RBC: 4.19 MIL/uL (ref 3.87–5.11)
RDW: 13 % (ref 11.5–15.5)
WBC: 5.7 10*3/uL (ref 4.0–10.5)
nRBC: 0 % (ref 0.0–0.2)

## 2022-12-12 LAB — BASIC METABOLIC PANEL
Anion gap: 5 (ref 5–15)
BUN: 7 mg/dL — ABNORMAL LOW (ref 8–23)
CO2: 20 mmol/L — ABNORMAL LOW (ref 22–32)
Calcium: 8.3 mg/dL — ABNORMAL LOW (ref 8.9–10.3)
Chloride: 114 mmol/L — ABNORMAL HIGH (ref 98–111)
Creatinine, Ser: 0.65 mg/dL (ref 0.44–1.00)
GFR, Estimated: >60 mL/min (ref >60)
Glucose, Bld: 92 mg/dL (ref 70–99)
Potassium: 3.5 mmol/L (ref 3.5–5.1)
Sodium: 139 mmol/L (ref 135–145)

## 2022-12-12 LAB — URINE CULTURE: Culture: 20000 — AB

## 2022-12-12 MED ORDER — K PHOS MONO-SOD PHOS DI & MONO 155-852-130 MG PO TABS
250.0000 mg | ORAL_TABLET | Freq: Two times a day (BID) | ORAL | 0 refills | Status: AC
  Filled 2022-12-12: qty 4, 2d supply, fill #0

## 2022-12-12 MED ORDER — CEFADROXIL 500 MG PO CAPS
1000.0000 mg | ORAL_CAPSULE | Freq: Two times a day (BID) | ORAL | Status: DC
  Administered 2022-12-12: 1000 mg via ORAL
  Filled 2022-12-12: qty 2

## 2022-12-12 MED ORDER — CEFADROXIL 500 MG PO CAPS
1000.0000 mg | ORAL_CAPSULE | Freq: Two times a day (BID) | ORAL | 0 refills | Status: AC
  Filled 2022-12-12: qty 28, 7d supply, fill #0

## 2022-12-12 MED ORDER — POTASSIUM CHLORIDE CRYS ER 10 MEQ PO TBCR
40.0000 meq | EXTENDED_RELEASE_TABLET | Freq: Once | ORAL | Status: AC
  Administered 2022-12-12: 40 meq via ORAL

## 2022-12-12 MED ORDER — ACETAMINOPHEN 325 MG PO TABS: 650.0000 mg | ORAL_TABLET | Freq: Four times a day (QID) | ORAL | Status: AC | PRN

## 2022-12-12 NOTE — Progress Notes (Signed)
Reviewed written d/c instructions w pt and all questions answered. She verbalized understanding. D/C via w/c w all belongings in stable condition. 

## 2022-12-12 NOTE — Discharge Summary (Signed)
Physician Discharge Summary  Kim Daniel [id number] DOB: 1945/12/27 DOA: 12/09/2022  PCP: Eulas Post, MD  Admit date: 12/09/2022 Discharge date: 12/12/2022  Time spent: 55 minutes  Recommendations for Outpatient Follow-up:  Follow-up with Eulas Post, MD in 2 weeks.  On follow-up patient will need a basic metabolic profile done to follow-up on electrolytes and renal function.  Patient is acute pyelonephritis will need to be followed up upon. Follow-up with Dr. Louis Meckel, urology in 2 to 3 weeks.   Discharge Diagnoses:  Principal Problem:   Pyelonephritis of right kidney Active Problems:   Pyelonephritis   UPJ (ureteropelvic junction) obstruction   Depression   Leukocytosis   Discharge Condition: Improved.  Diet recommendation: Regular  Filed Weights   12/09/22 1124 12/09/22 2047  Weight: 61.2 kg 60.3 kg    History of present illness:  HPI per Dr. Gorden Harms Kim Daniel is a 77 y.o. female with medical history significant of anxiety, depression, GERD, ureteral obstruction who presented to the emergency department with abdominal pain, nausea, vomiting.  Patient was endorsing right-sided abdominal pain for the last 24 hours.  Also had some dysuria and concern for development of UTI.  She presented to the ER where she was found to be afebrile and hemodynamically stable.  Labs were obtained which notable for sodium 134, potassium 3.4, WBC 18.3, urinalysis concerning for infection.  Creatinine 0.9.  Urine cultures were obtained however pending.  Lactic acid 1.9.  Patient underwent CT abdomen/pelvis which demonstrated persistent dilation of the right renal pelvis consistent with chronic ureteropelvic junction obstruction.  Was also concern for superimposed infection.  CT did also note intrahepatic biliary ductal dilation with recommended outpatient follow-up.  Patient's bilirubin was 1.3.  Patient was given IV ceftriaxone and fluids and admitted for observation.   Since admission patient's symptoms have been well-controlled she has remained afebrile and hemodynamically stable.  Urology was Contacted and recommended observation with IV fluids and antibiotics.   Hospital Course:  #1 acute pyelonephritis of the right kidney/chronic UPJ obstruction complicated by acute pyelonephritis, POA -Patient admitted with nausea vomiting abdominal pain, right flank pain, urinalysis concerning for UTI. -CT abdomen and pelvis with persistent dilation of the right renal pelvis consistent with chronic ureteropelvic junction obstruction. -Urinalysis done concerning for UTI. -Urine cultures with 20,000 colonies of E. coli, which was pansensitive. -Blood cultures pending with no growth to date. -Patient maintained on IV Rocephin, as well as Pyridium and improved clinically during the hospitalization.   -Urine culture sensitivities noted to be pansensitive and patient subsequently transition to [facility] on day of discharge.   -Patient will be discharged on 7 more days of Duricef to complete a 10-day course of antibiotic treatment.   -Case discussed with urology, Dr. Lovena Neighbours due to patient's chronic UPJ obstruction and he was recommended to continue current antibiotic treatment with outpatient follow-up with primary urologist, Dr. Louis Meckel.   -Patient will be discharged in stable improved condition with outpatient follow-up.    2.  GERD -Patient maintained on PPI.   3.  Depression -Patient was maintained on home regimen Celexa, Wellbutrin.   4.  Extrahepatic biliary ductal dilatation -Noted on CT scan that showed moderate extrahepatic biliary duct dilatation similar to comparison exam.  Recommend correlation with bilirubin level and if elevated MRCP may be warranted. -Will need outpatient follow-up with PCP with repeat imaging.    Procedures: CT abdomen pelvis 12/09/2022    Consultations: Curb sided urology: Dr. Lovena Neighbours  Discharge Exam: Vitals:   12/11/22  2019 12/12/22  0631  BP: 133/73 (!) 148/82  Pulse: 92 65  Resp: 18 18  Temp: 98.7 F (37.1 C) 98.6 F (37 C)  SpO2: 95% 95%    General: NAD Cardiovascular: RRR no murmurs rubs or gallops.  No JVD.  No lower extremity edema. Respiratory: Clear to auscultation bilaterally.  No wheezes, no crackles, no rhonchi.  Fair air movement.  Speaking in full sentences.  Discharge Instructions   Discharge Instructions     Diet general   Complete by: As directed    Increase activity slowly   Complete by: As directed       Allergies as of 12/12/2022       Reactions   Codeine Nausea Only   Hydrocodone Nausea And Vomiting, Other (See Comments)   "get dizzy and spaced out" Other reaction(s): Other (See Comments) "get dizzy and spaced out"        Medication List     STOP taking these medications    clotrimazole-betamethasone cream Commonly known as: Lotrisone   metoprolol tartrate 50 MG tablet Commonly known as: LOPRESSOR       TAKE these medications    acetaminophen 325 MG tablet Commonly known as: TYLENOL Take 2 tablets (650 mg total) by mouth every 6 (six) hours as needed for mild pain (or Fever >/= 101).   buPROPion 150 MG 24 hr tablet Commonly known as: WELLBUTRIN XL TAKE 1 TABLET BY MOUTH ONCE A DAY   CALTRATE 600+D PO Take 1 tablet by mouth 3 (three) times a week.   cefadroxil 500 MG capsule Commonly known as: DURICEF Take 2 capsules (1,000 mg total) by mouth 2 (two) times daily for 7 days.   citalopram 40 MG tablet Commonly known as: CELEXA TAKE 1 TABLET BY MOUTH ONCE DAILY   clonazePAM 0.5 MG tablet Commonly known as: KLONOPIN Take 0.5 mg by mouth at bedtime.   denosumab 60 MG/ML Sosy injection Commonly known as: PROLIA Inject 60 mg into the skin every 3 (three) months.   fluticasone 50 MCG/ACT nasal spray Commonly known as: FLONASE PLACE 2 SPRAYS INTO BOTH NOSTRILS DAILY   linaclotide 290 MCG Caps capsule Commonly known as: Linzess Take 1 capsule (290 mcg  total) by mouth every morning. What changed: when to take this   naproxen 375 MG tablet Commonly known as: NAPROSYN Take 375 mg by mouth daily as needed for moderate pain.   pantoprazole 40 MG tablet Commonly known as: PROTONIX TAKE 1 TABLET BY MOUTH ONCE DAILY   phenazopyridine 200 MG tablet Commonly known as: Pyridium Take 1 tablet (200 mg total) by mouth 3 (three) times daily as needed for pain.   phosphorus 155-852-130 MG tablet Commonly known as: K PHOS NEUTRAL Take 1 tablet (250 mg total) by mouth 2 (two) times daily for 2 days.   SUMAtriptan 100 MG tablet Commonly known as: IMITREX TAKE 1 TABLET BY MOUTH AT ONSET OF MIGRAINE. MAY REPEAT DOSE IN 1 HOUR IF HEADACHE NOT RESOLVED   zolpidem 5 MG tablet Commonly known as: AMBIEN TAKE 1 TABLET BY MOUTH AT BEDTIME AS NEEDED FOR SLEEP. What changed:  how much to take reasons to take this additional instructions       Allergies  Allergen Reactions   Codeine Nausea Only   Hydrocodone Nausea And Vomiting and Other (See Comments)    "get dizzy and spaced out" Other reaction(s): Other (See Comments) "get dizzy and spaced out"    Follow-up Information     Eulas Post,  MD. Schedule an appointment as soon as possible for a visit in 2 week(s).   Specialty: Family Medicine Contact information: [place] [address] [phone]         Ardis Hughs, MD. Schedule an appointment as soon as possible for a visit in 3 week(s).   Specialty: Urology Why: f/u in 2-3 weeks. Contact information: [place] [address] [phone]                  The results of significant diagnostics from this hospitalization (including imaging, microbiology, ancillary and laboratory) are listed below for reference.    Significant Diagnostic Studies: CT ABDOMEN PELVIS W CONTRAST  Result Date: 12/09/2022 CLINICAL DATA:  Bowel obstruction suspected. History of cholecystitis and  pyelonephritis. RIGHT lower quadrant pain and dysuria. EXAM: CT ABDOMEN AND PELVIS WITH CONTRAST TECHNIQUE: Multidetector CT imaging of the abdomen and pelvis was performed using the standard protocol following bolus administration of intravenous contrast. RADIATION DOSE REDUCTION: This exam was performed according to the departmental dose-optimization program which includes automated exposure control, adjustment of the mA and/or kV according to patient size and/or use of iterative reconstruction technique. CONTRAST:  50m OMNIPAQUE IOHEXOL 300 MG/ML  SOLN COMPARISON:  CT 05/26/2018, nuclear medicine renal scan 06/12/2018 FINDINGS: Lower chest: Lung bases are clear. Hepatobiliary: No focal hepatic lesion. Moderate extrahepatic biliary duct dilatation with the common bile duct measuring 9 mm. No obstructing lesion identified. No radiodense gallstones or radiodense common bile duct stones. Common bile duct measures 10 mm on comparison CT. Pancreas: Pancreas is normal. No ductal dilatation. No pancreatic inflammation. Spleen: Normal spleen Adrenals/urinary tract: Adrenal glands normal. Marked dilatation of the RIGHT renal pelvis similar to comparison exam. There is mild enhancement of the uroepithelium of the RIGHT renal pelvis. Dilatation extends into the renal calices with mild enhancement (image 47/2 for example). No obstructing lesion identified. The distal RIGHT ureter is normal. LEFT kidney and bladder normal Stomach/Bowel: Stomach, small-bowel and cecum are normal. The appendix is not identified but there is no pericecal inflammation to suggest appendicitis. The colon and rectosigmoid colon are normal. Vascular/Lymphatic: Abdominal aorta is normal caliber. No periportal or retroperitoneal adenopathy. No pelvic adenopathy. Reproductive: Post hysterectomy.  Adnexa unremarkable Other: No free fluid. Musculoskeletal: No aggressive osseous lesion. IMPRESSION: 1. Persistent marked dilatation of the RIGHT renal pelvis  with mild enhancement of the uroepithelium. Findings consistent with chronic UPJ obstruction. However, the mucosal enhancement suggest potential superimposed infection of the RIGHT renal collecting system. 2. Moderate extrahepatic biliary duct dilatation similar to comparison exam. Recommend correlation with bilirubin level. If elevated, MRCP may be warranted. 3. No evidence of bowel obstruction or inflammation. 4. Appendix not identified but no pericecal inflammation to suggest appendicitis. Electronically Signed   By: SSuzy BouchardM.D.   On: 12/09/2022 13:58    Microbiology: Recent Results (from the past 240 hour(s))  Urine Culture     Status: Abnormal   Collection Time: 12/09/22  1:42 PM   Specimen: Urine, Random  Result Value Ref Range Status   Specimen Description   Final    URINE, RANDOM Performed at [facility] [address] [place] [address]   Special Requests   Final    URINE, CLEAN CATCH Performed at [facility] [address], [place], [place] [address]   Culture 20,000 COLONIES/mL ESCHERICHIA COLI (A)  Final   Report Status 12/12/2022 FINAL  Final   Organism  ID, Bacteria ESCHERICHIA COLI (A)  Final      Susceptibility   Escherichia coli - MIC*    AMPICILLIN <=2 SENSITIVE Sensitive     CEFAZOLIN <=4 SENSITIVE Sensitive     CEFTRIAXONE <=0.25 SENSITIVE Sensitive     CIPROFLOXACIN <=0.25 SENSITIVE Sensitive     GENTAMICIN <=1 SENSITIVE Sensitive     IMIPENEM <=0.25 SENSITIVE Sensitive     NITROFURANTOIN <=16 SENSITIVE Sensitive     TRIMETH/SULFA <=20 SENSITIVE Sensitive     AMPICILLIN/SULBACTAM <=2 SENSITIVE Sensitive     * 20,000 COLONIES/mL ESCHERICHIA COLI  Blood culture (routine x 2)     Status: None (Preliminary result)   Collection Time: 12/09/22  3:20 PM   Specimen: BLOOD RIGHT HAND  Result Value Ref Range Status   Specimen Description   Final    BLOOD RIGHT HAND Performed at [facility], [address] [place], [place] [address]    Special Requests   Final    BOTTLES DRAWN AEROBIC AND ANAEROBIC Blood Culture results may not be optimal due to an inadequate volume of blood received in culture bottles Performed at [facility], [address] [place], [place] [address]    Culture   Final    NO GROWTH 3 DAYS Performed at [facility], [address], [place], [place] [address]    Report Status PENDING  Incomplete  Blood culture (routine x 2)     Status: None (Preliminary result)   Collection Time: 12/09/22  3:25 PM   Specimen: BLOOD  Result Value Ref Range Status   Specimen Description   Final    BLOOD BLOOD RIGHT FOREARM Performed at [facility], [address] [place], [place] [address]    Special Requests   Final    BOTTLES DRAWN AEROBIC AND ANAEROBIC Blood Culture results may not be optimal due to an inadequate volume of blood received in culture bottles Performed at [facility], [address] [place], [place] [address]    Culture   Final    NO GROWTH 3 DAYS Performed at [facility], [place] [address], [place], [place] [address]    Report Status PENDING  Incomplete     Labs: Basic Metabolic Panel: Recent Labs  Lab 12/09/22 1136 12/09/22 2118 12/10/22 0436 12/11/22 0415 12/11/22 0902 12/12/22 0449  NA 134*  --  138  --  140 139  K 3.4*  --  3.6  --  3.9 3.5  CL 103  --  109  --  111 114*  CO2 25  --  22  --  22 20*  GLUCOSE 118*  --  109*  --  97 92  BUN 20  --  15  --  11 7*  CREATININE 0.94 0.79 0.63  --  0.72 0.65  CALCIUM 8.9  --  8.1*  --  8.1* 8.3*  MG  --   --  2.4 2.2  --   --   PHOS  --   --   --   --  2.1*  --    Liver Function Tests: Recent Labs  Lab 12/09/22 1136 12/11/22 0902  AST 20  --   ALT 12  --   ALKPHOS 33*  --   BILITOT 1.3*  --   PROT 6.9  --   ALBUMIN 4.1 3.2*   Recent Labs  Lab 12/09/22 1136  LIPASE 29   No  results for input(s): "AMMONIA" in the last 168 hours. CBC: Recent Labs  Lab  12/09/22 1136 12/09/22 2118 12/10/22 0436 12/11/22 0415 12/12/22 0449  WBC 18.3* 16.1* 10.4 7.0 5.7  NEUTROABS 15.1*  --   --  4.1 2.9  HGB 13.4 13.0 11.8* 11.4* 12.2  HCT 39.6 39.5 35.4* 36.7 38.2  MCV 88.6 90.8 91.0 95.6 91.2  PLT 237 225 200 175 220   Cardiac Enzymes: No results for input(s): "CKTOTAL", "CKMB", "CKMBINDEX", "TROPONINI" in the last 168 hours. BNP: BNP (last 3 results) No results for input(s): "BNP" in the last 8760 hours.  ProBNP (last 3 results) No results for input(s): "PROBNP" in the last 8760 hours.  CBG: No results for input(s): "GLUCAP" in the last 168 hours.     Signed:  Irine Seal MD.  [facility] 12/12/2022, 1:05 PM

## 2022-12-13 ENCOUNTER — Telehealth: Payer: Self-pay

## 2022-12-13 NOTE — Transitions of Care (Post Inpatient/ED Visit) (Signed)
   12/13/2022  Name: DEMECIA Daniel MRN: [id number] DOB: Oct 02, 1946  Today's TOC FU Call Status: Today's TOC FU Call Status:: Successful TOC FU Call Competed TOC FU Call Complete Date: 12/13/22  Transition Care Management Follow-up Telephone Call Date of Discharge: 12/12/22 Discharge Facility: [facility]) Type of Discharge: Inpatient Admission Primary Inpatient Discharge Diagnosis:: "pyelonephritis of right kidney" How have you been since you were released from the hospital?: Better Any questions or concerns?: No  Items Reviewed: Did you receive and understand the discharge instructions provided?: Yes Medications obtained and verified?: Yes (Medications Reviewed) Any new allergies since your discharge?: No Dietary orders reviewed?: Yes Type of Diet Ordered:: regular Do you have support at home?: Yes People in Home: spouse Name of Support/Comfort Primary Source: [facility] [place] and Equipment/Supplies: [place] Ordered?: No Any new equipment or medical supplies ordered?: No  Functional Questionnaire: Do you need assistance with bathing/showering or dressing?: No Do you need assistance with meal preparation?: No Do you need assistance with eating?: No Do you have difficulty maintaining continence: No Do you need assistance with getting out of bed/getting out of a chair/moving?: No  Folllow up appointments reviewed: PCP Follow-up appointment confirmed?: Yes Date of PCP follow-up appointment?: 01/05/23 (patient states earliest appt available) Follow-up Provider: Dr. Elease Hashimoto Specialist [facility] Follow-up appointment confirmed?: Yes Date of Specialist follow-up appointment?: 01/01/23 Follow-Up Specialty Provider:: Dr. Louis Meckel Do you need transportation to your follow-up appointment?: No Do you understand care options if your condition(s) worsen?: Yes-patient verbalized understanding  SDOH Interventions Today    Flowsheet Row Most Recent Value  SDOH  Interventions   Food Insecurity Interventions Intervention Not Indicated  Transportation Interventions Intervention Not Indicated      TOC Interventions Today    Flowsheet Row Most Recent Value  TOC Interventions   TOC Interventions Discussed/Reviewed TOC Interventions Discussed, S/S of infection       Interventions Today    Flowsheet Row Most Recent Value  Education Interventions   Education Provided Provided Education  Provided Verbal Education On When to see the doctor  Nutrition Interventions   Nutrition Discussed/Reviewed Nutrition Discussed  Pharmacy Interventions   Pharmacy Dicussed/Reviewed Pharmacy Topics Discussed, Medications and their functions  Safety Interventions   Safety Discussed/Reviewed Safety Discussed, Fall Risk        [facility] Care Management Care Management Community Coordinator Direct Phone: [phone] Toll Free: [phone] Fax: [phone]

## 2022-12-14 LAB — CULTURE, BLOOD (ROUTINE X 2)
Culture: NO GROWTH
Culture: NO GROWTH

## 2023-01-01 DIAGNOSIS — N1330 Unspecified hydronephrosis: Secondary | ICD-10-CM | POA: Diagnosis not present

## 2023-01-01 DIAGNOSIS — N390 Urinary tract infection, site not specified: Secondary | ICD-10-CM | POA: Diagnosis not present

## 2023-01-01 DIAGNOSIS — N3000 Acute cystitis without hematuria: Secondary | ICD-10-CM | POA: Diagnosis not present

## 2023-01-05 ENCOUNTER — Encounter: Payer: Self-pay | Admitting: Family Medicine

## 2023-01-05 ENCOUNTER — Ambulatory Visit (INDEPENDENT_AMBULATORY_CARE_PROVIDER_SITE_OTHER): Payer: Medicare Other | Admitting: Family Medicine

## 2023-01-05 VITALS — BP 130/82 | HR 69 | Temp 97.6°F | Ht 63.78 in | Wt 128.8 lb

## 2023-01-05 DIAGNOSIS — Z0000 Encounter for general adult medical examination without abnormal findings: Secondary | ICD-10-CM

## 2023-01-05 DIAGNOSIS — Z1159 Encounter for screening for other viral diseases: Secondary | ICD-10-CM | POA: Diagnosis not present

## 2023-01-05 LAB — CBC WITH DIFFERENTIAL/PLATELET
Basophils Absolute: 0 10*3/uL (ref 0.0–0.1)
Basophils Relative: 0.8 % (ref 0.0–3.0)
Eosinophils Absolute: 0.1 10*3/uL (ref 0.0–0.7)
Eosinophils Relative: 1.3 % (ref 0.0–5.0)
HCT: 39.8 % (ref 36.0–46.0)
Hemoglobin: 13.5 g/dL (ref 12.0–15.0)
Lymphocytes Relative: 32.9 % (ref 12.0–46.0)
Lymphs Abs: 2 10*3/uL (ref 0.7–4.0)
MCHC: 34 g/dL (ref 30.0–36.0)
MCV: 87.7 fl (ref 78.0–100.0)
Monocytes Absolute: 0.6 10*3/uL (ref 0.1–1.0)
Monocytes Relative: 9.9 % (ref 3.0–12.0)
Neutro Abs: 3.4 10*3/uL (ref 1.4–7.7)
Neutrophils Relative %: 55.1 % (ref 43.0–77.0)
Platelets: 263 10*3/uL (ref 150.0–400.0)
RBC: 4.54 Mil/uL (ref 3.87–5.11)
RDW: 14.1 % (ref 11.5–15.5)
WBC: 6.2 10*3/uL (ref 4.0–10.5)

## 2023-01-05 LAB — COMPREHENSIVE METABOLIC PANEL
ALT: 12 U/L (ref 0–35)
AST: 23 U/L (ref 0–37)
Albumin: 4 g/dL (ref 3.5–5.2)
Alkaline Phosphatase: 28 U/L — ABNORMAL LOW (ref 39–117)
BUN: 12 mg/dL (ref 6–23)
CO2: 25 mEq/L (ref 19–32)
Calcium: 9 mg/dL (ref 8.4–10.5)
Chloride: 107 mEq/L (ref 96–112)
Creatinine, Ser: 0.76 mg/dL (ref 0.40–1.20)
GFR: 75.65 mL/min (ref >60.00)
Glucose, Bld: 80 mg/dL (ref 70–99)
Potassium: 4.1 mEq/L (ref 3.5–5.1)
Sodium: 140 mEq/L (ref 135–145)
Total Bilirubin: 0.6 mg/dL (ref 0.2–1.2)
Total Protein: 6.5 g/dL (ref 6.0–8.3)

## 2023-01-05 LAB — LIPID PANEL
Cholesterol: 197 mg/dL (ref 0–200)
HDL: 78.5 mg/dL (ref >39.00)
LDL Cholesterol: 109 mg/dL — ABNORMAL HIGH (ref 0–99)
NonHDL: 118.3
Total CHOL/HDL Ratio: 3
Triglycerides: 49 mg/dL (ref 0.0–149.0)
VLDL: 9.8 mg/dL (ref 0.0–40.0)

## 2023-01-05 NOTE — Patient Instructions (Signed)
Consider Shingrix vaccine at some point this year.  

## 2023-01-05 NOTE — Progress Notes (Signed)
Established Patient Office Visit  Subjective   Patient ID: Kim Daniel, female    DOB: 1946-05-25  Age: 77 y.o. MRN: [id number]  No chief complaint on file.   HPI   Kim Daniel is seen for physical exam.  She has history of IBS, GERD, migraine headaches, osteoporosis, chronic insomnia, recurrent depression.  She had recent admission for presumed pyelonephritis.  This occurred back in February.  She had presented with right-sided abdominal pain for 24 hours in the setting of some dysuria.  Was afebrile in the ER and hemodynamically stable.  White count 18,000.  Urinalysis concerning for infection.  Urine culture grew out 20,000 colonies E. coli and blood cultures were negative.  CT abdomen pelvis showed persistent dilation right renal pelvis consistent with chronic ureteropelvic junction obstruction.  There was also comment of intrahepatic biliary ductal dilatation of 9 mm which compared with 10 mm noted previously.  Total bilirubin 1.3.  She was treated with IV ceftriaxone and symptoms gradually improved and she was discharged on [place].  Patient has been to urologist since discharge and apparently placed on prophylactic antibiotic.  She is not sure of name but thinks this may be Macrobid.  No history of hepatitis C screening but low risk.  Health maintenance reviewed  -She still sees GYN regularly. -She had Zostavax when about 2010 but no history of Shingrix. -She still gets yearly mammograms through GYN -Cologuard 6/22 negative -No history of documented hepatitis C screening  Family history-Father died of lung cancer complications.  Mother died of nonalcoholic cirrhosis.  Exact etiology unclear.  She states she also had maternal grandmother and not with nonalcoholic cirrhosis.  Her mother actually had a liver transplant [place]. She has 2 sisters who are alive and generally doing well.   Social history-she is married.  Quit smoking in 2000.  2 children.  She has  several grandchildren.  Usually 1 alcoholic drink per week.  Past Medical History:  Diagnosis Date   Anxiety    Anxiety and depression    Chronic constipation    Depression    GERD (gastroesophageal reflux disease)    History of hiatal hernia    History of migraine headaches    Seasonal allergic rhinitis    SUI (stress urinary incontinence, female)    Ureteral obstruction, right    Wears contact lenses    Past Surgical History:  Procedure Laterality Date   ANTERIOR CERVICAL DECOMP/DISCECTOMY FUSION  03-10-2004   C4 -- C6   BREAST BIOPSY Right 1980   BREAST EXCISIONAL BIOPSY Right 1980   COLONOSCOPY WITH PROPOFOL  03-08-2009   CYSTOSCOPY W/ URETERAL STENT PLACEMENT Right 10/02/2014   Procedure: CYSTOSCOPY WITH RIGHT RETROGRADE PYELOGRAM , URETERAL STENT PLACEMENT;  Surgeon: Claybon Jabs, MD;  Location: [facility];  Service: Urology;  Laterality: Right;   ESOPHAGOGASTRODUODENOSCOPY  04-02-2002   TOTAL HIP ARTHROPLASTY  03/03/2012   Procedure: TOTAL HIP ARTHROPLASTY;  Surgeon: Mauri Pole, MD;  Location: [facility];  Service: Orthopedics;  Laterality: Left;   [place]    reports that she quit smoking about 23 years ago. Her smoking use included cigarettes. She has a 10.00 pack-year smoking history. She has never used smokeless tobacco. She reports current alcohol use of about 1.0 standard drink of alcohol per week. She reports that she does not use drugs. family history includes Breast cancer in her maternal uncle; Cancer (age of onset: 11) in her  father; Cirrhosis in her mother. Allergies  Allergen Reactions   Codeine Nausea Only   Hydrocodone Nausea And Vomiting and Other (See Comments)    "get dizzy and spaced out" Other reaction(s): Other (See Comments) "get dizzy and spaced out"      Review of Systems  Constitutional:  Negative for chills, fever, malaise/fatigue and weight loss.  HENT:  Negative for hearing loss.    Eyes:  Negative for blurred vision and double vision.  Respiratory:  Negative for cough and shortness of breath.   Cardiovascular:  Negative for chest pain, palpitations and leg swelling.  Gastrointestinal:  Negative for abdominal pain, blood in stool, constipation and diarrhea.  Genitourinary:  Negative for dysuria.  Skin:  Negative for rash.  Neurological:  Negative for dizziness, speech change, seizures, loss of consciousness and headaches.  Psychiatric/Behavioral:  Negative for depression.       Objective:     BP 130/82 (BP Location: Left Arm, Patient Position: Sitting, Cuff Size: Normal)   Pulse 69   Temp 97.6 F (36.4 C) (Oral)   Ht 5' 3.78" (1.62 m)   Wt 128 lb 12.8 oz (58.4 kg)   SpO2 97%   BMI 22.26 kg/m  BP Readings from Last 3 Encounters:  01/05/23 130/82  12/12/22 (!) 148/82  09/05/22 102/60   Wt Readings from Last 3 Encounters:  01/05/23 128 lb 12.8 oz (58.4 kg)  12/09/22 132 lb 15 oz (60.3 kg)  09/05/22 134 lb 9.6 oz (61.1 kg)      Physical Exam Vitals reviewed.  Constitutional:      Appearance: She is well-developed.  HENT:     Head: Normocephalic and atraumatic.  Eyes:     Pupils: Pupils are equal, round, and reactive to light.  Neck:     Thyroid: No thyromegaly.  Cardiovascular:     Rate and Rhythm: Normal rate and regular rhythm.     Heart sounds: Normal heart sounds. No murmur heard. Pulmonary:     Effort: No respiratory distress.     Breath sounds: Normal breath sounds. No wheezing or rales.  Abdominal:     General: Bowel sounds are normal. There is no distension.     Palpations: Abdomen is soft. There is no mass.     Tenderness: There is no abdominal tenderness. There is no guarding or rebound.  Musculoskeletal:        General: Normal range of motion.     Cervical back: Normal range of motion and neck supple.  Lymphadenopathy:     Cervical: No cervical adenopathy.  Skin:    Findings: No rash.  Neurological:     Mental Status: She  is alert and oriented to person, place, and time.     Cranial Nerves: No cranial nerve deficit.  Psychiatric:        Behavior: Behavior normal.        Thought Content: Thought content normal.        Judgment: Judgment normal.      No results found for any visits on 01/05/23.    The ASCVD Risk score (Arnett DK, et al., 2019) failed to calculate for the following reasons:   Cannot find a previous HDL lab   Cannot find a previous total cholesterol lab    Assessment & Plan:   Problem List Items Addressed This Visit   None Visit Diagnoses     Physical exam    -  Primary   Relevant Orders   Lipid panel   CBC  with Differential/Platelet   CMP   Encounter for hepatitis C screening test for low risk patient       Relevant Orders   Hep C Antibody     -Recent hospitalization as above for presumed pyelonephritis right kidney.  She has seen urologist and is now on prophylaxis and she will try to get [place] name of antibiotic to confirm  we discussed the following health maintenance items  -Continue annual flu vaccine -Consider Shingrix.  She will check on insurance coverage -She plans to continue yearly mammograms through GYN -Check hepatitis C antibody -Recheck labs as above.  If elevating total bilirubin consider MRCP to further evaluate  No follow-ups on file.    Carolann Littler, MD

## 2023-01-08 DIAGNOSIS — E559 Vitamin D deficiency, unspecified: Secondary | ICD-10-CM | POA: Diagnosis not present

## 2023-01-08 DIAGNOSIS — M810 Age-related osteoporosis without current pathological fracture: Secondary | ICD-10-CM | POA: Diagnosis not present

## 2023-01-08 LAB — HEPATITIS C ANTIBODY: Hepatitis C Ab: NONREACTIVE

## 2023-01-14 ENCOUNTER — Telehealth: Payer: Self-pay

## 2023-01-14 NOTE — Telephone Encounter (Signed)
Transfer from Dr. Layne Benton  Last Prolia inj 01/08/23 Next Prolia inj due 07/12/23

## 2023-02-12 ENCOUNTER — Other Ambulatory Visit: Payer: Self-pay | Admitting: Family Medicine

## 2023-02-13 DIAGNOSIS — N3000 Acute cystitis without hematuria: Secondary | ICD-10-CM | POA: Diagnosis not present

## 2023-02-13 DIAGNOSIS — N1330 Unspecified hydronephrosis: Secondary | ICD-10-CM | POA: Diagnosis not present

## 2023-02-20 ENCOUNTER — Other Ambulatory Visit: Payer: Self-pay | Admitting: Family Medicine

## 2023-02-20 DIAGNOSIS — F5101 Primary insomnia: Secondary | ICD-10-CM

## 2023-02-22 DIAGNOSIS — M7061 Trochanteric bursitis, right hip: Secondary | ICD-10-CM | POA: Diagnosis not present

## 2023-02-22 DIAGNOSIS — M7062 Trochanteric bursitis, left hip: Secondary | ICD-10-CM | POA: Diagnosis not present

## 2023-03-12 ENCOUNTER — Telehealth: Payer: Self-pay | Admitting: Family Medicine

## 2023-03-12 ENCOUNTER — Other Ambulatory Visit: Payer: Self-pay | Admitting: Family

## 2023-03-12 MED ORDER — DICLOFENAC SODIUM 75 MG PO TBEC: 75.0000 mg | DELAYED_RELEASE_TABLET | Freq: Two times a day (BID) | ORAL | 1 refills | Status: DC

## 2023-03-12 NOTE — Telephone Encounter (Signed)
Patient informed. 

## 2023-03-12 NOTE — Telephone Encounter (Addendum)
Pt called to say that she is aware that MD is OOO until June, but she would like to resume taking the Diclofenac.  Pt is wondering if another provider can assist her with a refill?  Please advise.   LOV:  01/05/23 = CPE  [facility] - [place], [place] - [address] Phone: [phone]  Fax: [phone]

## 2023-03-20 ENCOUNTER — Other Ambulatory Visit: Payer: Self-pay | Admitting: Internal Medicine

## 2023-03-20 DIAGNOSIS — F5101 Primary insomnia: Secondary | ICD-10-CM

## 2023-04-10 ENCOUNTER — Telehealth: Payer: Self-pay | Admitting: Family Medicine

## 2023-04-10 DIAGNOSIS — R109 Unspecified abdominal pain: Secondary | ICD-10-CM

## 2023-04-10 NOTE — Telephone Encounter (Signed)
Requesting a referral to gastroenterology, says her bowel issues are getting worse. Has OV scheduled for 04/18/23

## 2023-04-11 NOTE — Telephone Encounter (Signed)
Spoke with patient and she has preference to see provider in [place]. Referral placed/

## 2023-04-11 NOTE — Addendum Note (Signed)
Addended by: Christy Sartorius on: 04/11/2023 09:53 AM   Modules accepted: Orders

## 2023-04-18 ENCOUNTER — Ambulatory Visit (INDEPENDENT_AMBULATORY_CARE_PROVIDER_SITE_OTHER): Payer: Medicare HMO | Admitting: Family Medicine

## 2023-04-18 ENCOUNTER — Encounter: Payer: Self-pay | Admitting: Family Medicine

## 2023-04-18 VITALS — BP 96/60 | HR 110 | Temp 98.1°F | Ht 63.78 in | Wt 123.4 lb

## 2023-04-18 DIAGNOSIS — R159 Full incontinence of feces: Secondary | ICD-10-CM

## 2023-04-18 DIAGNOSIS — K120 Recurrent oral aphthae: Secondary | ICD-10-CM | POA: Diagnosis not present

## 2023-04-18 MED ORDER — TRIAMCINOLONE ACETONIDE 0.1 % MT PSTE: 1.0000 | PASTE | Freq: Two times a day (BID) | OROMUCOSAL | 5 refills | Status: AC

## 2023-04-18 NOTE — Progress Notes (Signed)
Established Patient Office Visit  Subjective   Patient ID: Kim Daniel, female    DOB: 05-19-1946  Age: 77 y.o. MRN: [id number]  Chief Complaint  Patient presents with   Abdominal Pain    HPI   Kim Daniel is seen today for the following issues  She states for several weeks she has had loose to sometimes liquid stools in the mornings after eating breakfast.  She generally eats cereal.  She tried lactose-free milk but still has had the same symptoms.  She tends to have the symptoms almost daily after eating breakfast but not during other times of the day.  Years ago she had constipation issues and actually took Linzess at 1 time.  She has history of reported IBS but has not had any constipation now in years.  She does not have any known history of gluten intolerance or celiac disease nor any known family history.  No bloody stools.  Last colonoscopy around 2011.  She states her appetite is up and down.  Has lost about 5 pounds since last visit.  Had labs back in March and CBC and CMP were normal at that time.  Other issue is recurrent aphthous ulcers.  She has used triamcinolone 0.1% dental paste in the past which worked well and she is requesting refill.  She states several people in her family had similar ulcers.  Past Medical History:  Diagnosis Date   Anxiety    Anxiety and depression    Chronic constipation    Depression    GERD (gastroesophageal reflux disease)    History of hiatal hernia    History of migraine headaches    Seasonal allergic rhinitis    SUI (stress urinary incontinence, female)    Ureteral obstruction, right    Wears contact lenses    Past Surgical History:  Procedure Laterality Date   ANTERIOR CERVICAL DECOMP/DISCECTOMY FUSION  03-10-2004   C4 -- C6   BREAST BIOPSY Right 1980   BREAST EXCISIONAL BIOPSY Right 1980   COLONOSCOPY WITH PROPOFOL  03-08-2009   CYSTOSCOPY W/ URETERAL STENT PLACEMENT Right 10/02/2014   Procedure: CYSTOSCOPY WITH RIGHT  RETROGRADE PYELOGRAM , URETERAL STENT PLACEMENT;  Surgeon: Garnett Farm, MD;  Location: [facility];  Service: Urology;  Laterality: Right;   ESOPHAGOGASTRODUODENOSCOPY  04-02-2002   TOTAL HIP ARTHROPLASTY  03/03/2012   Procedure: TOTAL HIP ARTHROPLASTY;  Surgeon: [name], MD;  Location: [facility];  Service: Orthopedics;  Laterality: Left;   TUBAL LIGATION  1971   VAGINAL HYSTERECTOMY  1976    reports that she quit smoking about 23 years ago. Her smoking use included cigarettes. She has a 10.00 pack-year smoking history. She has never used smokeless tobacco. She reports current alcohol use of about 1.0 standard drink of alcohol per week. She reports that she does not use drugs. family history includes Breast cancer in her maternal uncle; Cancer (age of onset: 72) in her father; Cirrhosis in her mother. Allergies  Allergen Reactions   Codeine Nausea Only   Hydrocodone Nausea And Vomiting and Other (See Comments)    "get dizzy and spaced out" Other reaction(s): Other (See Comments) "get dizzy and spaced out"    Review of Systems  Constitutional:  Negative for chills, fever and malaise/fatigue.  Cardiovascular:  Negative for chest pain.  Gastrointestinal:  Positive for diarrhea. Negative for abdominal pain, blood in stool, constipation, melena, nausea and vomiting.  Genitourinary:  Negative for hematuria.      Objective:  BP 96/60 (BP Location: Left Arm, Patient Position: Sitting, Cuff Size: Normal)   Pulse (!) 110   Temp 98.1 F (36.7 C) (Oral)   Ht 5' 3.78" (1.62 m)   Wt 123 lb 6.4 oz (56 kg)   SpO2 96%   BMI 21.33 kg/m  BP Readings from Last 3 Encounters:  04/18/23 96/60  01/05/23 130/82  12/12/22 (!) 148/82   Wt Readings from Last 3 Encounters:  04/18/23 123 lb 6.4 oz (56 kg)  01/05/23 128 lb 12.8 oz (58.4 kg)  12/09/22 132 lb 15 oz (60.3 kg)      Physical Exam Vitals reviewed.  Constitutional:      Appearance: She is well-developed.   Cardiovascular:     Rate and Rhythm: Normal rate and regular rhythm.  Pulmonary:     Effort: Pulmonary effort is normal.     Breath sounds: Normal breath sounds.  Abdominal:     Comments: Soft and nontender.  Rectal exam reveals normal sphincter tone.  No rectal mass.  Hemoccult negative.  She does have some external hemorrhoids but no thrombosis and no active bleeding.  Neurological:     Mental Status: She is alert.      No results found for any visits on 04/18/23.    The 10-year ASCVD risk score (Arnett DK, et al., 2019) is: 12.2%    Assessment & Plan:   #1 intermittent stool seepage and frequent early morning loose stools especially after breakfast.  No known history of lactose intolerance.  She tried lactose-free milk without improvement.  No history of gluten sensitivity.  Mild recent weight loss of uncertain significance.  Recent CBC and CMP normal.  Reported history of IBS.  -Make sure not taking any further Linzess -Consider trial of over-the-counter Metamucil, FiberCon, or Citrucel -If not seeing improvement in loose stools with the above consider short-term trial of cholestyramine -Recommend GI referral if not improving with the above -Follow-up immediately for any bloody stools or other new symptoms  #2 history of recurrent aphthous ulcers.  Prescribe refill of triamcinolone 0.1% dental paste to use twice daily as needed  No follow-ups on file.    Evelena Peat, MD

## 2023-04-18 NOTE — Patient Instructions (Signed)
Try daily fiber supplement with Metamucil, Fibercon, or Citrucel    If loose/watery stools not resolving in 1-2 weeks let me know and we might try Cholestyramine powder.  We also need to consider GI consult if not improving with the above.

## 2023-05-02 ENCOUNTER — Other Ambulatory Visit: Payer: Self-pay | Admitting: Adult Health

## 2023-05-02 ENCOUNTER — Other Ambulatory Visit: Payer: Self-pay | Admitting: Family

## 2023-05-02 DIAGNOSIS — F5101 Primary insomnia: Secondary | ICD-10-CM

## 2023-05-02 NOTE — Telephone Encounter (Signed)
Okay for refill?  

## 2023-05-07 ENCOUNTER — Ambulatory Visit: Payer: Medicare HMO | Admitting: Family Medicine

## 2023-06-06 NOTE — Telephone Encounter (Signed)
Prolia VOB initiated via [email or web address]  Next Prolia inj DUE: 07/12/23

## 2023-06-13 NOTE — Telephone Encounter (Signed)
Per [email or web address], pt's policy has termed.   Please reach out to pt to update insurance information.

## 2023-06-18 DIAGNOSIS — M7061 Trochanteric bursitis, right hip: Secondary | ICD-10-CM | POA: Diagnosis not present

## 2023-06-18 DIAGNOSIS — M7062 Trochanteric bursitis, left hip: Secondary | ICD-10-CM | POA: Diagnosis not present

## 2023-07-06 ENCOUNTER — Telehealth: Payer: Self-pay

## 2023-07-06 NOTE — Telephone Encounter (Signed)
Unsuccessful attempt to reach patient on preferred number listed in notes for scheduled AWV. Left message on voicemail okay to reschedule.

## 2023-07-09 NOTE — Telephone Encounter (Signed)
I spoke with pt- She now has [employer]. I called [employer] and spoke with Hailey. Re-verification of Prolia benefits submitted.

## 2023-07-10 NOTE — Telephone Encounter (Signed)
Pt ready for scheduling on or after: 07/12/23   Out-of-pocket cost due at time of visit: $345  Primary: Medicare Prolia co-insurance: 20% (approximately $320) Admin fee co-insurance: 20% (approximately $25)  Deductible: Does not apply  Secondary:  Prolia co-insurance:  Admin fee co-insurance:   Prior Auth: Approved PA# [id number] Valid: 07/11/23 to 10/23/23  ** This summary of benefits is an estimation of the patient's out-of-pocket cost. Exact cost may vary based on individual plan coverage.

## 2023-07-11 NOTE — Telephone Encounter (Signed)
[employer] PA form completed and faxed. [employer] PA is approved. See below. Ref # [id number].

## 2023-07-11 NOTE — Progress Notes (Unsigned)
07/12/2023 Kim Daniel [id number] 1945/11/07  Referring provider: Kristian Covey, MD Primary GI doctor: Dr. Leonides Schanz  ASSESSMENT AND PLAN:   Change in bowel habit over 3 months with loose stools in the morning with associated abdominal pain nausea and weakness Will check C. Difficile with Diatherix since patient had recent antibiotics this little bit more doubtful Will check sed rate to evaluate for inflammation and CBC to evaluate for leukocytosis. Patient had CT abdomen pelvis February no need for repeat cross-sectional imaging at this time Patient's last colonoscopy was 2011, I do not have the report however with change in bowel habits that is affecting quality of life and patient's good health she is interested in pursuing colonoscopy Will schedule for colonoscopy to rule out occult malignancy, microscopic colitis Start on fiber supplements, FODMAP diet given, is able to take half to hold Imodium at night or in the morning prior to eating. Consider pancreatic elastase but patient does have any issues later in the day and recent CT without any pancreatic abnormalities If this is negative can consider empiric treatment for SIBO    Patient Care Team: Kristian Covey, MD as PCP - General (Family Medicine)  HISTORY OF PRESENT ILLNESS: 77 y.o. female with a past medical history of GERD, IBS, fecal urgency and others listed below presents for evaluation of abdominal pain and diarrhea with incontinence of feces.   11/11/2009 colonoscopy Dr.  Loreta Ave  2018 s/p hemorrhoid banding with Dr. Kinnie Scales 12/09/2022 CT abdomen pelvis with contrast for bowel obstruction suspected right lower quadrant pain and dysuria showed persistent marked dilation right renal pelvis with mild enhancement consistent with chronic UPJ obstruction potential superimposed infection.  Moderate extrahepatic biliary ductal dilation similar to comparison exam 01/05/2023 labs show no anemia, normal LFTs, negative  hepatitis C  04/18/2023 office visit with primary care Dr. Sander Radon for abdominal pain and diarrhea.  She has had AB pain and change in bowel habits since June.  She states she will have hard formed stools that can be black/dark at first in the morning with urgency and then water afterwards, can have fecal incontinence. Has had 2-3 times, loose large volume.  No further episodes later in the day or at night.  Will sit on the toliet for an hour, and then she will have to have another BM after 5 more mins.  Worse after eating, she has not had any food this morning due to fear.  She will have periumbilical AB pain, non radiating after her BM.  She has nausea with it, no vomiting.  Will feel weak and trembling.  No hematochezia.  She denies GERD, dysphagia.  No changes in medications, no sick contacts.  Patient was on large doses of antibiotics in February for urethral infection/pyelonephritis.  She denies blood thinner use.  She denies NSAID use.  She reports ETOH use, small glass red wine nightly.  She denies tobacco use.  She denies drug use.    She  reports that she quit smoking about 23 years ago. Her smoking use included cigarettes. She started smoking about 43 years ago. She has a 10 pack-year smoking history. She has never used smokeless tobacco. She reports current alcohol use of about 1.0 standard drink of alcohol per week. She reports that she does not use drugs.  RELEVANT LABS AND IMAGING: CBC    Component Value Date/Time   WBC 6.2 01/05/2023 1000   RBC 4.54 01/05/2023 1000   HGB 13.5 01/05/2023 1000   HCT  39.8 01/05/2023 1000   PLT 263.0 01/05/2023 1000   MCV 87.7 01/05/2023 1000   MCH 29.1 12/12/2022 0449   MCHC 34.0 01/05/2023 1000   RDW 14.1 01/05/2023 1000   LYMPHSABS 2.0 01/05/2023 1000   MONOABS 0.6 01/05/2023 1000   EOSABS 0.1 01/05/2023 1000   BASOSABS 0.0 01/05/2023 1000   Recent Labs    12/09/22 1136 12/09/22 2118 12/10/22 0436 12/11/22 0415  12/12/22 0449 01/05/23 1000  HGB 13.4 13.0 11.8* 11.4* 12.2 13.5    CMP     Component Value Date/Time   NA 140 01/05/2023 1000   K 4.1 01/05/2023 1000   CL 107 01/05/2023 1000   CO2 25 01/05/2023 1000   GLUCOSE 80 01/05/2023 1000   BUN 12 01/05/2023 1000   CREATININE 0.76 01/05/2023 1000   CALCIUM 9.0 01/05/2023 1000   PROT 6.5 01/05/2023 1000   ALBUMIN 4.0 01/05/2023 1000   AST 23 01/05/2023 1000   ALT 12 01/05/2023 1000   ALKPHOS 28 (L) 01/05/2023 1000   BILITOT 0.6 01/05/2023 1000   GFRNONAA >60 12/12/2022 0449   GFRAA >60 05/26/2018 1107      Latest Ref Rng & Units 01/05/2023   10:00 AM 12/11/2022    9:02 AM 12/09/2022   11:36 AM  Hepatic Function  Total Protein 6.0 - 8.3 g/dL 6.5   6.9   Albumin 3.5 - 5.2 g/dL 4.0  3.2  4.1   AST 0 - 37 U/L 23   20   ALT 0 - 35 U/L 12   12   Alk Phosphatase 39 - 117 U/L 28   33   Total Bilirubin 0.2 - 1.2 mg/dL 0.6   1.3       Current Medications:   Current Outpatient Medications (Endocrine & Metabolic):    denosumab (PROLIA) 60 MG/ML SOSY injection, Inject 60 mg into the skin every 3 (three) months. (Patient not taking: Reported on 07/12/2023)   Current Outpatient Medications (Respiratory):    fluticasone (FLONASE) 50 MCG/ACT nasal spray, PLACE 2 SPRAYS INTO BOTH NOSTRILS DAILY  Current Outpatient Medications (Analgesics):    acetaminophen (TYLENOL) 325 MG tablet, Take 2 tablets (650 mg total) by mouth every 6 (six) hours as needed for mild pain (or Fever >/= 101).   naproxen (NAPROSYN) 375 MG tablet, Take 375 mg by mouth daily as needed for moderate pain.   SUMAtriptan (IMITREX) 100 MG tablet, TAKE 1 TABLET BY MOUTH AT ONSET OF MIGRAINE. MAY REPEAT DOSE IN 1 HOUR IF HEADACHE NOT RESOLVED   Current Outpatient Medications (Other):    Calcium Carbonate-Vitamin D (CALTRATE 600+D PO), Take 1 tablet by mouth 3 (three) times a week.   citalopram (CELEXA) 40 MG tablet, TAKE ONE TABLET BY MOUTH ONCE DAILY   clonazePAM (KLONOPIN)  0.5 MG tablet, Take 0.5 mg by mouth at bedtime.   Na Sulfate-K Sulfate-Mg Sulf 17.5-3.13-1.6 GM/177ML SOLN, Take 1 kit by mouth once for 1 dose.   pantoprazole (PROTONIX) 40 MG tablet, TAKE 1 TABLET BY MOUTH ONCE DAILY (Patient taking differently: Take 40 mg by mouth daily.)   triamcinolone (KENALOG) 0.1 % paste, Use as directed 1 Application in the mouth or throat 2 (two) times daily.   zolpidem (AMBIEN) 5 MG tablet, TAKE ONE TABLET BY MOUTH AT BEDTIME AS NEEDED FOR SLEEP.  Medical History:  Past Medical History:  Diagnosis Date   Anxiety    Anxiety and depression    Chronic constipation    Depression    GERD (gastroesophageal reflux  disease)    History of hiatal hernia    History of migraine headaches    Seasonal allergic rhinitis    SUI (stress urinary incontinence, female)    Ureteral obstruction, right    Wears contact lenses    Allergies:  Allergies  Allergen Reactions   Codeine Nausea Only   Hydrocodone Nausea And Vomiting and Other (See Comments)    "get dizzy and spaced out" Other reaction(s): Other (See Comments) "get dizzy and spaced out"     Surgical History:  She  has a past surgical history that includes Total hip arthroplasty (03/03/2012); Esophagogastroduodenoscopy (04-02-2002); Anterior cervical decomp/discectomy fusion (03-10-2004); Colonoscopy with propofol (03-08-2009); Vaginal hysterectomy (1976); Tubal ligation (1971); Cystoscopy w/ ureteral stent placement (Right, 10/02/2014); Breast biopsy (Right, 1980); and Breast excisional biopsy (Right, 1980). Family History:  Her family history includes Breast cancer in her maternal uncle; Cancer (age of onset: 5) in her father; Cirrhosis in her mother.  REVIEW OF SYSTEMS  : All other systems reviewed and negative except where noted in the History of Present Illness.  PHYSICAL EXAM: BP 110/70   Pulse 72   Ht 5\' 3"  (1.6 m)   Wt 126 lb (57.2 kg)   BMI 22.32 kg/m  General Appearance: Well nourished, in no  apparent distress. Head:   Normocephalic and atraumatic. Eyes:  sclerae anicteric,conjunctive pink  Respiratory: Respiratory effort normal, BS equal bilaterally without rales, rhonchi, wheezing. Cardio: RRR with no MRGs. Peripheral pulses intact.  Abdomen: Soft,  Non-distended ,active bowel sounds. No tenderness . Without guarding and Without rebound. No masses. Rectal: Normal external rectal exam, normal rectal tone, appreciated internal hemorrhoids, non-tender, no masses, scant stool in rectal vault no constipation, hemoccult Negative Musculoskeletal: Full ROM, Normal gait. Without edema. Skin:  Dry and intact without significant lesions or rashes Neuro: Alert and  oriented x4;  No focal deficits. Psych:  Cooperative. Normal mood and affect.    Doree Albee, PA-C 12:31 PM

## 2023-07-12 ENCOUNTER — Encounter: Payer: Self-pay | Admitting: Physician Assistant

## 2023-07-12 ENCOUNTER — Other Ambulatory Visit (INDEPENDENT_AMBULATORY_CARE_PROVIDER_SITE_OTHER): Payer: Medicare HMO

## 2023-07-12 ENCOUNTER — Ambulatory Visit: Payer: Medicare HMO | Admitting: Physician Assistant

## 2023-07-12 VITALS — BP 110/70 | HR 72 | Ht 63.0 in | Wt 126.0 lb

## 2023-07-12 DIAGNOSIS — R194 Change in bowel habit: Secondary | ICD-10-CM

## 2023-07-12 DIAGNOSIS — R1033 Periumbilical pain: Secondary | ICD-10-CM | POA: Diagnosis not present

## 2023-07-12 DIAGNOSIS — R197 Diarrhea, unspecified: Secondary | ICD-10-CM

## 2023-07-12 LAB — COMPREHENSIVE METABOLIC PANEL
ALT: 10 U/L (ref 0–35)
AST: 20 U/L (ref 0–37)
Albumin: 4.5 g/dL (ref 3.5–5.2)
Alkaline Phosphatase: 34 U/L — ABNORMAL LOW (ref 39–117)
BUN: 15 mg/dL (ref 6–23)
CO2: 28 mEq/L (ref 19–32)
Calcium: 9.5 mg/dL (ref 8.4–10.5)
Chloride: 104 mEq/L (ref 96–112)
Creatinine, Ser: 0.89 mg/dL (ref 0.40–1.20)
GFR: 62.37 mL/min (ref >60.00)
Glucose, Bld: 94 mg/dL (ref 70–99)
Potassium: 4 mEq/L (ref 3.5–5.1)
Sodium: 141 mEq/L (ref 135–145)
Total Bilirubin: 1 mg/dL (ref 0.2–1.2)
Total Protein: 7 g/dL (ref 6.0–8.3)

## 2023-07-12 LAB — SEDIMENTATION RATE: Sed Rate: 4 mm/hr (ref 0–30)

## 2023-07-12 LAB — CBC WITH DIFFERENTIAL/PLATELET
Basophils Absolute: 0.1 10*3/uL (ref 0.0–0.1)
Basophils Relative: 0.8 % (ref 0.0–3.0)
Eosinophils Absolute: 0.1 10*3/uL (ref 0.0–0.7)
Eosinophils Relative: 1 % (ref 0.0–5.0)
HCT: 41.5 % (ref 36.0–46.0)
Hemoglobin: 14 g/dL (ref 12.0–15.0)
Lymphocytes Relative: 32 % (ref 12.0–46.0)
Lymphs Abs: 2.3 10*3/uL (ref 0.7–4.0)
MCHC: 33.7 g/dL (ref 30.0–36.0)
MCV: 90 fl (ref 78.0–100.0)
Monocytes Absolute: 0.7 10*3/uL (ref 0.1–1.0)
Monocytes Relative: 9.9 % (ref 3.0–12.0)
Neutro Abs: 4 10*3/uL (ref 1.4–7.7)
Neutrophils Relative %: 56.3 % (ref 43.0–77.0)
Platelets: 232 10*3/uL (ref 150.0–400.0)
RBC: 4.61 Mil/uL (ref 3.87–5.11)
RDW: 13.6 % (ref 11.5–15.5)
WBC: 7.1 10*3/uL (ref 4.0–10.5)

## 2023-07-12 MED ORDER — NA SULFATE-K SULFATE-MG SULF 17.5-3.13-1.6 GM/177ML PO SOLN: 1.0000 | Freq: Once | ORAL | 0 refills | Status: AC

## 2023-07-12 NOTE — Patient Instructions (Addendum)
Your provider has requested that you go to the basement level for lab work before leaving today. Press "B" on the elevator. The lab is located at the first door on the left as you exit the elevator.   FIBER SUPPLEMENT You can do metamucil or fibercon once or twice a day but if this causes gas/bloating please switch to Benefiber or Citracel.  Fiber is good for constipation/diarrhea/irritable bowel syndrome.  It can also help with weight loss and can help lower your bad cholesterol (LDL).  Please do 1 TBSP in the morning in water, coffee, or tea.  It can take up to a month before you can see a difference with your bowel movements.  It is cheapest from [employer], [employer], [employer].    - Drink a lot of liquids that have water, salt, and sugar. Good choices are water mixed with juice, flavored soda, and soup broth. If you are drinking enough, your urine will be light yellow or almost clear.  - Try to eat a little food. Good choices are potatoes, noodles, rice, oatmeal, crackers, bananas, soup, and boiled vegetables.  - Avoid high fat foods, as they can make diarrhea worse.  - Dairy products (except yogurt) may be difficult to digest when you have diarrhea. I recommend that you temporarily avoid lactose-containing foods.  - Can try loperamide 4 mg initially, then 2 mg after each unformed stool for =2 days, with a maximum of 16 mg/day.  - If loperamide is not working, you could try bismuth salicylate (Pepto-Bismol) 30 mL or two tablets every 30 minutes for eight doses. Pepto-Bismol may make your stools black.   Go to the ER if any severe abdominal pain, fever, or weakness    FODMAP stands for fermentable oligo-, di-, mono-saccharides and polyols (1). These are the scientific terms used to classify groups of carbs that are difficult for our body to digest and that are notorious for triggering digestive symptoms like bloating, gas, loose stools and stomach pain.   You can try low FODMAP diet  - start with  eliminating just one column at a time that you feel may be a trigger for you. - the table at the very bottom contains foods that are low in FODMAPs   Sometimes trying to eliminate the FODMAP's from your diet is difficult or tricky, if you are stuggling with trying to do the elimination diet you can try an enzyme.  There is a food enzymes that you sprinkle in or on your food that helps break down the FODMAP. You can read more about the enzyme by going to this site: https://fodzyme.com/    You have been scheduled for a colonoscopy. Please follow written instructions given to you at your visit today.   Please pick up your prep supplies at the pharmacy within the next 1-3 days.  If you use inhalers (even only as needed), please bring them with you on the day of your procedure.  DO NOT TAKE 7 DAYS PRIOR TO TEST- Trulicity (dulaglutide) Ozempic, Wegovy (semaglutide) Mounjaro (tirzepatide) Bydureon Bcise (exanatide extended release)  DO NOT TAKE 1 DAY PRIOR TO YOUR TEST Rybelsus (semaglutide) Adlyxin (lixisenatide) Victoza (liraglutide) Byetta (exanatide) ___________________________________________________________________________   _______________________________________________________  If your blood pressure at your visit was 140/90 or greater, please contact your primary care physician to follow up on this.  _______________________________________________________  If you are age 78 or older, your body mass index should be between 23-30. Your Body mass index is 22.32 kg/m. If this is out of  the aforementioned range listed, please consider follow up with your Primary Care Provider.  If you are age 78 or younger, your body mass index should be between 19-25. Your Body mass index is 22.32 kg/m. If this is out of the aformentioned range listed, please consider follow up with your Primary Care Provider.   ________________________________________________________  The [facility]  providers would like to encourage you to use [facility] to communicate with providers for non-urgent requests or questions.  Due to long hold times on the telephone, sending your provider a message by [facility] may be a faster and more efficient way to get a response.  Please allow 48 business hours for a response.  Please remember that this is for non-urgent requests.  _______________________________________________________ It was a pleasure to see you today!  Thank you for trusting me with your gastrointestinal care!

## 2023-07-16 NOTE — Progress Notes (Signed)
I agree with the assessment and plan as outlined by Ms. Steffanie Dunn. Recent biliary ductal dilation is noted but appears to be stable since 2019 based upon a comparison CT scan. Total bilirubin in 11/2022 (time of last CT scan) was elevated but this may be due to cholestasis of sepsis due to pyelonephritis at that time.

## 2023-07-17 ENCOUNTER — Telehealth: Payer: Self-pay | Admitting: Physician Assistant

## 2023-07-17 NOTE — Telephone Encounter (Signed)
[employer] is negative for Cdiff and GI pathogen panel Continue with colonoscopy

## 2023-07-20 ENCOUNTER — Encounter: Payer: Self-pay | Admitting: Internal Medicine

## 2023-07-31 ENCOUNTER — Telehealth: Payer: Self-pay | Admitting: Physician Assistant

## 2023-07-31 DIAGNOSIS — N3001 Acute cystitis with hematuria: Secondary | ICD-10-CM | POA: Diagnosis not present

## 2023-07-31 DIAGNOSIS — R300 Dysuria: Secondary | ICD-10-CM | POA: Diagnosis not present

## 2023-07-31 DIAGNOSIS — R350 Frequency of micturition: Secondary | ICD-10-CM | POA: Diagnosis not present

## 2023-07-31 NOTE — Telephone Encounter (Signed)
Per Dr Leonides Schanz: If patient is already on antibiotics for her UTI and is having improvement in symptoms, I am okay with proceeding with the colonoscopy procedure. However if the patient prefers to reschedule, I am also okay with that.    I spoke with Kim Daniel and she said she feels "rotten" and wants to r/s. She was at the drug store and she will call to r/s when she gets home so I left the appointment on the books so the girls can r/s off that.

## 2023-07-31 NOTE — Telephone Encounter (Signed)
Inbound call from patient stating that she had a UTI flare up and is supposed to have a colonoscopy on 10/10 and has been put on antibiotics. Patient is requesting a call to discuss is she can still proceed with her procedure. Please advise.

## 2023-07-31 NOTE — Telephone Encounter (Signed)
PT has rescheduled colonoscopy and needs to have new instructions sent to her. Please advise.

## 2023-07-31 NOTE — Telephone Encounter (Signed)
Please advise Dr Leonides Schanz, thank you.

## 2023-08-01 NOTE — Telephone Encounter (Signed)
She has been sent new instructions.

## 2023-08-02 ENCOUNTER — Encounter: Payer: Medicare HMO | Admitting: Internal Medicine

## 2023-08-14 DIAGNOSIS — R1031 Right lower quadrant pain: Secondary | ICD-10-CM | POA: Diagnosis not present

## 2023-08-20 ENCOUNTER — Encounter: Payer: Self-pay | Admitting: Physician Assistant

## 2023-08-20 ENCOUNTER — Other Ambulatory Visit: Payer: Self-pay | Admitting: Family Medicine

## 2023-08-20 DIAGNOSIS — F5101 Primary insomnia: Secondary | ICD-10-CM

## 2023-08-28 NOTE — Telephone Encounter (Signed)
Prolia PA is approved 10/24/23 to 10/22/24. [employer] Ref #: [id number].

## 2023-09-03 ENCOUNTER — Encounter: Payer: Self-pay | Admitting: Internal Medicine

## 2023-09-05 ENCOUNTER — Encounter: Payer: Medicare HMO | Admitting: Internal Medicine

## 2023-09-28 DIAGNOSIS — J0140 Acute pansinusitis, unspecified: Secondary | ICD-10-CM | POA: Diagnosis not present

## 2023-09-28 DIAGNOSIS — H6993 Unspecified Eustachian tube disorder, bilateral: Secondary | ICD-10-CM | POA: Diagnosis not present

## 2023-09-28 DIAGNOSIS — R051 Acute cough: Secondary | ICD-10-CM | POA: Diagnosis not present

## 2023-09-28 DIAGNOSIS — B9689 Other specified bacterial agents as the cause of diseases classified elsewhere: Secondary | ICD-10-CM | POA: Diagnosis not present

## 2023-09-28 DIAGNOSIS — Z20822 Contact with and (suspected) exposure to covid-19: Secondary | ICD-10-CM | POA: Diagnosis not present

## 2023-09-28 DIAGNOSIS — J988 Other specified respiratory disorders: Secondary | ICD-10-CM | POA: Diagnosis not present

## 2023-10-02 ENCOUNTER — Encounter (INDEPENDENT_AMBULATORY_CARE_PROVIDER_SITE_OTHER): Payer: Medicare Other | Admitting: Family Medicine

## 2023-10-02 NOTE — Progress Notes (Signed)
Per staff patient could not do visit now and requested to reschedule.

## 2023-10-02 NOTE — Progress Notes (Signed)
"  Patient was unable to self-report due to a lack of equipment at home via telehealth"

## 2023-10-09 DIAGNOSIS — Z1231 Encounter for screening mammogram for malignant neoplasm of breast: Secondary | ICD-10-CM | POA: Diagnosis not present

## 2023-10-09 DIAGNOSIS — Z6822 Body mass index (BMI) 22.0-22.9, adult: Secondary | ICD-10-CM | POA: Diagnosis not present

## 2023-10-11 DIAGNOSIS — J069 Acute upper respiratory infection, unspecified: Secondary | ICD-10-CM | POA: Diagnosis not present

## 2023-10-15 ENCOUNTER — Other Ambulatory Visit: Payer: Self-pay | Admitting: Family Medicine

## 2023-10-22 ENCOUNTER — Other Ambulatory Visit: Payer: Self-pay | Admitting: Family Medicine

## 2023-10-27 NOTE — Telephone Encounter (Signed)
 Per visit note with GYN 06/2023:  She has a history of osteoporosis. Had been on Boniva. Was subsequently placed on Evenity  and is now on Prolia . This is done through Dr. Shermon. Last bone density was done in December of last year that showed osteopenia at all sites. She gets blood work done through her primary care doctor.      Pt archived in [email or web address].  Please advise if patient and/or provider wish to proceed with Prolia  therpay.

## 2023-10-30 ENCOUNTER — Encounter: Payer: Self-pay | Admitting: Nurse Practitioner

## 2023-10-30 ENCOUNTER — Ambulatory Visit: Payer: Medicare Other | Admitting: Nurse Practitioner

## 2023-10-30 ENCOUNTER — Other Ambulatory Visit (HOSPITAL_COMMUNITY): Payer: Self-pay

## 2023-10-30 VITALS — BP 106/64 | HR 72 | Ht 63.0 in | Wt 127.1 lb

## 2023-10-30 DIAGNOSIS — K644 Residual hemorrhoidal skin tags: Secondary | ICD-10-CM

## 2023-10-30 DIAGNOSIS — R194 Change in bowel habit: Secondary | ICD-10-CM

## 2023-10-30 MED ORDER — HYDROCORTISONE (PERIANAL) 2.5 % EX CREA: 1.0000 | TOPICAL_CREAM | Freq: Two times a day (BID) | CUTANEOUS | 1 refills | Status: DC

## 2023-10-30 NOTE — Progress Notes (Signed)
 I agree with the assessment and plan as outlined by [name].

## 2023-10-30 NOTE — Progress Notes (Signed)
 ASSESSMENT    Brief Narrative:  78 y.o.  female known to Dr. Federico ( transferred care from Dr. Luis)  with a past medical history not limited to anxiety, migraines  Chronically altered bowel habits ( at least 2 years).  After breakfast she passes a formed stool followed by a large amount of liquid stool. It is possible that her underlying issue could actually be constipation.   External hemorrhoids.  Causing discomfort.   See PMH for any additional medical & surgical history   PLAN   -Trial of Anusol  cream - apply inside rectum and on the external hemorrhoids twice daily for 10 days --Trial Benfiber 1-2 times daily. Recommended Benefiber as it may help absorb some of the liquids portion of BM --Reschedule the recent colonoscopy ordered by Alan Coombs, PA. Patient had to cancel due to family issues.    HPI   Chief complaint : uncontrollable bowel movemens  Patient establish care here 07/12/2023.  She was seen by Alan Coombs, PA for evaluation of abdominal pain and diarrhea,. Please refer to that note for details.  In summary, Alan recommended  fiber supplements, low FODMAP diet and a colonoscopy.  Her last colonoscopy was in 2011   Labs including a CBC, CMET and ESR were unremarkable Appears a stool test for C-diff was meant to be ordered but wasn't.  Patient had to cancel colonoscopy due to some family issues.   Interval History:  Ambrie has returned with concerns for uncontrollable diarrhea in the mornings. She is trying to follow the low FODMAP diet but didn't start the fiber supplement. Generally after breakfast she has an urgent need to have a BM. She will then pass solid stool followed by a large amount of liquid stool .  Following this she feels lightheaded. No further bowel movements throughout the day .  This has been her bowel pattern for at least 2 years. Additionally, her hemorrhoids are hurting. No blood in stool.       Latest Ref Rng & Units 07/12/2023    12:00 PM 01/05/2023   10:00 AM 12/11/2022    9:02 AM  Hepatic Function  Total Protein 6.0 - 8.3 g/dL 7.0  6.5    Albumin 3.5 - 5.2 g/dL 4.5  4.0  3.2   AST 0 - 37 U/L 20  23    ALT 0 - 35 U/L 10  12    Alk Phosphatase 39 - 117 U/L 34  28    Total Bilirubin 0.2 - 1.2 mg/dL 1.0  0.6         Latest Ref Rng & Units 07/12/2023   12:00 PM 01/05/2023   10:00 AM 12/12/2022    4:49 AM  CBC  WBC 4.0 - 10.5 K/uL 7.1  6.2  5.7   Hemoglobin 12.0 - 15.0 g/dL 85.9  86.4  87.7   Hematocrit 36.0 - 46.0 % 41.5  39.8  38.2   Platelets 150.0 - 400.0 K/uL 232.0  263.0  220      Past Medical History:  Diagnosis Date   Anxiety    Anxiety and depression    Chronic constipation    Depression    GERD (gastroesophageal reflux disease)    History of hiatal hernia    History of migraine headaches    Seasonal allergic rhinitis    SUI (stress urinary incontinence, female)    Ureteral obstruction, right    Wears contact lenses     Past Surgical History:  Procedure  Laterality Date   ANTERIOR CERVICAL DECOMP/DISCECTOMY FUSION  03-10-2004   C4 -- C6   BREAST BIOPSY Right 1980   BREAST EXCISIONAL BIOPSY Right 1980   COLONOSCOPY WITH PROPOFOL   03-08-2009   CYSTOSCOPY W/ URETERAL STENT PLACEMENT Right 10/02/2014   Procedure: CYSTOSCOPY WITH RIGHT RETROGRADE PYELOGRAM , URETERAL STENT PLACEMENT;  Surgeon: Mark C Ottelin, MD;  Location: [facility];  Service: Urology;  Laterality: Right;   ESOPHAGOGASTRODUODENOSCOPY  04-02-2002   TOTAL HIP ARTHROPLASTY  03/03/2012   Procedure: TOTAL HIP ARTHROPLASTY;  Surgeon: Donnice JONETTA Car, MD;  Location: [facility];  Service: Orthopedics;  Laterality: Left;   TUBAL LIGATION  1971   VAGINAL HYSTERECTOMY  1976    Family History  Problem Relation Age of Onset   Cirrhosis Mother        non alcohol cirrhosis   Cancer Father 22       lung cancer   Breast cancer Maternal Uncle    Colon cancer Neg Hx    Stomach cancer Neg Hx    Esophageal cancer Neg Hx      Current Medications, Allergies, Family History and Social History were reviewed in [employer] record.     Current Outpatient Medications  Medication Sig Dispense Refill   acetaminophen  (TYLENOL ) 325 MG tablet Take 2 tablets (650 mg total) by mouth every 6 (six) hours as needed for mild pain (or Fever >/= 101).     Calcium Carbonate-Vitamin D  (CALTRATE 600+D PO) Take 1 tablet by mouth 3 (three) times a week.     citalopram  (CELEXA ) 40 MG tablet TAKE ONE TABLET BY MOUTH ONCE DAILY 90 tablet 0   clonazePAM  (KLONOPIN ) 0.5 MG tablet Take 0.5 mg by mouth at bedtime.     diclofenac  (VOLTAREN ) 75 MG EC tablet Take 75 mg by mouth as needed.     fluticasone  (FLONASE ) 50 MCG/ACT nasal spray PLACE 2 SPRAYS INTO BOTH NOSTRILS DAILY 48 g 3   hydrocortisone  (ANUSOL -HC) 2.5 % rectal cream Place 1 Application rectally 2 (two) times daily. For 10 days 30 g 1   naproxen (NAPROSYN) 375 MG tablet Take 375 mg by mouth daily as needed for moderate pain.     SUMAtriptan  (IMITREX ) 100 MG tablet TAKE 1 TABLET BY MOUTH AT ONSET OF MIGRAINE. MAY REPEAT DOSE IN 1 HOUR IF HEADACHE NOT RESOLVED 9 tablet 0   triamcinolone  (KENALOG ) 0.1 % paste Use as directed 1 Application in the mouth or throat 2 (two) times daily. 5 g 5   zolpidem  (AMBIEN ) 5 MG tablet TAKE ONE TABLET BY MOUTH AT BEDTIME AS NEEDED FOR SLEEP. 30 tablet 5   No current facility-administered medications for this visit.    Review of Systems: No chest pain. No shortness of breath. No urinary complaints.    Physical Exam  Filed Weights   10/30/23 1004  Weight: 127 lb 2 oz (57.7 kg)   Wt Readings from Last 3 Encounters:  10/30/23 127 lb 2 oz (57.7 kg)  07/12/23 126 lb (57.2 kg)  04/18/23 123 lb 6.4 oz (56 kg)    BP 106/64 (BP Location: Left Arm, Patient Position: Sitting, Cuff Size: Normal)   Pulse 72   Ht 5' 3 (1.6 m)   Wt 127 lb 2 oz (57.7 kg)   BMI 22.52 kg/m  Constitutional:  Pleasant, generally well appearing  female in no acute distress. Psychiatric: Normal mood and affect. Behavior is normal. EENT: Pupils normal.  Conjunctivae are normal. No scleral icterus. Neck supple.  Cardiovascular: Normal rate, regular rhythm.  Pulmonary/chest: Effort normal and breath sounds normal. No wheezing, rales or rhonchi. Abdominal: Soft, nondistended, nontender. Bowel sounds active throughout. There are no masses palpable. No hepatomegaly. Neurological: Alert and oriented to person place and time.    Vina Dasen, NP  10/30/2023, 10:21 AM  Cc:  Micheal Wolm ORN, MD

## 2023-10-30 NOTE — Patient Instructions (Signed)
 You have been scheduled for a colonoscopy. Please follow written instructions given to you at your visit today.   Please pick up your prep supplies at the pharmacy within the next 1-3 days.  If you use inhalers (even only as needed), please bring them with you on the day of your procedure.  DO NOT TAKE 7 DAYS PRIOR TO TEST- Trulicity (dulaglutide) Ozempic, Wegovy (semaglutide) Mounjaro (tirzepatide) Bydureon Bcise (exanatide extended release)  DO NOT TAKE 1 DAY PRIOR TO YOUR TEST Rybelsus (semaglutide) Adlyxin (lixisenatide) Victoza (liraglutide) Byetta (exanatide) ___________________________________________________________________________   Use Anusol  Cream apply inside of rectum and to external hemorrhoid twice a day for 10 days  Use Benefiber 1-2 times a day    Due to recent changes in healthcare laws, you may see the results of your imaging and laboratory studies on MyChart before your provider has had a chance to review them.  We understand that in some cases there may be results that are confusing or concerning to you. Not all laboratory results come back in the same time frame and the provider may be waiting for multiple results in order to interpret others.  Please give us  48 hours in order for your provider to thoroughly review all the results before contacting the office for clarification of your results.    I appreciate the  opportunity to care for you  Thank You   Vina Kerman PIETY

## 2023-11-01 DIAGNOSIS — M7062 Trochanteric bursitis, left hip: Secondary | ICD-10-CM | POA: Diagnosis not present

## 2023-11-01 DIAGNOSIS — M7061 Trochanteric bursitis, right hip: Secondary | ICD-10-CM | POA: Diagnosis not present

## 2023-11-06 ENCOUNTER — Ambulatory Visit: Payer: Self-pay | Admitting: Family Medicine

## 2023-11-06 NOTE — Telephone Encounter (Signed)
 Copied from CRM [id number]. Topic: Clinical - Red Word Triage >> Nov 06, 2023  8:12 AM Franky GRADE wrote: Red Word that prompted transfer to Nurse Triage: Patient is calling with congestion in her chest area, cough with mucus discharge , severe ear pain since Friday. Pain level is at a 9   Chief Complaint: Productive Cough x 4 days Symptoms: chest congestion, earache, body aches Frequency: worsening Pertinent Negatives: Patient denies fecer Disposition: [] ED /[] Urgent Care (no appt availability in office) / [x] Appointment(In office/virtual)/ []  [place] [facility]/ [] Home Care/ [] Refused Recommended Disposition /[] [facility] [facility]/ []  Follow-up with PCP Additional Notes: Patient reports worsening cough x 4 days. Also endorses earache, chest congestion, and body aches. Pt rrequesting to see her doctor specifically. Appt scheduled for 01/15 with Dr. Micheal Reason for Disposition  SEVERE coughing spells (e.g., whooping sound after coughing, vomiting after coughing)  Answer Assessment - Initial Assessment Questions 1. ONSET: When did the cough begin?      Started 4 days ago on Friday  2. SEVERITY: How bad is the cough today?      Feels bad, getting worse each day  3. SPUTUM: Describe the color of your sputum (none, dry cough; clear, white, yellow, green)     Yellow sputum  4. HEMOPTYSIS: Are you coughing up any blood? If so ask: How much? (flecks, streaks, tablespoons, etc.)     No blood  5. DIFFICULTY BREATHING: Are you having difficulty breathing? If Yes, ask: How bad is it? (e.g., mild, moderate, severe)    - MILD: No SOB at rest, mild SOB with walking, speaks normally in sentences, can lie down, no retractions, pulse < 100.    - MODERATE: SOB at rest, SOB with minimal exertion and prefers to sit, cannot lie down flat, speaks in phrases, mild retractions, audible wheezing, pulse 100-120.    - SEVERE: Very SOB at rest, speaks in single words, struggling to breathe,  sitting hunched forward, retractions, pulse > 120      Moderate shortness of breath, feels like she has to take deep breaths to cath cbreath  6. FEVER: Do you have a fever? If Yes, ask: What is your temperature, how was it measured, and when did it start?     No fever  7. CARDIAC HISTORY: Do you have any history of heart disease? (e.g., heart attack, congestive heart failure)      No cardiac hx  8. LUNG HISTORY: Do you have any history of lung disease?  (e.g., pulmonary embolus, asthma, emphysema)     No lung hx  9. PE RISK FACTORS: Do you have a history of blood clots? (or: recent major surgery, recent prolonged travel, bedridden)     No PE risk 10. OTHER SYMPTOMS: Do you have any other symptoms? (e.g., runny nose, wheezing, chest pain)       Chest congestion, Bilateral ear pain, body aches  11. PREGNANCY: Is there any chance you are pregnant? When was your last menstrual period?       N/a  12. TRAVEL: Have you traveled out of the country in the last month? (e.g., travel history, exposures)       No exposure  Protocols used: Cough - Acute Productive-A-AH

## 2023-11-07 ENCOUNTER — Encounter: Payer: Self-pay | Admitting: Family Medicine

## 2023-11-07 ENCOUNTER — Ambulatory Visit (INDEPENDENT_AMBULATORY_CARE_PROVIDER_SITE_OTHER): Payer: Medicare Other | Admitting: Family Medicine

## 2023-11-07 VITALS — BP 138/84 | HR 87 | Temp 98.4°F | Wt 125.1 lb

## 2023-11-07 DIAGNOSIS — R062 Wheezing: Secondary | ICD-10-CM

## 2023-11-07 DIAGNOSIS — R051 Acute cough: Secondary | ICD-10-CM

## 2023-11-07 MED ORDER — DOXYCYCLINE HYCLATE 100 MG PO CAPS: 100.0000 mg | ORAL_CAPSULE | Freq: Two times a day (BID) | ORAL | 0 refills | Status: DC

## 2023-11-07 MED ORDER — PREDNISONE 20 MG PO TABS: ORAL_TABLET | ORAL | 0 refills | Status: DC

## 2023-11-07 NOTE — Progress Notes (Signed)
 Established Patient Office Visit  Subjective   Patient ID: Kim Daniel, female    DOB: Apr 07, 1946  Age: 78 y.o. MRN: [id number]  Chief Complaint  Patient presents with   Cough    Patient complains of nasal congestion, x1 week,    Nasal Congestion    Patient complains of nasal congestion, x1 week    Headache    Patient complains of headache, Tried Ibuprofen     HPI   [name] is seen with approxi-1 week history of cough, nasal congestion, intermittent headache.  She did home COVID test on Sunday which was negative.  She is actually had a few respiratory infections over the past month or so.  She thinks this is a separate infection.  She has also had some bilateral ear fullness.  She has had thick yellow mucus nasally and by productive cough.  No documented fever.  She thinks she may be having some wheezing intermittently. Quit smoking around 2000  Past Medical History:  Diagnosis Date   Anxiety    Anxiety and depression    Chronic constipation    Depression    GERD (gastroesophageal reflux disease)    History of hiatal hernia    History of migraine headaches    Seasonal allergic rhinitis    SUI (stress urinary incontinence, female)    Ureteral obstruction, right    Wears contact lenses    Past Surgical History:  Procedure Laterality Date   ANTERIOR CERVICAL DECOMP/DISCECTOMY FUSION  03-10-2004   C4 -- C6   BREAST BIOPSY Right 1980   BREAST EXCISIONAL BIOPSY Right 1980   COLONOSCOPY WITH PROPOFOL   03-08-2009   CYSTOSCOPY W/ URETERAL STENT PLACEMENT Right 10/02/2014   Procedure: CYSTOSCOPY WITH RIGHT RETROGRADE PYELOGRAM , URETERAL STENT PLACEMENT;  Surgeon: Mark C Ottelin, MD;  Location: [facility] [place];  Service: Urology;  Laterality: Right;   ESOPHAGOGASTRODUODENOSCOPY  04-02-2002   TOTAL HIP ARTHROPLASTY  03/03/2012   Procedure: TOTAL HIP ARTHROPLASTY;  Surgeon: Bevin Bucks, MD;  Location: [facility];  Service: Orthopedics;  Laterality: Left;   TUBAL  LIGATION  1971   VAGINAL HYSTERECTOMY  1976    reports that she quit smoking about 24 years ago. Her smoking use included cigarettes. She started smoking about 44 years ago. She has a 10 pack-year smoking history. She has never used smokeless tobacco. She reports current alcohol use of about 1.0 standard drink of alcohol per week. She reports that she does not use drugs. family history includes Breast cancer in her maternal uncle; Cancer (age of onset: 22) in her father; Cirrhosis in her mother. Allergies  Allergen Reactions   Codeine Nausea Only   Hydrocodone  Nausea And Vomiting and Other (See Comments)    "get dizzy and spaced out" Other reaction(s): Other (See Comments) "get dizzy and spaced out"   Hydrocodone -Acetaminophen      Other Reaction(s): GI Intolerance    Review of Systems  Constitutional:  Positive for malaise/fatigue. Negative for fever.  HENT:  Positive for congestion. Negative for sore throat.   Respiratory:  Positive for cough, sputum production and wheezing. Negative for hemoptysis.       Objective:     BP 138/84 (BP Location: Left Arm, Patient Position: Sitting, Cuff Size: Normal)   Pulse 87   Temp 98.4 F (36.9 C) (Oral)   Wt 125 lb 1.6 oz (56.7 kg)   SpO2 96%   BMI 22.16 kg/m  BP Readings from Last 3 Encounters:  11/07/23 138/84  10/30/23  106/64  07/12/23 110/70   Wt Readings from Last 3 Encounters:  11/07/23 125 lb 1.6 oz (56.7 kg)  10/30/23 127 lb 2 oz (57.7 kg)  07/12/23 126 lb (57.2 kg)      Physical Exam Vitals reviewed.  Constitutional:      General: She is not in acute distress.    Appearance: She is not ill-appearing.  HENT:     Mouth/Throat:     Mouth: Mucous membranes are moist.     Pharynx: Oropharynx is clear.  Cardiovascular:     Rate and Rhythm: Normal rate and regular rhythm.  Pulmonary:     Effort: Pulmonary effort is normal.     Comments: She does have a few faint expiratory wheezes in both bases.  No rales.  No  retractions.  Pulse oximetry 96% room air. Musculoskeletal:     Cervical back: Neck supple. No rigidity.  Lymphadenopathy:     Cervical: No cervical adenopathy.  Neurological:     Mental Status: She is alert.      No results found for any visits on 11/07/23.    The 10-year ASCVD risk score (Arnett DK, et al., 2019) is: 25.9%    Assessment & Plan:   Upper respiratory infection with cough.  She does have evidence for mild reactive airway component on exam.  No respiratory distress. She is describing some thick purulent secretions nasally  -Prednisone  20 mg 2 tablets daily for 5 days -Consider doxycycline  100 mg twice daily for 7 days -She will consider over-the-counter Flonase  for nasal symptoms. -Follow-up for any fever or worsening symptoms -  Glean Lamy, MD

## 2023-11-07 NOTE — Patient Instructions (Signed)
Try over the counter Flonase for nasal congestion

## 2023-11-12 DIAGNOSIS — R030 Elevated blood-pressure reading, without diagnosis of hypertension: Secondary | ICD-10-CM | POA: Diagnosis not present

## 2023-11-12 DIAGNOSIS — J449 Chronic obstructive pulmonary disease, unspecified: Secondary | ICD-10-CM | POA: Diagnosis not present

## 2023-11-12 DIAGNOSIS — Z0130 Encounter for examination of blood pressure without abnormal findings: Secondary | ICD-10-CM | POA: Diagnosis not present

## 2023-11-12 DIAGNOSIS — R059 Cough, unspecified: Secondary | ICD-10-CM | POA: Diagnosis not present

## 2023-11-12 DIAGNOSIS — B338 Other specified viral diseases: Secondary | ICD-10-CM | POA: Diagnosis not present

## 2023-11-13 ENCOUNTER — Telehealth: Payer: Self-pay | Admitting: Nurse Practitioner

## 2023-11-13 NOTE — Telephone Encounter (Signed)
Requesting to speak with a nurse in regards to upcoming procedure prep. Please advise.   Thank you

## 2023-11-19 NOTE — Telephone Encounter (Signed)
Tried to contact patient Left a message on her machine

## 2023-11-20 ENCOUNTER — Telehealth: Payer: Self-pay

## 2023-11-20 ENCOUNTER — Encounter: Payer: Self-pay | Admitting: Internal Medicine

## 2023-11-20 NOTE — Telephone Encounter (Signed)
Copied from CRM [id number]. Topic: General - Other >> Nov 20, 2023 10:11 AM Fredrich Romans wrote: Reason for CRM: patient stated that 3 or 4 years she received a cologuard,she would like to know if she's able to have that done again,instead of having the actual colonoscopy done.

## 2023-11-20 NOTE — Telephone Encounter (Signed)
Called and left a VM per DPR

## 2023-11-21 ENCOUNTER — Encounter: Payer: Self-pay | Admitting: Family Medicine

## 2023-11-21 ENCOUNTER — Ambulatory Visit (INDEPENDENT_AMBULATORY_CARE_PROVIDER_SITE_OTHER): Payer: Medicare Other | Admitting: Family Medicine

## 2023-11-21 VITALS — BP 120/70 | HR 84 | Temp 98.1°F | Wt 122.0 lb

## 2023-11-21 DIAGNOSIS — R9389 Abnormal findings on diagnostic imaging of other specified body structures: Secondary | ICD-10-CM | POA: Diagnosis not present

## 2023-11-21 DIAGNOSIS — R0600 Dyspnea, unspecified: Secondary | ICD-10-CM | POA: Diagnosis not present

## 2023-11-21 NOTE — Progress Notes (Unsigned)
Established Patient Office Visit  Subjective   Patient ID: Kim Daniel, female    DOB: [birth year]  Age: 78 y.o. MRN: [id number]  Chief Complaint  Patient presents with   Cough    HPI  {History (Optional):23778} Kim Daniel is seen for follow-up regarding recent RSV infection.  She actually came here on 15 January with cough and was noted to have a little bit of wheezing but no respiratory distress.  O2 sats 96%.  She was treated with prednisone and doxycycline but continued to get worse.  She went to [facility] the following Monday and had chest x-ray which showed no pneumonia.  She did have RSV screen which came back positive.  She was negative for COVID.  She was told that she had "COPD "changes on her chest x-ray.  She was prescribed initially Anoro and then subsequently Wixela inhalers.  She took the Anoro and saw some mild benefit.  She feels much better at this time.  Cough is essentially resolved.  No wheezing.  Still has occasional dyspnea with activity.  She quit smoking about 21 years ago.  Smoked about 10 years but states she was a very light smoker usually only about 5 cigarettes/day. She states even prior to getting her recent respiratory illness she had some mild dyspnea with exertion.  No chest pain.  No chronic cough.  No chronic mucus production.  Denies any active reflux symptoms.  Past Medical History:  Diagnosis Date   Anxiety    Anxiety and depression    Chronic constipation    Depression    GERD (gastroesophageal reflux disease)    History of hiatal hernia    History of migraine headaches    Seasonal allergic rhinitis    SUI (stress urinary incontinence, female)    Ureteral obstruction, right    Wears contact lenses    Past Surgical History:  Procedure Laterality Date   ANTERIOR CERVICAL DECOMP/DISCECTOMY FUSION  03-10-2004   C4 -- C6   BREAST BIOPSY Right 1980   BREAST EXCISIONAL BIOPSY Right 1980   COLONOSCOPY WITH PROPOFOL  03-08-2009    CYSTOSCOPY W/ URETERAL STENT PLACEMENT Right 10/02/2014   Procedure: CYSTOSCOPY WITH RIGHT RETROGRADE PYELOGRAM , URETERAL STENT PLACEMENT;  Surgeon: Garnett Farm, MD;  Location: [facility];  Service: Urology;  Laterality: Right;   ESOPHAGOGASTRODUODENOSCOPY  04-02-2002   TOTAL HIP ARTHROPLASTY  03/03/2012   Procedure: TOTAL HIP ARTHROPLASTY;  Surgeon: [name], MD;  Location: [facility];  Service: Orthopedics;  Laterality: Left;   TUBAL LIGATION  1971   VAGINAL HYSTERECTOMY  1976    reports that she quit smoking about 24 years ago. Her smoking use included cigarettes. She started smoking about 44 years ago. She has a 10 pack-year smoking history. She has never used smokeless tobacco. She reports current alcohol use of about 1.0 standard drink of alcohol per week. She reports that she does not use drugs. family history includes Breast cancer in her maternal uncle; Cancer (age of onset: 45) in her father; Cirrhosis in her mother. Allergies  Allergen Reactions   Codeine Nausea Only   Hydrocodone Nausea And Vomiting and Other (See Comments)    "get dizzy and spaced out" Other reaction(s): Other (See Comments) "get dizzy and spaced out"   Hydrocodone-Acetaminophen     Other Reaction(s): GI Intolerance    Review of Systems  Constitutional:  Negative for chills and fever.  Respiratory:  Positive for shortness of breath. Negative for hemoptysis  and wheezing.   Cardiovascular:  Negative for chest pain.      Objective:     BP 120/70 (BP Location: Left Arm, Patient Position: Sitting, Cuff Size: Normal)   Pulse 84   Temp 98.1 F (36.7 C) (Oral)   Wt 122 lb (55.3 kg)   SpO2 98%   BMI 21.61 kg/m  BP Readings from Last 3 Encounters:  11/21/23 120/70  11/07/23 138/84  10/30/23 106/64   Wt Readings from Last 3 Encounters:  11/21/23 122 lb (55.3 kg)  11/07/23 125 lb 1.6 oz (56.7 kg)  10/30/23 127 lb 2 oz (57.7 kg)      Physical Exam Vitals reviewed.   Constitutional:      General: She is not in acute distress.    Appearance: She is not ill-appearing.  Cardiovascular:     Rate and Rhythm: Normal rate and regular rhythm.  Pulmonary:     Effort: Pulmonary effort is normal. No respiratory distress.     Breath sounds: Normal breath sounds. No wheezing or rales.  Musculoskeletal:     Right lower leg: No edema.     Left lower leg: No edema.  Neurological:     Mental Status: She is alert.      No results found for any visits on 11/21/23.  Last CBC Lab Results  Component Value Date   WBC 7.1 07/12/2023   HGB 14.0 07/12/2023   HCT 41.5 07/12/2023   MCV 90.0 07/12/2023   MCH 29.1 12/12/2022   RDW 13.6 07/12/2023   PLT 232.0 07/12/2023   Last metabolic panel Lab Results  Component Value Date   GLUCOSE 94 07/12/2023   NA 141 07/12/2023   K 4.0 07/12/2023   CL 104 07/12/2023   CO2 28 07/12/2023   BUN 15 07/12/2023   CREATININE 0.89 07/12/2023   GFR 62.37 07/12/2023   CALCIUM 9.5 07/12/2023   PHOS 2.1 (L) 12/11/2022   PROT 7.0 07/12/2023   ALBUMIN 4.5 07/12/2023   BILITOT 1.0 07/12/2023   ALKPHOS 34 (L) 07/12/2023   AST 20 07/12/2023   ALT 10 07/12/2023   ANIONGAP 5 12/12/2022      The 10-year ASCVD risk score (Arnett DK, et al., 2019) is: 20.4%    Assessment & Plan:   78 year old female with recent reported RSV infection.  Clinically improved at this time.  Cough improving and wheezing has resolved.  Still some very mild dyspnea intermittently with exertion.  Patient concerned regarding report of "COPD changes "on chest x-ray.  Smoked only briefly and quit 21 years ago.  Less than 5-pack-year history.  -Patient requesting consideration for PFTs to help further clarify and this seems reasonable. -Set up pulmonary referral to further evaluate -Continue annual flu vaccine -Pneumonia vaccines complete -Follow-up promptly for any increased shortness of breath, cough, or other concerns   No follow-ups on file.     Evelena Peat, MD

## 2023-11-21 NOTE — Patient Instructions (Signed)
Let me know if you have not heard from pulmonary in 2-3 weeks.

## 2023-11-29 ENCOUNTER — Encounter: Payer: Medicare Other | Admitting: Internal Medicine

## 2023-12-03 ENCOUNTER — Telehealth: Payer: Self-pay | Admitting: Internal Medicine

## 2023-12-03 NOTE — Telephone Encounter (Signed)
 I spoke with the patient and told her Dr. Auston Left can not order any test on her until he sees her in the office and feels like it is medically necessary for her to have them done.  Nothing further needed.

## 2023-12-03 NOTE — Telephone Encounter (Signed)
 PT is coming in for an appt in March on a referral. The referring Dr. Dore Gambler she have a PFT and a CRX. Please put in an order and get PT sched with Dr. Landa Pine approval of course. TY. Her # is [phone]

## 2023-12-25 ENCOUNTER — Encounter: Payer: Self-pay | Admitting: Internal Medicine

## 2023-12-25 ENCOUNTER — Ambulatory Visit: Payer: Medicare Other | Admitting: Internal Medicine

## 2023-12-25 VITALS — BP 106/64 | HR 75 | Temp 97.6°F | Ht 63.0 in | Wt 124.2 lb

## 2023-12-25 DIAGNOSIS — Z87891 Personal history of nicotine dependence: Secondary | ICD-10-CM | POA: Diagnosis not present

## 2023-12-25 DIAGNOSIS — R0602 Shortness of breath: Secondary | ICD-10-CM

## 2023-12-25 DIAGNOSIS — J449 Chronic obstructive pulmonary disease, unspecified: Secondary | ICD-10-CM

## 2023-12-25 DIAGNOSIS — R059 Cough, unspecified: Secondary | ICD-10-CM | POA: Diagnosis not present

## 2023-12-25 NOTE — Progress Notes (Signed)
 [facility] Consultation      Date: 12/25/2023,   MRN# [id number] Kim Daniel 1946-09-29     CHIEF COMPLAINT:   Assessment for COPD   HISTORY OF PRESENT ILLNESS   78 year old female with recent reported RSV infection January 2025 Clinically improved at this time. Cough improving and wheezing has resolved.  Still some very mild dyspnea intermittently with exertion.  With RSV infection, patient was given antibiotics and prednisone   Patient concerned regarding report of "COPD changes "on chest x-ray. Smoked only briefly and quit 21 years ago. Less than 5-pack-year history Patient was referred to [place] for further assessment She was given inhaled therapy with [facility] this has helped her symptoms significantly  Patient prior to her RSV infection had intermittent shortness of breath and wheezing Patient had previously smoked but quit 21 years ago Patient with previous pneumonia 5 years ago and then 7 years ago  No exacerbation at this time No evidence of heart failure at this time No evidence or signs of infection at this time No respiratory distress No fevers, chills, nausea, vomiting, diarrhea No evidence of lower extremity edema No evidence hemoptysis   Chest x-ray January 2025 reviewed in detail with patient No significant effusions, patient with tortuous trachea There is no evidence of clinical diaphragms no evidence of hyperinflation  PAST MEDICAL HISTORY   Past Medical History:  Diagnosis Date   Anxiety    Anxiety and depression    Chronic constipation    Depression    GERD (gastroesophageal reflux disease)    History of hiatal hernia    History of migraine headaches    Seasonal allergic rhinitis    SUI (stress urinary incontinence, female)    Ureteral obstruction, right    Wears contact lenses      SURGICAL HISTORY   Past Surgical History:  Procedure Laterality Date   ANTERIOR CERVICAL DECOMP/DISCECTOMY FUSION  03-10-2004   C4  -- C6   BREAST BIOPSY Right 1980   BREAST EXCISIONAL BIOPSY Right 1980   COLONOSCOPY WITH PROPOFOL  03-08-2009   CYSTOSCOPY W/ URETERAL STENT PLACEMENT Right 10/02/2014   Procedure: CYSTOSCOPY WITH RIGHT RETROGRADE PYELOGRAM , URETERAL STENT PLACEMENT;  Surgeon: Garnett Farm, MD;  Location: [facility] [place];  Service: Urology;  Laterality: Right;   ESOPHAGOGASTRODUODENOSCOPY  04-02-2002   TOTAL HIP ARTHROPLASTY  03/03/2012   Procedure: TOTAL HIP ARTHROPLASTY;  Surgeon: [name], MD;  Location: [facility];  Service: Orthopedics;  Laterality: Left;   TUBAL LIGATION  1971   VAGINAL HYSTERECTOMY  1976     FAMILY HISTORY   Family History  Problem Relation Age of Onset   Cirrhosis Mother        non alcohol cirrhosis   Cancer Father 26       lung cancer   Breast cancer Maternal Uncle    Colon cancer Neg Hx    Stomach cancer Neg Hx    Esophageal cancer Neg Hx      SOCIAL HISTORY   Social History   Tobacco Use   Smoking status: Former    Current packs/day: 0.00    Average packs/day: 0.5 packs/day for 20.0 years (10.0 ttl pk-yrs)    Types: Cigarettes    Start date: 10/02/1979    Quit date: 10/02/1999    Years since quitting: 24.2   Smokeless tobacco: Never  Vaping Use   Vaping status: Never Used  Substance Use Topics   Alcohol use: Yes    Alcohol/week:  1.0 standard drink of alcohol    Types: 1 Glasses of wine per week    Comment: occaional   Drug use: No     MEDICATIONS    Home Medication:  Current Outpatient Rx   Order #: [id number] Class: OTC   Order #: [id number] Class: Historical Med   Order #: [id number] Class: Normal   Order #: [id number] Class: Historical Med   Order #: [id number] Class: Normal   Order #: [id number] Class: Normal   Order #: [id number] Class: Historical Med   Order #: [id number] Class: Normal   Order #: [id number] Class: Normal   Order #: [id number] Class: Normal    Current Medication:  Current Outpatient Medications:    acetaminophen  (TYLENOL) 325 MG tablet, Take 2 tablets (650 mg total) by mouth every 6 (six) hours as needed for mild pain (or Fever >/= 101)., Disp: , Rfl:    Calcium Carbonate-Vitamin D (CALTRATE 600+D PO), Take 1 tablet by mouth 3 (three) times a week., Disp: , Rfl:    citalopram (CELEXA) 40 MG tablet, TAKE ONE TABLET BY MOUTH ONCE DAILY, Disp: 90 tablet, Rfl: 0   clonazePAM (KLONOPIN) 0.5 MG tablet, Take 0.5 mg by mouth at bedtime., Disp: , Rfl:    fluticasone (FLONASE) 50 MCG/ACT nasal spray, PLACE 2 SPRAYS INTO BOTH NOSTRILS DAILY, Disp: 48 g, Rfl: 3   hydrocortisone (ANUSOL-HC) 2.5 % rectal cream, Place 1 Application rectally 2 (two) times daily. For 10 days, Disp: 30 g, Rfl: 1   naproxen (NAPROSYN) 375 MG tablet, Take 375 mg by mouth daily as needed for moderate pain., Disp: , Rfl:    SUMAtriptan (IMITREX) 100 MG tablet, TAKE 1 TABLET BY MOUTH AT ONSET OF MIGRAINE. MAY REPEAT DOSE IN 1 HOUR IF HEADACHE NOT RESOLVED, Disp: 9 tablet, Rfl: 0   triamcinolone (KENALOG) 0.1 % paste, Use as directed 1 Application in the mouth or throat 2 (two) times daily., Disp: 5 g, Rfl: 5   zolpidem (AMBIEN) 5 MG tablet, TAKE ONE TABLET BY MOUTH AT BEDTIME AS NEEDED FOR SLEEP., Disp: 30 tablet, Rfl: 5    ALLERGIES   Codeine, Hydrocodone, and Hydrocodone-acetaminophen     REVIEW OF SYSTEMS    Review of Systems:  Gen:  Denies  fever, sweats, chills weigh loss  HEENT: Denies blurred vision, double vision, ear pain, eye pain, hearing loss, nose bleeds, sore throat Cardiac:  No dizziness, chest pain or heaviness, chest tightness,edema Resp:   Denies cough or sputum porduction, +shortness of breath,+wheezing, hemoptysis,  Gi: Denies swallowing difficulty, stomach pain, nausea or vomiting, diarrhea, constipation, bowel incontinence Gu:  Denies bladder incontinence, burning urine Ext:   Denies Joint pain, stiffness or swelling Skin: Denies  skin rash, easy bruising or bleeding or hives Endoc:  Denies polyuria,  polydipsia , polyphagia or weight change Psych:   Denies depression, insomnia or hallucinations   Other:  All other systems negative   VS: BP 106/64 (BP Location: Left Arm, Patient Position: Sitting, Cuff Size: Normal)   Pulse 75   Temp 97.6 F (36.4 C) (Temporal)   Ht 5\' 3"  (1.6 m)   Wt 124 lb 3.2 oz (56.3 kg)   SpO2 98%   BMI 22.00 kg/m      PHYSICAL EXAM  General Appearance: No distress  EYES PERRLA, EOM intact.   NECK Supple, No JVD Pulmonary: normal breath sounds, No wheezing.  CardiovascularNormal S1,S2.  No m/r/g.   Abdomen: Benign, Soft, non-tender. Skin:   warm, no rashes, no ecchymosis  Extremities: normal, no cyanosis, clubbing. Neuro:without focal findings,  speech normal  PSYCHIATRIC: Mood, affect within normal limits.   ALL OTHER ROS ARE NEGATIVE     ASSESSMENT/PLAN   78 year old pleasant white female seen today for recent RSV viral infection, underwent treatment with antibiotics and prednisone and was told that she had COPD and upon further assessment and discussion with the patient she shortness of breath and intermittent wheezing prior to her RSV infection along with some shortness of breath with exertion, with a history of previous smoking exposure and previous pneumonia patient may have underlying COPD.   Assessment of COPD At this time patient does not need any maintenance therapy however will use combination LABA LAMA for inhalers and use as needed I will obtain pulmonary function testing to assess lung function  Avoid Allergens and Irritants Avoid secondhand smoke Avoid SICK contacts Recommend  Masking  when appropriate Recommend Keep up-to-date with vaccinations    MEDICATION ADJUSTMENTS/LABS AND TESTS ORDERED: Continue to use Anoro Inhaler as needed Obtain pulmonary function testing to assess for lung function Avoid Allergens and Irritants Avoid secondhand smoke Avoid SICK contacts Recommend  Masking  when appropriate Recommend Keep  up-to-date with vaccinations   CURRENT MEDICATIONS REVIEWED AT LENGTH WITH PATIENT TODAY   Patient  satisfied with Plan of action and management. All questions answered  Follow up 3 months  I spent a total of 61 minutes reviewing chart data, face-to-face evaluation with the patient, counseling and coordination of care as detailed above.     Lucie Leather, M.D.  [facility]  Medical Director [facility] [place] Medical Director [facility] Cardio-Pulmonary Department

## 2023-12-25 NOTE — Patient Instructions (Signed)
 Continue to use Anoro Inhaler as needed Obtain pulmonary function testing to assess for lung function  Avoid Allergens and Irritants Avoid secondhand smoke Avoid SICK contacts Recommend  Masking  when appropriate Recommend Keep up-to-date with vaccinations

## 2023-12-31 DIAGNOSIS — H02059 Trichiasis without entropian unspecified eye, unspecified eyelid: Secondary | ICD-10-CM | POA: Diagnosis not present

## 2023-12-31 DIAGNOSIS — H1045 Other chronic allergic conjunctivitis: Secondary | ICD-10-CM | POA: Diagnosis not present

## 2023-12-31 DIAGNOSIS — Z961 Presence of intraocular lens: Secondary | ICD-10-CM | POA: Diagnosis not present

## 2024-01-07 ENCOUNTER — Other Ambulatory Visit: Payer: Self-pay | Admitting: Family Medicine

## 2024-01-15 ENCOUNTER — Encounter: Payer: Self-pay | Admitting: Family Medicine

## 2024-01-15 ENCOUNTER — Ambulatory Visit: Admitting: Family Medicine

## 2024-01-15 VITALS — BP 110/70 | HR 84 | Temp 98.1°F | Ht 63.0 in | Wt 125.8 lb

## 2024-01-15 DIAGNOSIS — Z711 Person with feared health complaint in whom no diagnosis is made: Secondary | ICD-10-CM | POA: Diagnosis not present

## 2024-01-15 DIAGNOSIS — M79604 Pain in right leg: Secondary | ICD-10-CM | POA: Diagnosis not present

## 2024-01-15 DIAGNOSIS — E538 Deficiency of other specified B group vitamins: Secondary | ICD-10-CM | POA: Diagnosis not present

## 2024-01-15 DIAGNOSIS — Z8379 Family history of other diseases of the digestive system: Secondary | ICD-10-CM | POA: Diagnosis not present

## 2024-01-15 DIAGNOSIS — M79605 Pain in left leg: Secondary | ICD-10-CM

## 2024-01-15 NOTE — Progress Notes (Signed)
 Established Patient Office Visit  Subjective   Patient ID: Kim Daniel, female    DOB: 04/21/1946  Age: 78 y.o. MRN: [id number]  Chief Complaint  Patient presents with   Leg Pain    Bilateral leg pain, hurts more in the morning, patient is taking diclofenac 75mg  for the discomfort    Memory Loss    HPI   Patient seen with approximately 6 week history of bilateral leg pain from lower thigh region down toward the legs.  No numbness or weakness.  No claudication symptoms.  Denies any low back pain.  Achy quality.  Has taken diclofenac with some benefit.  Patient quit smoking 2000 but no history of known peripheral vascular disease.  No history of diabetes.  She also has concerns regarding memory loss.  Having difficulty with things like names.  Does have a history of B12 deficiency.  Last lab for B12 was low  couple years ago.  She took B12 replacement briefly but currently not on supplement.  Patient relates her mom had a history of liver failure.  Apparently a grandparent also with liver failure as well as some other aunts and uncles.  She was told there was some sort of genetic issue.  She does not have the name.  Past Medical History:  Diagnosis Date   Anxiety    Anxiety and depression    Chronic constipation    Depression    GERD (gastroesophageal reflux disease)    History of hiatal hernia    History of migraine headaches    Seasonal allergic rhinitis    SUI (stress urinary incontinence, female)    Ureteral obstruction, right    Wears contact lenses    Past Surgical History:  Procedure Laterality Date   ANTERIOR CERVICAL DECOMP/DISCECTOMY FUSION  03-10-2004   C4 -- C6   BREAST BIOPSY Right 1980   BREAST EXCISIONAL BIOPSY Right 1980   COLONOSCOPY WITH PROPOFOL  03-08-2009   CYSTOSCOPY W/ URETERAL STENT PLACEMENT Right 10/02/2014   Procedure: CYSTOSCOPY WITH RIGHT RETROGRADE PYELOGRAM , URETERAL STENT PLACEMENT;  Surgeon: Garnett Farm, MD;  [facility];  Service: Urology;  Laterality: Right;   ESOPHAGOGASTRODUODENOSCOPY  04-02-2002   TOTAL HIP ARTHROPLASTY  03/03/2012   Procedure: TOTAL HIP ARTHROPLASTY;  Surgeon: [name], MD;  Location: [facility];  Service: Orthopedics;  Laterality: Left;   TUBAL LIGATION  1971   VAGINAL HYSTERECTOMY  1976    reports that she quit smoking about 24 years ago. Her smoking use included cigarettes. She started smoking about 44 years ago. She has a 10 pack-year smoking history. She has never used smokeless tobacco. She reports current alcohol use of about 1.0 standard drink of alcohol per week. She reports that she does not use drugs. family history includes Breast cancer in her maternal uncle; Cancer (age of onset: 80) in her father; Cirrhosis in her mother. Allergies  Allergen Reactions   Codeine Nausea Only   Hydrocodone Nausea And Vomiting and Other (See Comments)    "get dizzy and spaced out" Other reaction(s): Other (See Comments) "get dizzy and spaced out"   Hydrocodone-Acetaminophen     Other Reaction(s): GI Intolerance    Review of Systems  Constitutional:  Negative for chills, fever and malaise/fatigue.  Eyes:  Negative for blurred vision.  Respiratory:  Negative for shortness of breath.   Cardiovascular:  Negative for chest pain.  Musculoskeletal:  Negative for back pain.  Neurological:  Negative for dizziness, weakness  and headaches.      Objective:     BP 110/70 (BP Location: Left Arm, Patient Position: Sitting, Cuff Size: Normal)   Pulse 84   Temp 98.1 F (36.7 C) (Oral)   Ht 5\' 3"  (1.6 m)   Wt 125 lb 12.8 oz (57.1 kg)   SpO2 96%   BMI 22.28 kg/m  BP Readings from Last 3 Encounters:  01/15/24 110/70  12/25/23 106/64  11/21/23 120/70   Wt Readings from Last 3 Encounters:  01/15/24 125 lb 12.8 oz (57.1 kg)  12/25/23 124 lb 3.2 oz (56.3 kg)  11/21/23 122 lb (55.3 kg)      Physical Exam Vitals reviewed.  Constitutional:      General: She is not in  acute distress.    Appearance: She is well-developed. She is not ill-appearing.  Eyes:     Pupils: Pupils are equal, round, and reactive to light.  Neck:     Thyroid: No thyromegaly.     Vascular: No JVD.  Cardiovascular:     Rate and Rhythm: Normal rate and regular rhythm.     Heart sounds:     No gallop.     Comments: 2+ posterior tibial and dorsalis pedis pulses bilaterally.  Feet are warm with good capillary refill. Pulmonary:     Effort: Pulmonary effort is normal. No respiratory distress.     Breath sounds: Normal breath sounds. No wheezing or rales.  Musculoskeletal:     Cervical back: Neck supple.     Right lower leg: No edema.     Left lower leg: No edema.  Neurological:     Mental Status: She is alert.     Comments: Full strength lower extremities.  Symmetric reflexes.      No results found for any visits on 01/15/24.  Last CBC Lab Results  Component Value Date   WBC 7.1 07/12/2023   HGB 14.0 07/12/2023   HCT 41.5 07/12/2023   MCV 90.0 07/12/2023   MCH 29.1 12/12/2022   RDW 13.6 07/12/2023   PLT 232.0 07/12/2023   Last metabolic panel Lab Results  Component Value Date   GLUCOSE 94 07/12/2023   NA 141 07/12/2023   K 4.0 07/12/2023   CL 104 07/12/2023   CO2 28 07/12/2023   BUN 15 07/12/2023   CREATININE 0.89 07/12/2023   GFR 62.37 07/12/2023   CALCIUM 9.5 07/12/2023   PHOS 2.1 (L) 12/11/2022   PROT 7.0 07/12/2023   ALBUMIN 4.5 07/12/2023   BILITOT 1.0 07/12/2023   ALKPHOS 34 (L) 07/12/2023   AST 20 07/12/2023   ALT 10 07/12/2023   ANIONGAP 5 12/12/2022   Last lipids Lab Results  Component Value Date   CHOL 197 01/05/2023   HDL 78.50 01/05/2023   LDLCALC 109 (H) 01/05/2023   LDLDIRECT 115.3 02/19/2012   TRIG 49.0 01/05/2023   CHOLHDL 3 01/05/2023   Last hemoglobin A1c No results found for: "HGBA1C" Last thyroid functions Lab Results  Component Value Date   TSH 2.32 07/27/2021   Last vitamin B12 and Folate Lab Results  Component  Value Date   VITAMINB12 201 (L) 07/27/2021      The 10-year ASCVD risk score (Arnett DK, et al., 2019) is: 17.5%    Assessment & Plan:   #1 bilateral leg pain.  No evidence to suggest claudication.  Etiology unclear.  No statin use.  No edema.  Pain is bilateral and not localized.  No associated weakness.  She has gotten benefit from diclofenac  which suggest inflammatory origin.  We have cautioned her against regular use of diclofenac Suggested trial of vitamin D 2000 international units once daily  #2 history of B12 deficiency.  Certainly could be contributing to her memory issues.  We have recommend she get back on daily B12 1000 mcg and follow-up in 2 months and recheck B12 level then  #3 family history of nonalcoholic cirrhosis.  Patient unsure regarding exact etiology of her mother cirrhosis and several other family members.  We have asked that she try to track down this information.  She is not aware of any family history of hemochromatosis  Evelena Peat, MD

## 2024-01-15 NOTE — Patient Instructions (Signed)
 Confirm your mother's diagnosis regarding the liver disease.   Start OTC B12 1,000 mcg daily  Vit D 2,000 international units daily.  Try to use the Diclofenac sparingly.

## 2024-01-31 DIAGNOSIS — M7061 Trochanteric bursitis, right hip: Secondary | ICD-10-CM | POA: Diagnosis not present

## 2024-01-31 DIAGNOSIS — M7062 Trochanteric bursitis, left hip: Secondary | ICD-10-CM | POA: Diagnosis not present

## 2024-02-04 ENCOUNTER — Other Ambulatory Visit: Payer: Self-pay | Admitting: Family Medicine

## 2024-02-04 DIAGNOSIS — F5101 Primary insomnia: Secondary | ICD-10-CM

## 2024-02-06 ENCOUNTER — Other Ambulatory Visit: Payer: Self-pay | Admitting: Family Medicine

## 2024-02-06 DIAGNOSIS — F5101 Primary insomnia: Secondary | ICD-10-CM

## 2024-02-06 NOTE — Telephone Encounter (Signed)
 Last Fill: 08/21/23  Last OV: 01/15/24 Next OV: 02/28/24  Routing to provider for review/authorization.   Copied from CRM [id number]. Topic: Clinical - Medication Question >> Feb 06, 2024  3:40 PM [place] E wrote: Reason for CRM: Larinda Plover from the [facility] wanted a status on the patient's zolpidem (AMBIEN) 5 MG tablet refill request. Callback number for Larinda Plover is [phone].

## 2024-02-11 MED ORDER — ZOLPIDEM TARTRATE 5 MG PO TABS
5.0000 mg | ORAL_TABLET | Freq: Every evening | ORAL | 5 refills | Status: DC | PRN
Start: 2024-02-11 — End: 2024-08-29

## 2024-02-19 DIAGNOSIS — R1031 Right lower quadrant pain: Secondary | ICD-10-CM | POA: Diagnosis not present

## 2024-02-19 DIAGNOSIS — N1330 Unspecified hydronephrosis: Secondary | ICD-10-CM | POA: Diagnosis not present

## 2024-02-22 ENCOUNTER — Ambulatory Visit: Admitting: Family Medicine

## 2024-02-25 ENCOUNTER — Encounter: Payer: Self-pay | Admitting: Family Medicine

## 2024-02-25 ENCOUNTER — Ambulatory Visit (INDEPENDENT_AMBULATORY_CARE_PROVIDER_SITE_OTHER): Admitting: Family Medicine

## 2024-02-25 VITALS — BP 130/74 | HR 75 | Temp 98.2°F | Wt 122.7 lb

## 2024-02-25 DIAGNOSIS — R1031 Right lower quadrant pain: Secondary | ICD-10-CM

## 2024-02-25 DIAGNOSIS — K5909 Other constipation: Secondary | ICD-10-CM | POA: Diagnosis not present

## 2024-02-25 MED ORDER — LINACLOTIDE 290 MCG PO CAPS: 290.0000 ug | ORAL_CAPSULE | Freq: Every day | ORAL | 5 refills | Status: AC

## 2024-02-25 NOTE — Progress Notes (Signed)
 Established Patient Office Visit  Subjective   Patient ID: Kim Daniel, female    DOB: 30-Mar-1946  Age: 78 y.o. MRN: [id number]  Chief Complaint  Patient presents with   Back Pain    Patient complains of back pain, x3 weeks, Tried Aleve    Abdominal Pain    Patient complains of right sided abdominal pain,x3 weeks     HPI   Patient is seen with few week history of right lower quadrant abdominal pain.  She was concerned because February 2024 she had admission for pyelonephritis with ureteropelvic junction obstruction.  Her location is right lower quadrant with only minimal flank pain.  She actually saw urologist a week ago and patient was unable to give initial urine specimen.  They did In-N-Out cath.  This looked relatively clear but apparently culture was sent.  She was just called earlier today recommend that she go ahead and start Cipro .  She has not had any fever or chills.  No nausea or vomiting.  No abdominal distention.  She has chronic constipation and has taken Linzess  in the past and requesting refill.  Sometimes goes up to a week without bowel movements.  No exacerbating or alleviating factors for her abdominal pain.  Rates current pain 7 out of 10.  Relatively constant.  She has had previous partial hysterectomy age 3.  Denies any recent bloody stools or diarrhea.  Last colonoscopy 2011.  No history of right-sided diverticulitis.  Past Medical History:  Diagnosis Date   Anxiety    Anxiety and depression    Chronic constipation    Depression    GERD (gastroesophageal reflux disease)    History of hiatal hernia    History of migraine headaches    Seasonal allergic rhinitis    SUI (stress urinary incontinence, female)    Ureteral obstruction, right    Wears contact lenses    Past Surgical History:  Procedure Laterality Date   ANTERIOR CERVICAL DECOMP/DISCECTOMY FUSION  03-10-2004   C4 -- C6   BREAST BIOPSY Right 1980   BREAST EXCISIONAL BIOPSY Right 1980    COLONOSCOPY WITH PROPOFOL   03-08-2009   CYSTOSCOPY W/ URETERAL STENT PLACEMENT Right 10/02/2014   Procedure: CYSTOSCOPY WITH RIGHT RETROGRADE PYELOGRAM , URETERAL STENT PLACEMENT;  Surgeon: Mark C Ottelin, MD;  Location: [facility];  Service: Urology;  Laterality: Right;   ESOPHAGOGASTRODUODENOSCOPY  04-02-2002   TOTAL HIP ARTHROPLASTY  03/03/2012   Procedure: TOTAL HIP ARTHROPLASTY;  Surgeon: Bevin Bucks, MD;  Location: [facility];  Service: Orthopedics;  Laterality: Left;   TUBAL LIGATION  1971   VAGINAL HYSTERECTOMY  1976    reports that she quit smoking about 24 years ago. Her smoking use included cigarettes. She started smoking about 44 years ago. She has a 10 pack-year smoking history. She has never used smokeless tobacco. She reports current alcohol use of about 1.0 standard drink of alcohol per week. She reports that she does not use drugs. family history includes Breast cancer in her maternal uncle; Cancer (age of onset: 15) in her father; Cirrhosis in her mother. Allergies  Allergen Reactions   Codeine Nausea Only   Hydrocodone  Nausea And Vomiting and Other (See Comments)    "get dizzy and spaced out" Other reaction(s): Other (See Comments) "get dizzy and spaced out"   Hydrocodone -Acetaminophen      Other Reaction(s): GI Intolerance    Review of Systems  Constitutional:  Negative for chills and fever.  Cardiovascular:  Negative for chest  pain.  Gastrointestinal:  Positive for abdominal pain and constipation. Negative for blood in stool, diarrhea, melena, nausea and vomiting.  Genitourinary:  Negative for dysuria.      Objective:     BP 130/74 (BP Location: Left Arm, Patient Position: Sitting, Cuff Size: Normal)   Pulse 75   Temp 98.2 F (36.8 C) (Oral)   Wt 122 lb 11.2 oz (55.7 kg)   SpO2 96%   BMI 21.74 kg/m  BP Readings from Last 3 Encounters:  02/25/24 130/74  01/15/24 110/70  12/25/23 106/64   Wt Readings from Last 3 Encounters:  02/25/24 122  lb 11.2 oz (55.7 kg)  01/15/24 125 lb 12.8 oz (57.1 kg)  12/25/23 124 lb 3.2 oz (56.3 kg)      Physical Exam Vitals reviewed.  Constitutional:      General: She is not in acute distress.    Appearance: She is not ill-appearing.  Cardiovascular:     Rate and Rhythm: Normal rate and regular rhythm.  Pulmonary:     Effort: Pulmonary effort is normal.     Breath sounds: Normal breath sounds.  Abdominal:     Comments: Bowel sounds.  Nondistended.  Soft with no significant reproducible tenderness.  No guarding or rebound.  No masses palpated.  Neurological:     Mental Status: She is alert.      No results found for any visits on 02/25/24.  Last CBC Lab Results  Component Value Date   WBC 7.1 07/12/2023   HGB 14.0 07/12/2023   HCT 41.5 07/12/2023   MCV 90.0 07/12/2023   MCH 29.1 12/12/2022   RDW 13.6 07/12/2023   PLT 232.0 07/12/2023   Last metabolic panel Lab Results  Component Value Date   GLUCOSE 94 07/12/2023   NA 141 07/12/2023   K 4.0 07/12/2023   CL 104 07/12/2023   CO2 28 07/12/2023   BUN 15 07/12/2023   CREATININE 0.89 07/12/2023   GFR 62.37 07/12/2023   CALCIUM 9.5 07/12/2023   PHOS 2.1 (L) 12/11/2022   PROT 7.0 07/12/2023   ALBUMIN 4.5 07/12/2023   BILITOT 1.0 07/12/2023   ALKPHOS 34 (L) 07/12/2023   AST 20 07/12/2023   ALT 10 07/12/2023   ANIONGAP 5 12/12/2022      The 10-year ASCVD risk score (Arnett DK, et al., 2019) is: 23.4%    Assessment & Plan:   Kim Daniel is seen with about 3-week history of right lower quadrant abdominal pain.  Past history of pyelonephritis.  Currently denies any dysuria.  Saw urologist recently and was called this morning reportedly with positive culture and to start Cipro .  She does not have any fever, chills, nausea, vomiting, or significant flank pain to suggest acute pyelonephritis currently.  Does have some chronic constipation and currently out of Linzess .  Nonacute abdomen.  No evidence clinically to suggest  appendicitis.  We suggested following  -Follow-up immediately for any fever or progressive abdominal pain or other new symptoms - Start Cipro  which was recommended by her urologist - Refill Linzess  290 mcg 1 daily for chronic constipation - Consider ultrasound or CT abdomen pelvis if pain not improving over the next week  Glean Lamy, MD

## 2024-02-27 ENCOUNTER — Telehealth: Payer: Self-pay

## 2024-02-27 NOTE — Telephone Encounter (Signed)
 Copied from CRM [id number]. Topic: Clinical - Medical Advice >> Feb 27, 2024  3:35 PM Adaysia C wrote: Reason for CRM: Patient called to ask PCP what will be the best option to see if she has a bowel blockage; patient has requested a follow up call to address this question #[phone]

## 2024-02-28 ENCOUNTER — Ambulatory Visit: Payer: Medicare Other | Admitting: Family Medicine

## 2024-02-28 ENCOUNTER — Encounter: Payer: Self-pay | Admitting: Family Medicine

## 2024-02-28 DIAGNOSIS — Z0000 Encounter for general adult medical examination without abnormal findings: Secondary | ICD-10-CM | POA: Diagnosis not present

## 2024-02-28 NOTE — Telephone Encounter (Signed)
 Patient informed of the results and voiced understanding. Patient reported when she is having a bowel movement it is very painful for her to strain. Patient reports no nausea or emesis. Patient also reports intense back pain while at rest. Patient also stated that she has not had a bowel movement in the past 2-3 days.

## 2024-02-28 NOTE — Progress Notes (Signed)
 PATIENT CHECK-IN and HEALTH RISK ASSESSMENT QUESTIONNAIRE:  -completed by phone/video for upcoming Medicare Preventive Visit  Pre-Visit Check-in: 1)Vitals (height, wt, BP, etc) - record in vitals section for visit on day of visit Request home vitals (wt, BP, etc.) and enter into vitals, THEN update Vital Signs SmartPhrase below at the top of the HPI. See below.  2)Review and Update Medications, Allergies PMH, Surgeries, Social history in Epic 3)Hospitalizations in the last year with date/reason? no  4)Review and Update Care Team (patient's specialists) in Epic 5) Complete PHQ9 in Epic  6) Complete Fall Screening in Epic 7)Review all Health Maintenance Due and order under PCP if not done.  Medicare Wellness Patient Questionnaire:  Answer theses question about your habits: How often do you have a drink containing alcohol?Daily  How many drinks containing alcohol do you have on a typical day when you are drinking?1 How often do you have six or more drinks on one occasion?Never Have you ever smoked?Yes  Quit date if applicable? 24 years   How many packs a day do/did you smoke? 1/2 pack  Do you use smokeless tobacco? No Do you use an illicit drugs?No On average, how many days per week do you engage in moderate to strenuous exercise (like a brisk walk)?no, walks twice a day for 20-30 minutes On average, how many minutes do you engage in exercise at this level? Does gardening  Typical breakfast: Varies  Typical lunch: Varies  Typical dinner:Varies - reports eats lots of veggies and does cook at home Typical snacks:Cookies   Beverages:  Tea   Answer theses question about your everyday activities: Can you perform most household chores? Yes  Are you deaf or have significant trouble hearing? No Do you feel that you have a problem with memory? Somewhat  Do you feel safe at home? Yes  Last dentist visit? 12/24 8. Do you have any difficulty performing your everyday activities? No Are you  having any difficulty walking, taking medications on your own, and or difficulty managing daily home needs?No Do you have difficulty walking or climbing stairs? No Do you have difficulty dressing or bathing?No Do you have difficulty doing errands alone such as visiting a doctor's office or shopping?No Do you currently have any difficulty preparing food and eating?No Do you currently have any difficulty using the toilet?No Do you have any difficulty managing your finances?No Do you have any difficulties with housekeeping of managing your housekeeping?No   Do you have Advanced Directives in place (Living Will, Healthcare Power or Attorney)?  Yes    Last eye Exam and location? 10/24   Do you currently use prescribed or non-prescribed narcotic or opioid pain medications? No  Do you have a history or close family history of breast, ovarian, tubal or peritoneal cancer or a family member with BRCA (breast cancer susceptibility 1 and 2) gene mutations? Yes aunt breast cancer   Nurse/Assistant Credentials/time stamp: Mg 2:15 pm     ----------------------------------------------------------------------------------------------------------------------------------------------------------------------------------------------------------------------  Because this visit was a virtual/telehealth visit, some criteria may be missing or patient reported. Any vitals not documented were not able to be obtained and vitals that have been documented are patient reported.    MEDICARE ANNUAL PREVENTIVE VISIT WITH PROVIDER: (Welcome to Medicare, initial annual wellness or annual wellness exam)  Virtual Visit via Phone Note  I connected with DAWNESHA MENKE on 02/28/24 by phone and verified that I am speaking with the correct person using two identifiers. She prefers a phone visit.   Location patient:  home Location provider:work or home office Persons participating in the virtual visit: patient,  provider  Concerns and/or follow up today: doing ok, but has been dealing with some abdominal issues after taking an abx for a kidney infection. Only having a little BM, with straining every few days.   See HM section in Epic for other details of completed HM.    ROS: negative for report of fevers, unintentional weight loss, vision changes, vision loss, hearing loss or change, chest pain, sob, hemoptysis, melena, hematochezia, hematuria, NVD, falls, bleeding or bruising, thoughts of suicide or self harm, memory loss  Patient-completed extensive health risk assessment - reviewed and discussed with the patient: See Health Risk Assessment completed with patient prior to the visit either above or in recent phone note. This was reviewed in detailed with the patient today and appropriate recommendations, orders and referrals were placed as needed per Summary below and patient instructions.   Review of Medical History: -PMH, PSH, Family History and current specialty and care providers reviewed and updated and listed below   Patient Care Team: Marquetta Sit, MD as PCP - General (Family Medicine)   Past Medical History:  Diagnosis Date   Anxiety    Anxiety and depression    Chronic constipation    Depression    GERD (gastroesophageal reflux disease)    History of hiatal hernia    History of migraine headaches    Seasonal allergic rhinitis    SUI (stress urinary incontinence, female)    Ureteral obstruction, right    Wears contact lenses     Past Surgical History:  Procedure Laterality Date   ANTERIOR CERVICAL DECOMP/DISCECTOMY FUSION  03-10-2004   C4 -- C6   BREAST BIOPSY Right 1980   BREAST EXCISIONAL BIOPSY Right 1980   COLONOSCOPY WITH PROPOFOL   03-08-2009   CYSTOSCOPY W/ URETERAL STENT PLACEMENT Right 10/02/2014   Procedure: CYSTOSCOPY WITH RIGHT RETROGRADE PYELOGRAM , URETERAL STENT PLACEMENT;  Surgeon: Mark C Ottelin, MD;  Location: [facility];  Service:  Urology;  Laterality: Right;   ESOPHAGOGASTRODUODENOSCOPY  04-02-2002   TOTAL HIP ARTHROPLASTY  03/03/2012   Procedure: TOTAL HIP ARTHROPLASTY;  Surgeon: Bevin Bucks, MD;  Location: [facility];  Service: Orthopedics;  Laterality: Left;   TUBAL LIGATION  1971   VAGINAL HYSTERECTOMY  1976    Social History   Socioeconomic History   Marital status: Married    Spouse name: Not on file   Number of children: 2   Years of education: Not on file   Highest education level: Not on file  Occupational History   Occupation: human resources   Occupation: retired  Tobacco Use   Smoking status: Former    Current packs/day: 0.00    Average packs/day: 0.5 packs/day for 20.0 years (10.0 ttl pk-yrs)    Types: Cigarettes    Start date: 10/02/1979    Quit date: 10/02/1999    Years since quitting: 24.4   Smokeless tobacco: Never  Vaping Use   Vaping status: Never Used  Substance and Sexual Activity   Alcohol use: Yes    Alcohol/week: 1.0 standard drink of alcohol    Types: 1 Glasses of wine per week    Comment: occaional   Drug use: No   Sexual activity: Not on file  Other Topics Concern   Not on file  Social History Narrative   Not on file   Social Drivers of Health   Financial Resource Strain: Low Risk  (02/28/2024)  Overall Financial Resource Strain (CARDIA)    Difficulty of Paying Living Expenses: Not hard at all  Food Insecurity: No Food Insecurity (02/28/2024)   Hunger Vital Sign    Worried About Running Out of Food in the Last Year: Never true    Ran Out of Food in the Last Year: Never true  Transportation Needs: No Transportation Needs (02/28/2024)   PRAPARE - Administrator, Civil Service (Medical): No    Lack of Transportation (Non-Medical): No  Physical Activity: Inactive (02/28/2024)   Exercise Vital Sign    Days of Exercise per Week: 0 days    Minutes of Exercise per Session: 0 min  Stress: No Stress Concern Present (02/28/2024)   [employer] of Occupational  Health - Occupational Stress Questionnaire    Feeling of Stress : Not at all  Social Connections: Moderately Integrated (02/28/2024)   Social Connection and Isolation Panel [NHANES]    Frequency of Communication with Friends and Family: Three times a week    Frequency of Social Gatherings with Friends and Family: Never    Attends Religious Services: More than 4 times per year    Active Member of [employer] or Organizations: No    Attends Banker Meetings: Never    Marital Status: Married  Catering manager Violence: Not At Risk (02/28/2024)   Humiliation, Afraid, Rape, and Kick questionnaire    Fear of Current or Ex-Partner: No    Emotionally Abused: No    Physically Abused: No    Sexually Abused: No    Family History  Problem Relation Age of Onset   Cirrhosis Mother        non alcohol cirrhosis   Cancer Father 81       lung cancer   Breast cancer Maternal Uncle    Colon cancer Neg Hx    Stomach cancer Neg Hx    Esophageal cancer Neg Hx     Current Outpatient Medications on File Prior to Visit  Medication Sig Dispense Refill   acetaminophen  (TYLENOL ) 325 MG tablet Take 2 tablets (650 mg total) by mouth every 6 (six) hours as needed for mild pain (or Fever >/= 101).     Calcium Carbonate-Vitamin D  (CALTRATE 600+D PO) Take 1 tablet by mouth 3 (three) times a week.     ciprofloxacin  (CIPRO ) 250 MG tablet Take 250 mg by mouth 2 (two) times daily.     citalopram  (CELEXA ) 40 MG tablet TAKE ONE TABLET BY MOUTH ONCE DAILY. PATIENT NEEDS AN APPOINTMENT FOR MORE REFILLS. 90 tablet 0   clonazePAM  (KLONOPIN ) 0.5 MG tablet Take 0.5 mg by mouth at bedtime.     diclofenac  (VOLTAREN ) 75 MG EC tablet Take 75 mg by mouth 2 (two) times daily.     fluticasone  (FLONASE ) 50 MCG/ACT nasal spray PLACE 2 SPRAYS INTO BOTH NOSTRILS DAILY 48 g 3   hydrocortisone  (ANUSOL -HC) 2.5 % rectal cream Place 1 Application rectally 2 (two) times daily. For 10 days 30 g 1   linaclotide  (LINZESS ) 290 MCG CAPS  capsule Take 1 capsule (290 mcg total) by mouth daily before breakfast. 30 capsule 5   SUMAtriptan  (IMITREX ) 100 MG tablet TAKE 1 TABLET BY MOUTH AT ONSET OF MIGRAINE. MAY REPEAT DOSE IN 1 HOUR IF HEADACHE NOT RESOLVED 9 tablet 0   triamcinolone  (KENALOG ) 0.1 % paste Use as directed 1 Application in the mouth or throat 2 (two) times daily. 5 g 5   zolpidem  (AMBIEN ) 5 MG tablet Take 1 tablet (5  mg total) by mouth at bedtime as needed. for sleep 30 tablet 5   No current facility-administered medications on file prior to visit.    Allergies  Allergen Reactions   Codeine Nausea Only   Hydrocodone  Nausea And Vomiting and Other (See Comments)    "get dizzy and spaced out" Other reaction(s): Other (See Comments) "get dizzy and spaced out"   Hydrocodone -Acetaminophen      Other Reaction(s): GI Intolerance       Physical Exam Vitals requested from patient and listed below if patient had equipment and was able to obtain at home for this virtual visit: There were no vitals filed for this visit. Estimated body mass index is 21.74 kg/m as calculated from the following:   Height as of 01/15/24: 5\' 3"  (1.6 m).   Weight as of 02/25/24: 122 lb 11.2 oz (55.7 kg).  EKG (optional): deferred due to virtual visit  GENERAL: alert, oriented, no acute distress detected, full vision exam deferred due to pandemic and/or virtual encounter  PSYCH/NEURO: pleasant and cooperative, no obvious depression or anxiety, speech and thought processing grossly intact, Cognitive function grossly intact  Flowsheet Row Clinical Support from 02/28/2024 in [facility] [place]  PHQ-9 Total Score 0           02/28/2024    2:06 PM 01/15/2024    3:25 PM 11/07/2023    1:07 PM 04/18/2023    2:12 PM 09/05/2022    1:59 PM  Depression screen PHQ 2/9  Decreased Interest 0 1 0 2 3  Down, Depressed, Hopeless 0 0 0 2 2  PHQ - 2 Score 0 1 0 4 5  Altered sleeping 0 0 0 0 2  Tired, decreased energy 0 1 0 2 3   Change in appetite 0 1 0 2 3  Feeling bad or failure about yourself  0 0 0 0 2  Trouble concentrating 0 1 0 0 3  Moving slowly or fidgety/restless 0 0 0 0 2  Suicidal thoughts 0 0 0 0 2  PHQ-9 Score 0 4 0 8 22  Difficult doing work/chores Not difficult at all Somewhat difficult Not difficult at all Somewhat difficult Very difficult       06/23/2022   11:38 AM 09/05/2022    1:52 PM 11/07/2023    1:08 PM 01/15/2024    3:25 PM 02/28/2024    2:07 PM  Fall Risk  Falls in the past year? 0 0 0 0 0  Was there an injury with Fall? 0 0 0 0 0  Fall Risk Category Calculator 0 0 0 0 0  Fall Risk Category (Retired) Low Low     (RETIRED) Patient Fall Risk Level Low fall risk Low fall risk     Patient at Risk for Falls Due to No Fall Risks No Fall Risks No Fall Risks No Fall Risks No Fall Risks  Fall risk Follow up Falls evaluation completed Falls evaluation completed Falls evaluation completed Falls evaluation completed Falls evaluation completed     SUMMARY AND PLAN:  Encounter for Medicare annual wellness exam   Discussed applicable health maintenance/preventive health measures and advised and referred or ordered per patient preferences: -discussed vaccines due, benefits, risks and options and recs; she plans to get at the pharmacy, she agrees to bring report once done -does her bone density tests with Dr. Cloretta Danes and will having in June 2025 -she saw PCP for constipation, but isn't sure how to address, has refill on the linzess . Discussed  various otc options/doses/risks for constipation treatment. Advised if not feeling better soon to follow back up with Dr. Geraldene Kleine.  Health Maintenance  Topic Date Due   Zoster Vaccines- Shingrix (1 of 2) 09/21/1965   DTaP/Tdap/Td (2 - Tdap) 01/20/2019   INFLUENZA VACCINE  05/23/2024   Medicare Annual Wellness (AWV)  02/27/2025   Pneumonia Vaccine 34+ Years old  Completed   DEXA SCAN  Completed   Hepatitis C Screening  Completed   HPV VACCINES  Aged Out    Meningococcal B Vaccine  Aged Out   Colonoscopy  Discontinued   COVID-19 Vaccine  Discontinued      Education and counseling on the following was provided based on the above review of health and a plan/checklist for the patient, along with additional information discussed, was provided for the patient in the patient instructions :   -Advised and counseled on a healthy lifestyle - including the importance of a healthy diet, regular physical activity, social connections  -Reviewed patient's current diet. Advised and counseled on a whole foods based healthy diet. A summary of a healthy diet was provided in the Patient Instructions.  -reviewed patient's current physical activity level and discussed exercise guidelines for adults. Discussed community resources and ideas for safe exercise at home to assist in meeting exercise guideline recommendations in a safe and healthy way.  -Advise yearly dental visits at minimum and regular eye exams   Follow up: see patient instructions     Patient Instructions  I really enjoyed getting to talk with you today! I am available on Tuesdays and Thursdays for virtual visits if you have any questions or concerns, or if I can be of any further assistance.   CHECKLIST FROM ANNUAL WELLNESS VISIT:  -Follow up (please call to schedule if not scheduled after visit):   -yearly for annual wellness visit with primary care office  Here is a list of your preventive care/health maintenance measures and the plan for each if any are due:  PLAN For any measures below that may be due:   Health Maintenance  Topic Date Due   Zoster Vaccines- Shingrix (1 of 2) 09/21/1965   DTaP/Tdap/Td (2 - Tdap) 01/20/2019   INFLUENZA VACCINE  05/23/2024   Medicare Annual Wellness (AWV)  02/27/2025   Pneumonia Vaccine 22+ Years old  Completed   DEXA SCAN  Completed   Hepatitis C Screening  Completed   HPV VACCINES  Aged Out   Meningococcal B Vaccine  Aged Out   Colonoscopy   Discontinued   COVID-19 Vaccine  Discontinued    -See a dentist at least yearly  -Get your eyes checked and then per your eye specialist's recommendations  -Other issues addressed today:   -I have included below further information regarding a healthy whole foods based diet, physical activity guidelines for adults, stress management and opportunities for social connections. I hope you find this information useful.   -----------------------------------------------------------------------------------------------------------------------------------------------------------------------------------------------------------------------------------------------------------    NUTRITION: -eat real food: lots of colorful vegetables (half the plate) and fruits -5-7 servings of vegetables and fruits per day (fresh or steamed is best), exp. 2 servings of vegetables with lunch and dinner and 2 servings of fruit per day. Berries and greens such as kale and collards are great choices.  -consume on a regular basis:  fresh fruits, fresh veggies, fish, nuts, seeds, healthy oils (such as olive oil, avocado oil), whole grains (make sure for bread/pasta/crackers/etc., that the first ingredient on label contains the word "whole"), legumes. -can eat small amounts  of dairy and lean meat (no larger than the palm of your hand), but avoid processed meats such as ham, bacon, lunch meat, etc. -drink water -try to avoid fast food and pre-packaged foods, processed meat, ultra processed foods/beverages (donuts, candy, etc.) -most experts advise limiting sodium to < 2300mg  per day, should limit further is any chronic conditions such as high blood pressure, heart disease, diabetes, etc. The American Heart Association advised that < 1500mg  is is ideal -try to avoid foods/beverages that contain any ingredients with names you do not recognize  -try to avoid foods/beverages  with added sugar or sweeteners/sweets  -try to avoid  sweet drinks (including diet drinks): soda, juice, Gatorade, sweet tea, power drinks, diet drinks -try to avoid white rice, white bread, pasta (unless whole grain)  EXERCISE GUIDELINES FOR ADULTS: -if you wish to increase your physical activity, do so gradually and with the approval of your doctor -STOP and seek medical care immediately if you have any chest pain, chest discomfort or trouble breathing when starting or increasing exercise  -move and stretch your body, legs, feet and arms when sitting for long periods -Physical activity guidelines for optimal health in adults: -get at least 150 minutes per week of moderate exercise (can talk, but not sing); this is about 20-30 minutes of sustained activity 5-7 days per week or two 10-15 minute episodes of sustained activity 5-7 days per week -do some muscle building/resistance training/strength training at least 2 days per week  -balance exercises 3+ days per week:   Stand somewhere where you have something sturdy to hold onto if you lose balance    1) lift up on toes, then back down, start with 5x per day and work up to 20x   2) stand and lift one leg straight out to the side so that foot is a few inches of the floor, start with 5x each side and work up to 20x each side   3) stand on one foot, start with 5 seconds each side and work up to 20 seconds on each side  If you need ideas or help with getting more active:  -Silver sneakers https://tools.silversneakers.com  -Walk with a Doc: http://www.duncan-williams.com/  -try to include resistance (weight lifting/strength building) and balance exercises twice per week: or the following link for ideas: http://castillo-powell.com/  BuyDucts.dk  STRESS MANAGEMENT: -can try meditating, or just sitting quietly with deep breathing while intentionally relaxing all parts of your body for 5 minutes daily -if you need  further help with stress, anxiety or depression please follow up with your primary doctor or contact the wonderful folks at [facility]: [phone]  SOCIAL CONNECTIONS: -options in [place] if you wish to engage in more social and exercise related activities:  -Silver sneakers https://tools.silversneakers.com  -Walk with a Doc: http://www.duncan-williams.com/  -Check out the [facility] Active Adults 50+ section on the [place] of [employer] (hiking clubs, book clubs, cards and games, chess, exercise classes, aquatic classes and much more) - see the website for details: https://www.[place].gov/departments/parks-recreation/active-adults50  -YouTube has lots of exercise videos for different ages and abilities as well  -[facility] (a variety of indoor and outdoor inperson activities for adults). 832-223-1618. [address].  -Virtual Online Classes (a variety of topics): see seniorplanet.org or call [phone])[phone]  -consider volunteering at a school, hospice center, church, senior center or elsewhere            Maurie Southern, DO

## 2024-02-28 NOTE — Progress Notes (Signed)
 Patient unable to obtain vital signs due to telehealth visit

## 2024-02-28 NOTE — Patient Instructions (Signed)
 I really enjoyed getting to talk with you today! I am available on Tuesdays and Thursdays for virtual visits if you have any questions or concerns, or if I can be of any further assistance.   CHECKLIST FROM ANNUAL WELLNESS VISIT:  -Follow up (please call to schedule if not scheduled after visit):   -yearly for annual wellness visit with primary care office  Here is a list of your preventive care/health maintenance measures and the plan for each if any are due:  PLAN For any measures below that may be due:   Health Maintenance  Topic Date Due   Zoster Vaccines- Shingrix (1 of 2) 09/21/1965   DTaP/Tdap/Td (2 - Tdap) 01/20/2019   INFLUENZA VACCINE  05/23/2024   Medicare Annual Wellness (AWV)  02/27/2025   Pneumonia Vaccine 94+ Years old  Completed   DEXA SCAN  Completed   Hepatitis C Screening  Completed   HPV VACCINES  Aged Out   Meningococcal B Vaccine  Aged Out   Colonoscopy  Discontinued   COVID-19 Vaccine  Discontinued    -See a dentist at least yearly  -Get your eyes checked and then per your eye specialist's recommendations  -Other issues addressed today:   -I have included below further information regarding a healthy whole foods based diet, physical activity guidelines for adults, stress management and opportunities for social connections. I hope you find this information useful.   -----------------------------------------------------------------------------------------------------------------------------------------------------------------------------------------------------------------------------------------------------------    NUTRITION: -eat real food: lots of colorful vegetables (half the plate) and fruits -5-7 servings of vegetables and fruits per day (fresh or steamed is best), exp. 2 servings of vegetables with lunch and dinner and 2 servings of fruit per day. Berries and greens such as kale and collards are great choices.  -consume on a regular basis:   fresh fruits, fresh veggies, fish, nuts, seeds, healthy oils (such as olive oil, avocado oil), whole grains (make sure for bread/pasta/crackers/etc., that the first ingredient on label contains the word "whole"), legumes. -can eat small amounts of dairy and lean meat (no larger than the palm of your hand), but avoid processed meats such as ham, bacon, lunch meat, etc. -drink water -try to avoid fast food and pre-packaged foods, processed meat, ultra processed foods/beverages (donuts, candy, etc.) -most experts advise limiting sodium to < 2300mg  per day, should limit further is any chronic conditions such as high blood pressure, heart disease, diabetes, etc. The American Heart Association advised that < 1500mg  is is ideal -try to avoid foods/beverages that contain any ingredients with names you do not recognize  -try to avoid foods/beverages  with added sugar or sweeteners/sweets  -try to avoid sweet drinks (including diet drinks): soda, juice, Gatorade, sweet tea, power drinks, diet drinks -try to avoid white rice, white bread, pasta (unless whole grain)  EXERCISE GUIDELINES FOR ADULTS: -if you wish to increase your physical activity, do so gradually and with the approval of your doctor -STOP and seek medical care immediately if you have any chest pain, chest discomfort or trouble breathing when starting or increasing exercise  -move and stretch your body, legs, feet and arms when sitting for long periods -Physical activity guidelines for optimal health in adults: -get at least 150 minutes per week of moderate exercise (can talk, but not sing); this is about 20-30 minutes of sustained activity 5-7 days per week or two 10-15 minute episodes of sustained activity 5-7 days per week -do some muscle building/resistance training/strength training at least 2 days per week  -balance exercises 3+  days per week:   Stand somewhere where you have something sturdy to hold onto if you lose balance    1) lift up  on toes, then back down, start with 5x per day and work up to 20x   2) stand and lift one leg straight out to the side so that foot is a few inches of the floor, start with 5x each side and work up to 20x each side   3) stand on one foot, start with 5 seconds each side and work up to 20 seconds on each side  If you need ideas or help with getting more active:  -Silver sneakers https://tools.silversneakers.com  -Walk with a Doc: http://www.duncan-williams.com/  -try to include resistance (weight lifting/strength building) and balance exercises twice per week: or the following link for ideas: http://castillo-powell.com/  BuyDucts.dk  STRESS MANAGEMENT: -can try meditating, or just sitting quietly with deep breathing while intentionally relaxing all parts of your body for 5 minutes daily -if you need further help with stress, anxiety or depression please follow up with your primary doctor or contact the wonderful folks at [facility]: [phone]  SOCIAL CONNECTIONS: -options in [place] if you wish to engage in more social and exercise related activities:  -Silver sneakers https://tools.silversneakers.com  -Walk with a Doc: http://www.duncan-williams.com/  -Check out the [facility] Active Adults 50+ section on the [place] [employer] (hiking clubs, book clubs, cards and games, chess, exercise classes, aquatic classes and much more) - see the website for details: https://www.[place].gov/departments/parks-recreation/active-adults50  -YouTube has lots of exercise videos for different ages and abilities as well  -[facility] (a variety of indoor and outdoor inperson activities for adults). [phone]. [address]  -Virtual Online Classes (a variety of topics): see seniorplanet.org or call 754-586-8855  -consider volunteering at a school, hospice center,  church, senior center or elsewhere

## 2024-02-29 NOTE — Telephone Encounter (Signed)
 Patient informed of the message below and voiced understanding

## 2024-03-06 DIAGNOSIS — N1330 Unspecified hydronephrosis: Secondary | ICD-10-CM | POA: Diagnosis not present

## 2024-03-06 DIAGNOSIS — N3000 Acute cystitis without hematuria: Secondary | ICD-10-CM | POA: Diagnosis not present

## 2024-03-06 DIAGNOSIS — M545 Low back pain, unspecified: Secondary | ICD-10-CM | POA: Diagnosis not present

## 2024-03-07 ENCOUNTER — Other Ambulatory Visit (HOSPITAL_COMMUNITY): Payer: Self-pay | Admitting: Urology

## 2024-03-07 DIAGNOSIS — N1330 Unspecified hydronephrosis: Secondary | ICD-10-CM

## 2024-03-08 ENCOUNTER — Emergency Department (HOSPITAL_COMMUNITY)
Admission: EM | Admit: 2024-03-08 | Discharge: 2024-03-08 | Disposition: A | Discharge status: Home / Self Care | Attending: Emergency Medicine | Admitting: Emergency Medicine

## 2024-03-08 ENCOUNTER — Other Ambulatory Visit: Payer: Self-pay

## 2024-03-08 ENCOUNTER — Emergency Department (HOSPITAL_COMMUNITY)

## 2024-03-08 DIAGNOSIS — X58XXXA Exposure to other specified factors, initial encounter: Secondary | ICD-10-CM | POA: Diagnosis not present

## 2024-03-08 DIAGNOSIS — R160 Hepatomegaly, not elsewhere classified: Secondary | ICD-10-CM | POA: Diagnosis not present

## 2024-03-08 DIAGNOSIS — N2889 Other specified disorders of kidney and ureter: Secondary | ICD-10-CM | POA: Diagnosis not present

## 2024-03-08 DIAGNOSIS — S32030A Wedge compression fracture of third lumbar vertebra, initial encounter for closed fracture: Secondary | ICD-10-CM | POA: Diagnosis not present

## 2024-03-08 DIAGNOSIS — R109 Unspecified abdominal pain: Secondary | ICD-10-CM | POA: Insufficient documentation

## 2024-03-08 DIAGNOSIS — K838 Other specified diseases of biliary tract: Secondary | ICD-10-CM | POA: Diagnosis not present

## 2024-03-08 DIAGNOSIS — S3992XA Unspecified injury of lower back, initial encounter: Secondary | ICD-10-CM | POA: Diagnosis present

## 2024-03-08 LAB — URINALYSIS, ROUTINE W REFLEX MICROSCOPIC
Bilirubin Urine: NEGATIVE
Glucose, UA: NEGATIVE mg/dL
Hgb urine dipstick: NEGATIVE
Ketones, ur: NEGATIVE mg/dL
Leukocytes,Ua: NEGATIVE
Nitrite: NEGATIVE
Protein, ur: NEGATIVE mg/dL
Specific Gravity, Urine: 1.006 (ref 1.005–1.030)
pH: 5 (ref 5.0–8.0)

## 2024-03-08 LAB — CBC
HCT: 39.6 % (ref 36.0–46.0)
Hemoglobin: 13.1 g/dL (ref 12.0–15.0)
MCH: 30.2 pg (ref 26.0–34.0)
MCHC: 33.1 g/dL (ref 30.0–36.0)
MCV: 91.2 fL (ref 80.0–100.0)
Platelets: 219 10*3/uL (ref 150–400)
RBC: 4.34 MIL/uL (ref 3.87–5.11)
RDW: 13.2 % (ref 11.5–15.5)
WBC: 7.2 10*3/uL (ref 4.0–10.5)
nRBC: 0 % (ref 0.0–0.2)

## 2024-03-08 LAB — BASIC METABOLIC PANEL WITH GFR
Anion gap: 9 (ref 5–15)
BUN: 13 mg/dL (ref 8–23)
CO2: 25 mmol/L (ref 22–32)
Calcium: 9.3 mg/dL (ref 8.9–10.3)
Chloride: 101 mmol/L (ref 98–111)
Creatinine, Ser: 0.85 mg/dL (ref 0.44–1.00)
GFR, Estimated: >60 mL/min (ref >60)
Glucose, Bld: 100 mg/dL — ABNORMAL HIGH (ref 70–99)
Potassium: 3.5 mmol/L (ref 3.5–5.1)
Sodium: 135 mmol/L (ref 135–145)

## 2024-03-08 MED ORDER — OXYCODONE-ACETAMINOPHEN 5-325 MG PO TABS: 1.0000 | ORAL_TABLET | Freq: Four times a day (QID) | ORAL | 0 refills | Status: DC | PRN

## 2024-03-08 MED ORDER — DICLOFENAC EPOLAMINE 1.3 % EX PTCH: 1.0000 | MEDICATED_PATCH | Freq: Two times a day (BID) | CUTANEOUS | 1 refills | Status: DC

## 2024-03-08 NOTE — ED Triage Notes (Addendum)
 Pt c/o bilat flank pain for 4x weeks. Pt also reports urinary incontinence that began today.  Started cipro , flomax, and meloxicam 2x days ago

## 2024-03-08 NOTE — Discharge Instructions (Signed)
 Today's evaluation has been generally reassuring.  Your labs and urinalysis did not demonstrate evidence for a new infection, nor systemic consequences.  However, your CT imaging suggested a new lumbar compression fracture that requires follow-up with primary care and orthopedics.  Return here for concerning changes in your condition.

## 2024-03-08 NOTE — ED Provider Notes (Signed)
 [place] EMERGENCY DEPARTMENT AT [facility] Provider Note   CSN: [id number] Arrival date & time: 03/08/24  1727     History  Chief Complaint  Patient presents with   Flank Pain    Kim Daniel is a 78 y.o. female.  HPI Suprapubic flank pain.  Patient is here with family who assists with the history.  She has a history of kidney stones, cystitis, has been seen and evaluated by neurology this episode began more than a week ago, and she initially saw urology, started ciprofloxacin .  2 days ago she went back to urology, had prolongation of her antibiotic course as well as new medications.  Today, patient presents for evaluation.  Is able to urinate, she has suprapubic pain.  She has no chest pain, vomiting, fever.    Home Medications Prior to Admission medications   Medication Sig Start Date End Date Taking? Authorizing Provider  diclofenac  (FLECTOR) 1.3 % PTCH Place 1 patch onto the skin 2 (two) times daily. 03/08/24  Yes Dorenda Gandy, MD  oxyCODONE -acetaminophen  (PERCOCET/ROXICET) 5-325 MG tablet Take 1 tablet by mouth every 6 (six) hours as needed for severe pain (pain score 7-10). 03/08/24  Yes Dorenda Gandy, MD  acetaminophen  (TYLENOL ) 325 MG tablet Take 2 tablets (650 mg total) by mouth every 6 (six) hours as needed for mild pain (or Fever >/= 101). 12/12/22   Armenta Landau, MD  Calcium Carbonate-Vitamin D  (CALTRATE 600+D PO) Take 1 tablet by mouth 3 (three) times a week.    [provider]  ciprofloxacin  (CIPRO ) 250 MG tablet Take 250 mg by mouth 2 (two) times daily. 02/25/24   [provider]  citalopram  (CELEXA ) 40 MG tablet TAKE ONE TABLET BY MOUTH ONCE DAILY. PATIENT NEEDS AN APPOINTMENT FOR MORE REFILLS. 01/07/24   Burchette, Marijean Shouts, MD  clonazePAM  (KLONOPIN ) 0.5 MG tablet Take 0.5 mg by mouth at bedtime. 03/15/22   [provider]  diclofenac  (VOLTAREN ) 75 MG EC tablet Take 75 mg by mouth 2 (two) times daily.    [provider]  fluticasone  (FLONASE ) 50 MCG/ACT nasal spray PLACE 2 SPRAYS INTO BOTH NOSTRILS DAILY 06/17/19   Burchette, Marijean Shouts, MD  hydrocortisone  (ANUSOL -HC) 2.5 % rectal cream Place 1 Application rectally 2 (two) times daily. For 10 days 10/30/23   Arlee Bellows, NP  linaclotide  (LINZESS ) 290 MCG CAPS capsule Take 1 capsule (290 mcg total) by mouth daily before breakfast. 02/25/24   Burchette, Marijean Shouts, MD  SUMAtriptan  (IMITREX ) 100 MG tablet TAKE 1 TABLET BY MOUTH AT ONSET OF MIGRAINE. MAY REPEAT DOSE IN 1 HOUR IF HEADACHE NOT RESOLVED 10/13/19   Burchette, Marijean Shouts, MD  triamcinolone  (KENALOG ) 0.1 % paste Use as directed 1 Application in the mouth or throat 2 (two) times daily. 04/18/23   Burchette, Marijean Shouts, MD  zolpidem  (AMBIEN ) 5 MG tablet Take 1 tablet (5 mg total) by mouth at bedtime as needed. for sleep 02/11/24   Marquetta Sit, MD      Allergies    Codeine, Hydrocodone , and Hydrocodone -acetaminophen     Review of Systems   Review of Systems  Physical Exam Updated Vital Signs BP (!) 142/78 (BP Location: Right Arm)   Pulse 66   Temp 98.2 F (36.8 C) (Oral)   Resp 18   Ht 5\' 4"  (1.626 m)   Wt 55.3 kg   SpO2 100%   BMI 20.94 kg/m  Physical Exam Vitals and nursing note reviewed.  Constitutional:  General: She is not in acute distress.    Appearance: She is well-developed.  HENT:     Head: Normocephalic and atraumatic.  Eyes:     Conjunctiva/sclera: Conjunctivae normal.  Pulmonary:     Effort: Pulmonary effort is normal. No respiratory distress.     Breath sounds: No stridor.  Abdominal:     General: There is no distension.     Tenderness: There is abdominal tenderness.  Skin:    General: Skin is warm and dry.  Neurological:     Mental Status: She is alert and oriented to person, place, and time.     Cranial Nerves: No cranial nerve deficit.  Psychiatric:        Mood and Affect: Mood normal.     ED Results / Procedures / Treatments   Labs (all labs  ordered are listed, but only abnormal results are displayed) Labs Reviewed  URINALYSIS, ROUTINE W REFLEX MICROSCOPIC - Abnormal; Notable for the following components:      Result Value   Color, Urine STRAW (*)    All other components within normal limits  BASIC METABOLIC PANEL WITH GFR - Abnormal; Notable for the following components:   Glucose, Bld 100 (*)    All other components within normal limits  CBC    EKG None  Radiology CT Renal Stone Study Result Date: 03/08/2024 CLINICAL DATA:  Abdominal/flank pain, stone suspected EXAM: CT ABDOMEN AND PELVIS WITHOUT CONTRAST TECHNIQUE: Multidetector CT imaging of the abdomen and pelvis was performed following the standard protocol without IV contrast. RADIATION DOSE REDUCTION: This exam was performed according to the departmental dose-optimization program which includes automated exposure control, adjustment of the mA and/or kV according to patient size and/or use of iterative reconstruction technique. COMPARISON:  December 09, 2022 FINDINGS: Evaluation is limited by lack of IV contrast. Lower chest: No acute abnormality. Similar endobronchial debris within the RIGHT middle lobe. Hepatobiliary: Query lobular contours of the liver. Similar subcentimeter hypodense masses of the RIGHT liver compared to prior, incompletely assessed. Gallbladder is unremarkable. Common bile duct is dilated at 11 mm, similar in comparison to prior. Pancreas: No peripancreatic fat stranding. Spleen: Unremarkable. Adrenals/Urinary Tract: Adrenal glands are unremarkable. Revisualization a moderately dilated RIGHT renal pelvis with a likely ureteropelvic junction obstruction of uncertain etiology. Extent of dilation is similar in comparison to prior. No obstructing nephrolithiasis. No LEFT-sided hydronephrosis. Bladder is unremarkable thin the limitations of the exam. Stomach/Bowel: No evidence of bowel obstruction. Stomach is unremarkable for degree of distension.  Vascular/Lymphatic: Atherosclerotic calcifications of the nonaneurysmal abdominal aorta. No new suspicious lymphadenopathy. Reproductive: Status post hysterectomy. No adnexal masses. Other: No free air or free fluid. Musculoskeletal: Status post LEFT hip arthroplasty. New inferior endplate irregularity of L3 and L2. IMPRESSION: 1. There is new inferior endplate irregularity of L3 and L2. Differential considerations include early degenerative changes or early changes of a compression fracture. Recommend correlation with point tenderness. If there is concern for acute fracture, recommend dedicated MRI. 2. Similar appearance of moderately dilated RIGHT renal pelvis with a likely ureteropelvic junction obstruction of uncertain etiology. 3. Similar dilation of the common bile duct. Recommend correlation with LFTs. Aortic Atherosclerosis (ICD10-I70.0). Electronically Signed   By: Clancy Crimes M.D.   On: 03/08/2024 19:01    Procedures Procedures    Medications Ordered in ED Medications - No data to display  ED Course/ Medical Decision Making/ A&P  Medical Decision Making Elderly female with prior cystitis, stones, presents with abdominal pain, flank pain.  Broad differential including recurrent cystitis, nephrolithiasis, pyelonephritis, bacteremia, sepsis, mass.  Initial vital signs reassuring.  Cardiac 65 sinus normal pulse ox 100% room air normal  Amount and/or Complexity of Data Reviewed Independent Historian: friend External Data Reviewed: notes. Labs: ordered. Decision-making details documented in ED Course. Radiology: ordered and independent interpretation performed. Decision-making details documented in ED Course.  Risk Decision regarding hospitalization. Diagnosis or treatment significantly limited by social determinants of health.   7:38 PM On repeat exam patient awake, alert, hemodynamically unremarkable.  Demonstrated the CT images to the patient  and her daughter at bedside.  Concern for lumbar compression fracture, given the description of pain across the lower abdomen, bilateral, some consideration of radicular distribution of pain secondary to this given the reassuring findings otherwise, no evidence for urinary retention, acute cystitis, bacteremia, sepsis.  We discussed pain medication regimen, Ortho follow-up and she will continue to follow-up with her urology team as well.        Final Clinical Impression(s) / ED Diagnoses Final diagnoses:  Compression fracture of L3 vertebra, initial encounter [facility])    Rx / DC Orders ED Discharge Orders          Ordered    diclofenac  (FLECTOR) 1.3 % PTCH  2 times daily        03/08/24 1938    oxyCODONE -acetaminophen  (PERCOCET/ROXICET) 5-325 MG tablet  Every 6 hours PRN        03/08/24 1938              Dorenda Gandy, MD 03/08/24 [phone]

## 2024-03-12 DIAGNOSIS — S32000A Wedge compression fracture of unspecified lumbar vertebra, initial encounter for closed fracture: Secondary | ICD-10-CM | POA: Diagnosis not present

## 2024-03-12 DIAGNOSIS — M545 Low back pain, unspecified: Secondary | ICD-10-CM | POA: Diagnosis not present

## 2024-03-13 ENCOUNTER — Ambulatory Visit (HOSPITAL_COMMUNITY)

## 2024-03-13 ENCOUNTER — Encounter (HOSPITAL_COMMUNITY): Payer: Self-pay

## 2024-03-19 ENCOUNTER — Telehealth: Payer: Self-pay

## 2024-03-19 MED ORDER — OXYCODONE-ACETAMINOPHEN 5-325 MG PO TABS: 1.0000 | ORAL_TABLET | Freq: Four times a day (QID) | ORAL | 0 refills | Status: DC | PRN

## 2024-03-19 NOTE — Telephone Encounter (Signed)
 Sent in one refill of Oxycodone .  Do recommend office follow up if pain persists.  Marquetta Sit MD [facility] at [facility]

## 2024-03-19 NOTE — Telephone Encounter (Signed)
 Patient informed of the message below and voiced understanding. Patient inquired if PCP can refill Oxycodone ?

## 2024-03-19 NOTE — Addendum Note (Signed)
 Addended by: Marquetta Sit on: 03/19/2024 05:26 PM   Modules accepted: Orders

## 2024-03-19 NOTE — Telephone Encounter (Signed)
 Copied from CRM [id number]. Topic: Clinical - Medication Question >> Mar 19, 2024 12:07 PM Kim Daniel wrote: Reason for CRM: pt called to speak with dr burchette regarding her recent visit in [place] emergency room, wanted dr burchette to review the ct scan that was taken of her back. Pt states she has a compression fracture of her vertebrate . Please give pt a call back at [phone]

## 2024-03-20 ENCOUNTER — Ambulatory Visit (INDEPENDENT_AMBULATORY_CARE_PROVIDER_SITE_OTHER): Admitting: Internal Medicine

## 2024-03-20 ENCOUNTER — Encounter: Payer: Self-pay | Admitting: Internal Medicine

## 2024-03-20 VITALS — BP 120/80 | HR 76 | Temp 98.5°F | Wt 121.2 lb

## 2024-03-20 DIAGNOSIS — M545 Low back pain, unspecified: Secondary | ICD-10-CM | POA: Diagnosis not present

## 2024-03-20 NOTE — Telephone Encounter (Signed)
 Patient informed.

## 2024-03-20 NOTE — Progress Notes (Signed)
 Established Patient Office Visit     CC/Reason for Visit: Back pain  HPI: Kim Daniel is a 78 y.o. female who is coming in today for the above mentioned reasons.  She went to the emergency department on May 17 and was found on CT scan done for presumed nephrolithiasis to have endplate irregularity of L2 and L3.  Differential considerations include early compression fractures or degenerative changes.  She has already secured follow-up with Dr. Vaughn Georges for an MRI and evaluation.  Her PCP prescribed oxycodone  yesterday which she will be picking up today.  She was unaware of this prior to scheduling this appointment.   Past Medical/Surgical History: Past Medical History:  Diagnosis Date   Anxiety    Anxiety and depression    Chronic constipation    Depression    GERD (gastroesophageal reflux disease)    History of hiatal hernia    History of migraine headaches    Seasonal allergic rhinitis    SUI (stress urinary incontinence, female)    Ureteral obstruction, right    Wears contact lenses     Past Surgical History:  Procedure Laterality Date   ANTERIOR CERVICAL DECOMP/DISCECTOMY FUSION  03-10-2004   C4 -- C6   BREAST BIOPSY Right 1980   BREAST EXCISIONAL BIOPSY Right 1980   COLONOSCOPY WITH PROPOFOL   03-08-2009   CYSTOSCOPY W/ URETERAL STENT PLACEMENT Right 10/02/2014   Procedure: CYSTOSCOPY WITH RIGHT RETROGRADE PYELOGRAM , URETERAL STENT PLACEMENT;  Surgeon: Mark C Ottelin, MD;  Location: [facility];  Service: Urology;  Laterality: Right;   ESOPHAGOGASTRODUODENOSCOPY  04-02-2002   TOTAL HIP ARTHROPLASTY  03/03/2012   Procedure: TOTAL HIP ARTHROPLASTY;  Surgeon: Bevin Bucks, MD;  Location: [facility];  Service: Orthopedics;  Laterality: Left;   TUBAL LIGATION  1971   VAGINAL HYSTERECTOMY  1976    Social History:  reports that she quit smoking about 24 years ago. Her smoking use included cigarettes. She started smoking about 44 years ago. She has a 10  pack-year smoking history. She has never used smokeless tobacco. She reports current alcohol use of about 1.0 standard drink of alcohol per week. She reports that she does not use drugs.  Allergies: Allergies  Allergen Reactions   Codeine Nausea Only   Hydrocodone  Nausea And Vomiting and Other (See Comments)    "get dizzy and spaced out" Other reaction(s): Other (See Comments) "get dizzy and spaced out"   Hydrocodone -Acetaminophen      Other Reaction(s): GI Intolerance    Family History:  Family History  Problem Relation Age of Onset   Cirrhosis Mother        non alcohol cirrhosis   Cancer Father 68       lung cancer   Breast cancer Maternal Uncle    Colon cancer Neg Hx    Stomach cancer Neg Hx    Esophageal cancer Neg Hx      Current Outpatient Medications:    acetaminophen  (TYLENOL ) 325 MG tablet, Take 2 tablets (650 mg total) by mouth every 6 (six) hours as needed for mild pain (or Fever >/= 101)., Disp: , Rfl:    Calcium Carbonate-Vitamin D  (CALTRATE 600+D PO), Take 1 tablet by mouth 3 (three) times a week., Disp: , Rfl:    ciprofloxacin  (CIPRO ) 250 MG tablet, Take 250 mg by mouth 2 (two) times daily., Disp: , Rfl:    citalopram  (CELEXA ) 40 MG tablet, TAKE ONE TABLET BY MOUTH ONCE DAILY. PATIENT NEEDS AN APPOINTMENT FOR  MORE REFILLS., Disp: 90 tablet, Rfl: 0   clonazePAM  (KLONOPIN ) 0.5 MG tablet, Take 0.5 mg by mouth at bedtime., Disp: , Rfl:    diclofenac  (FLECTOR ) 1.3 % PTCH, Place 1 patch onto the skin 2 (two) times daily., Disp: 5 patch, Rfl: 1   diclofenac  (VOLTAREN ) 75 MG EC tablet, Take 75 mg by mouth 2 (two) times daily., Disp: , Rfl:    fluticasone  (FLONASE ) 50 MCG/ACT nasal spray, PLACE 2 SPRAYS INTO BOTH NOSTRILS DAILY, Disp: 48 g, Rfl: 3   hydrocortisone  (ANUSOL -HC) 2.5 % rectal cream, Place 1 Application rectally 2 (two) times daily. For 10 days, Disp: 30 g, Rfl: 1   linaclotide  (LINZESS ) 290 MCG CAPS capsule, Take 1 capsule (290 mcg total) by mouth daily before  breakfast., Disp: 30 capsule, Rfl: 5   oxyCODONE -acetaminophen  (PERCOCET/ROXICET) 5-325 MG tablet, Take 1 tablet by mouth every 6 (six) hours as needed for severe pain (pain score 7-10)., Disp: 15 tablet, Rfl: 0   SUMAtriptan  (IMITREX ) 100 MG tablet, TAKE 1 TABLET BY MOUTH AT ONSET OF MIGRAINE. MAY REPEAT DOSE IN 1 HOUR IF HEADACHE NOT RESOLVED, Disp: 9 tablet, Rfl: 0   triamcinolone  (KENALOG ) 0.1 % paste, Use as directed 1 Application in the mouth or throat 2 (two) times daily., Disp: 5 g, Rfl: 5   zolpidem  (AMBIEN ) 5 MG tablet, Take 1 tablet (5 mg total) by mouth at bedtime as needed. for sleep, Disp: 30 tablet, Rfl: 5  Review of Systems:  Negative unless indicated in HPI.   Physical Exam: Vitals:   03/20/24 1039  BP: 120/80  Pulse: 76  Temp: 98.5 F (36.9 C)  TempSrc: Oral  SpO2: 99%  Weight: 121 lb 3.2 oz (55 kg)    Body mass index is 20.8 kg/m.   Impression and Plan:  Acute left-sided low back pain without sciatica  -She will continue oxycodone  prescribed by PCP until seen by orthopedic surgery in the coming weeks.   Time spent:21 minutes reviewing chart, interviewing and examining patient and formulating plan of care.     Marguerita Shih, MD [facility] at [facility]

## 2024-03-26 ENCOUNTER — Ambulatory Visit: Admitting: Internal Medicine

## 2024-03-26 DIAGNOSIS — M545 Low back pain, unspecified: Secondary | ICD-10-CM | POA: Diagnosis not present

## 2024-03-31 ENCOUNTER — Other Ambulatory Visit: Payer: Self-pay | Admitting: Family Medicine

## 2024-03-31 MED ORDER — OXYCODONE-ACETAMINOPHEN 5-325 MG PO TABS: 1.0000 | ORAL_TABLET | Freq: Four times a day (QID) | ORAL | 0 refills | Status: DC | PRN

## 2024-03-31 NOTE — Telephone Encounter (Signed)
 Refilled her oxycodone  once but would strongly advise avoidance of Ambien  or Klonopin  when taking the oxycodone .  Can have additive risk.  Marquetta Sit MD [facility] at [facility]

## 2024-03-31 NOTE — Telephone Encounter (Signed)
 Copied from CRM [id number]. Topic: Clinical - Medication Refill >> Mar 31, 2024  9:25 AM Zipporah Him wrote: Medication: oxyCODONE -acetaminophen  (PERCOCET/ROXICET) 5-325 MG tablet  Has the patient contacted their pharmacy? No Never had this prescription  there.  This is the patient's preferred pharmacy:  [id number] - [place], [place] - [address] [place] [address] Phone: [phone] Fax: [phone]  Is this the correct pharmacy for this prescription? Yes If no, delete pharmacy and type the correct one.   Has the prescription been filled recently? Yes  Is the patient out of the medication? Yes, took last one yesterday morning. Supposed to take 2 a day. Sees Dr for results on Wednesday the 11th.   Has the patient been seen for an appointment in the last year OR does the patient have an upcoming appointment? Yes  Can we respond through MyChart? Yes  Agent: Please be advised that Rx refills may take up to 3 business days. We ask that you follow-up with your pharmacy.

## 2024-04-01 NOTE — Telephone Encounter (Signed)
 Patient informed of the message below.

## 2024-04-02 ENCOUNTER — Telehealth: Payer: Self-pay | Admitting: Family Medicine

## 2024-04-02 DIAGNOSIS — M545 Low back pain, unspecified: Secondary | ICD-10-CM | POA: Diagnosis not present

## 2024-04-02 NOTE — Telephone Encounter (Signed)
 Patient dropped off document Surgical Clearance, to be filled out by provider. Patient requested to send it back via Fax within 5-days. Document is located in providers tray at front office.Please advise at Mobile [phone] (mobile)

## 2024-04-03 ENCOUNTER — Other Ambulatory Visit: Payer: Self-pay | Admitting: Family Medicine

## 2024-04-04 ENCOUNTER — Emergency Department (HOSPITAL_COMMUNITY)
Admission: EM | Admit: 2024-04-04 | Discharge: 2024-04-04 | Disposition: A | Discharge status: Home / Self Care | Attending: Emergency Medicine | Admitting: Emergency Medicine

## 2024-04-04 ENCOUNTER — Emergency Department (HOSPITAL_COMMUNITY)

## 2024-04-04 ENCOUNTER — Other Ambulatory Visit: Payer: Self-pay

## 2024-04-04 DIAGNOSIS — R0789 Other chest pain: Secondary | ICD-10-CM | POA: Insufficient documentation

## 2024-04-04 DIAGNOSIS — R079 Chest pain, unspecified: Secondary | ICD-10-CM | POA: Diagnosis not present

## 2024-04-04 DIAGNOSIS — Z87891 Personal history of nicotine dependence: Secondary | ICD-10-CM | POA: Diagnosis not present

## 2024-04-04 LAB — BASIC METABOLIC PANEL WITH GFR
Anion gap: 10 (ref 5–15)
BUN: 14 mg/dL (ref 8–23)
CO2: 25 mmol/L (ref 22–32)
Calcium: 9.1 mg/dL (ref 8.9–10.3)
Chloride: 99 mmol/L (ref 98–111)
Creatinine, Ser: 0.72 mg/dL (ref 0.44–1.00)
GFR, Estimated: >60 mL/min (ref >60)
Glucose, Bld: 101 mg/dL — ABNORMAL HIGH (ref 70–99)
Potassium: 3.3 mmol/L — ABNORMAL LOW (ref 3.5–5.1)
Sodium: 134 mmol/L — ABNORMAL LOW (ref 135–145)

## 2024-04-04 LAB — TROPONIN I (HIGH SENSITIVITY)
Troponin I (High Sensitivity): 3 ng/L (ref <18)
Troponin I (High Sensitivity): 2 ng/L (ref <18)

## 2024-04-04 LAB — CBC
HCT: 40.5 % (ref 36.0–46.0)
Hemoglobin: 13.4 g/dL (ref 12.0–15.0)
MCH: 29.8 pg (ref 26.0–34.0)
MCHC: 33.1 g/dL (ref 30.0–36.0)
MCV: 90.2 fL (ref 80.0–100.0)
Platelets: 259 10*3/uL (ref 150–400)
RBC: 4.49 MIL/uL (ref 3.87–5.11)
RDW: 13.1 % (ref 11.5–15.5)
WBC: 10.8 10*3/uL — ABNORMAL HIGH (ref 4.0–10.5)
nRBC: 0 % (ref 0.0–0.2)

## 2024-04-04 MED ORDER — ONDANSETRON HCL 4 MG/2ML IJ SOLN
4.0000 mg | Freq: Once | INTRAMUSCULAR | Status: AC
  Administered 2024-04-04: 4 mg via INTRAVENOUS

## 2024-04-04 MED ORDER — HYDROMORPHONE HCL 1 MG/ML IJ SOLN
0.5000 mg | Freq: Once | INTRAMUSCULAR | Status: AC
  Administered 2024-04-04: 0.5 mg via INTRAVENOUS
  Filled 2024-04-04: qty 1

## 2024-04-04 MED ORDER — ONDANSETRON HCL 4 MG/2ML IJ SOLN
INTRAMUSCULAR | Status: AC
  Filled 2024-04-04: qty 2

## 2024-04-04 NOTE — ED Provider Notes (Addendum)
 [employer] EMERGENCY DEPARTMENT AT [facility] Provider Note   CSN: [id number] Arrival date & time: 04/04/24  1659     Patient presents with: Chest Pain   Kim Daniel is a 78 y.o. female.   Patient with onset of mid upper sternal chest pain radiates into the neck and right arm.  Pain is worse with inspiration.  No injury.  Never had any chest pain before.  If patient lays perfectly still and does not read she does not get any discomfort.  No leg swelling.  Past medical history significant for gastroesophageal reflux disease and history of migraine headaches.  Patient is a former smoker quit in 2000.       Prior to Admission medications   Medication Sig Start Date End Date Taking? Authorizing Provider  acetaminophen  (TYLENOL ) 325 MG tablet Take 2 tablets (650 mg total) by mouth every 6 (six) hours as needed for mild pain (or Fever >/= 101). 12/12/22   Armenta Landau, MD  Calcium Carbonate-Vitamin D  (CALTRATE 600+D PO) Take 1 tablet by mouth 3 (three) times a week.    [provider]  ciprofloxacin  (CIPRO ) 250 MG tablet Take 250 mg by mouth 2 (two) times daily. 02/25/24   [provider]  citalopram  (CELEXA ) 40 MG tablet TAKE ONE TABLET BY MOUTH ONCE DAILY. PATIENT NEEDS AN APPOINTMENT FOR MORE REFILLS. 04/04/24   Burchette, Marijean Shouts, MD  clonazePAM  (KLONOPIN ) 0.5 MG tablet Take 0.5 mg by mouth at bedtime. 03/15/22   [provider]  diclofenac  (FLECTOR ) 1.3 % PTCH Place 1 patch onto the skin 2 (two) times daily. 03/08/24   Dorenda Gandy, MD  diclofenac  (VOLTAREN ) 75 MG EC tablet Take 75 mg by mouth 2 (two) times daily.    [provider]  fluticasone  (FLONASE ) 50 MCG/ACT nasal spray PLACE 2 SPRAYS INTO BOTH NOSTRILS DAILY 06/17/19   Burchette, Marijean Shouts, MD  hydrocortisone  (ANUSOL -HC) 2.5 % rectal cream Place 1 Application rectally 2 (two) times daily. For 10 days 10/30/23   Arlee Bellows, NP  linaclotide  (LINZESS ) 290 MCG CAPS capsule  Take 1 capsule (290 mcg total) by mouth daily before breakfast. 02/25/24   Burchette, Marijean Shouts, MD  oxyCODONE -acetaminophen  (PERCOCET/ROXICET) 5-325 MG tablet Take 1 tablet by mouth every 6 (six) hours as needed for severe pain (pain score 7-10). 03/31/24   Burchette, Marijean Shouts, MD  SUMAtriptan  (IMITREX ) 100 MG tablet TAKE 1 TABLET BY MOUTH AT ONSET OF MIGRAINE. MAY REPEAT DOSE IN 1 HOUR IF HEADACHE NOT RESOLVED 10/13/19   Burchette, Marijean Shouts, MD  triamcinolone  (KENALOG ) 0.1 % paste Use as directed 1 Application in the mouth or throat 2 (two) times daily. 04/18/23   Burchette, Marijean Shouts, MD  zolpidem  (AMBIEN ) 5 MG tablet Take 1 tablet (5 mg total) by mouth at bedtime as needed. for sleep 02/11/24   Marquetta Sit, MD    Allergies: Codeine, Hydrocodone , and Hydrocodone -acetaminophen     Review of Systems  Constitutional:  Negative for chills and fever.  HENT:  Negative for ear pain and sore throat.   Eyes:  Negative for pain and visual disturbance.  Respiratory:  Negative for cough and shortness of breath.   Cardiovascular:  Positive for chest pain. Negative for palpitations.  Gastrointestinal:  Negative for abdominal pain and vomiting.  Genitourinary:  Negative for dysuria and hematuria.  Musculoskeletal:  Negative for arthralgias and back pain.  Skin:  Negative for color change and rash.  Neurological:  Negative for seizures and syncope.  All other systems reviewed and are negative.   Updated Vital Signs BP 116/73   Pulse 80   Temp 98.2 F (36.8 C) (Oral)   Resp 18   Ht 1.626 m (5' 4)   Wt 55.3 kg   SpO2 99%   BMI 20.94 kg/m   Physical Exam Vitals and nursing note reviewed.  Constitutional:      General: She is not in acute distress.    Appearance: Normal appearance. She is well-developed.  HENT:     Head: Normocephalic and atraumatic.     Mouth/Throat:     Mouth: Mucous membranes are moist.   Eyes:     Extraocular Movements: Extraocular movements intact.      Conjunctiva/sclera: Conjunctivae normal.     Pupils: Pupils are equal, round, and reactive to light.    Cardiovascular:     Rate and Rhythm: Normal rate and regular rhythm.     Heart sounds: No murmur heard. Pulmonary:     Effort: Pulmonary effort is normal. No respiratory distress.     Breath sounds: Normal breath sounds.  Abdominal:     Palpations: Abdomen is soft.     Tenderness: There is no abdominal tenderness.   Musculoskeletal:        General: No swelling.     Cervical back: Normal range of motion and neck supple.     Right lower leg: No edema.     Left lower leg: No edema.   Skin:    General: Skin is warm and dry.     Capillary Refill: Capillary refill takes less than 2 seconds.   Neurological:     General: No focal deficit present.     Mental Status: She is alert and oriented to person, place, and time.   Psychiatric:        Mood and Affect: Mood normal.     (all labs ordered are listed, but only abnormal results are displayed) Labs Reviewed  BASIC METABOLIC PANEL WITH GFR  CBC  TROPONIN I (HIGH SENSITIVITY)  TROPONIN I (HIGH SENSITIVITY)    EKG: EKG Interpretation Date/Time:  Friday April 04 2024 17:12:42 EDT Ventricular Rate:  84 PR Interval:  186 QRS Duration:  95 QT Interval:  392 QTC Calculation: 464 R Axis:   -71  Text Interpretation: Sinus rhythm Left anterior fascicular block Low voltage, precordial leads Abnormal R-wave progression, early transition New since previous tracing Confirmed by Jahmeek Shirk [phone]) on 04/04/2024 6:45:15 PM  Radiology: Lenell Query Chest 2 View Result Date: 04/04/2024 CLINICAL DATA:  Chest pain. EXAM: CHEST - 2 VIEW COMPARISON:  03/02/2012. FINDINGS: Bilateral lung fields are clear. Bilateral costophrenic angles are clear. Stable cardio-mediastinal silhouette. No acute osseous abnormalities. Lower cervical spinal fixation hardware noted. The soft tissues are within normal limits. IMPRESSION: No active cardiopulmonary disease.  Electronically Signed   By: Beula Brunswick M.D.   On: 04/04/2024 17:53     Procedures   Medications Ordered in the ED - No data to display                                  Medical Decision Making Amount and/or Complexity of Data Reviewed Labs: ordered. Radiology: ordered.  Risk Prescription drug management.   Chest x-ray without any acute findings.  EKG with some subtle changes compared to EKG in the past but the last one was several years ago.  CBC basic metabolic panel and troponin are  pending.  Patient's oxygen saturation here is 98%.  Temp 98.2 pulse 86 respirations 18 blood pressure 137/78.  Patient says symptoms are increased when she raises her head making the sound like it is musculoskeletal.  Also hurts some with deep inspiration.  Clinically not concerned about pulmonary embolus.  This is really seems to be more musculoskeletal chest pain.  But this is a new pain for her she is 80 so we will follow delta troponins.  Patient's workup here initial troponin was 3.  Repeat was 2.  Basic metabolic panel sodium down a little bit at 134 potassium slightly down 3.3 blood sugar 101.  Renal function was normal white count 10.8 hemoglobin 13.4 platelets 259.  Chest x-ray 2 view no acute findings.  Was extra planning on discharging patient when she started to feel very nauseated nurses notified me that she was bradycardic.  Heart rate was in the 40s.  But the sinus bradycardia.  No increased chest pain.  Patient does have some chronic back pain but she does not think that is why she is feeling nauseated.  Based on this we will give her some IV Zofran .  And will observe her little bit longer.  Patient now feeling much better.  Nausea is resolved.  Heart rates back up in the 80s.  Feel that her chest pain is most likely chest wall in nature.  Repeat EKG was done during the event just showed a sinus bradycardia.  Will have her follow-up with her doctor and will give referral  information to cardiology for additional follow-up.   Final diagnoses:  Chest wall pain    ED Discharge Orders     None          Nicklas Barns, MD 04/04/24 Otelia Blew, MD 04/04/24 [id number]    Nicklas Barns, MD 04/04/24 [id number]

## 2024-04-04 NOTE — ED Triage Notes (Signed)
 Pt comes in by pov today for chest pain that started this morning around 1000 that radiated down R arm and into neck and ears. Pt also endorses pain on inspiration, described as sharp, no card hx.

## 2024-04-04 NOTE — ED Notes (Signed)
 Family member came out to nurses station stating pt was feeling nausea. RN to room with emesis bag. Evaluated pt who was sweaty, and bradycardic. RN captured EKG and informed provider face to face who came to bedside. Orders placed for Zofran , see eMAR. As of now symptoms have resolved and pt is comfortable in bed.

## 2024-04-04 NOTE — Discharge Instructions (Signed)
 Continue your pain medication you have at home.  Suspect that this is chest wall pain.  But to be safe would recommend follow-up with your primary care doctor and/or cardiology.  Cardiology information provided above.  Return for any new or worse symptoms.

## 2024-04-07 DIAGNOSIS — S32000A Wedge compression fracture of unspecified lumbar vertebra, initial encounter for closed fracture: Secondary | ICD-10-CM | POA: Diagnosis not present

## 2024-04-10 DIAGNOSIS — M8008XA Age-related osteoporosis with current pathological fracture, vertebra(e), initial encounter for fracture: Secondary | ICD-10-CM | POA: Diagnosis not present

## 2024-05-02 ENCOUNTER — Ambulatory Visit: Admitting: Family Medicine

## 2024-05-02 DIAGNOSIS — G8918 Other acute postprocedural pain: Secondary | ICD-10-CM | POA: Diagnosis not present

## 2024-05-06 DIAGNOSIS — M816 Localized osteoporosis [Lequesne]: Secondary | ICD-10-CM | POA: Diagnosis not present

## 2024-05-21 DIAGNOSIS — G8918 Other acute postprocedural pain: Secondary | ICD-10-CM | POA: Diagnosis not present

## 2024-05-30 DIAGNOSIS — M7062 Trochanteric bursitis, left hip: Secondary | ICD-10-CM | POA: Diagnosis not present

## 2024-05-30 DIAGNOSIS — Z96642 Presence of left artificial hip joint: Secondary | ICD-10-CM | POA: Diagnosis not present

## 2024-06-05 ENCOUNTER — Other Ambulatory Visit (HOSPITAL_COMMUNITY): Payer: Self-pay

## 2024-06-05 DIAGNOSIS — M810 Age-related osteoporosis without current pathological fracture: Secondary | ICD-10-CM | POA: Diagnosis not present

## 2024-06-10 ENCOUNTER — Ambulatory Visit (INDEPENDENT_AMBULATORY_CARE_PROVIDER_SITE_OTHER): Admitting: Family Medicine

## 2024-06-10 ENCOUNTER — Ambulatory Visit: Payer: Self-pay | Admitting: Family Medicine

## 2024-06-10 ENCOUNTER — Encounter: Payer: Self-pay | Admitting: Family Medicine

## 2024-06-10 VITALS — BP 122/70 | HR 66 | Temp 98.1°F | Wt 116.7 lb

## 2024-06-10 DIAGNOSIS — R634 Abnormal weight loss: Secondary | ICD-10-CM | POA: Diagnosis not present

## 2024-06-10 DIAGNOSIS — Z1211 Encounter for screening for malignant neoplasm of colon: Secondary | ICD-10-CM

## 2024-06-10 DIAGNOSIS — R0609 Other forms of dyspnea: Secondary | ICD-10-CM | POA: Diagnosis not present

## 2024-06-10 LAB — CBC WITH DIFFERENTIAL/PLATELET
Basophils Absolute: 0 K/uL (ref 0.0–0.1)
Basophils Relative: 0.6 % (ref 0.0–3.0)
Eosinophils Absolute: 0.1 K/uL (ref 0.0–0.7)
Eosinophils Relative: 0.9 % (ref 0.0–5.0)
HCT: 41.3 % (ref 36.0–46.0)
Hemoglobin: 13.8 g/dL (ref 12.0–15.0)
Lymphocytes Relative: 32.8 % (ref 12.0–46.0)
Lymphs Abs: 2 K/uL (ref 0.7–4.0)
MCHC: 33.4 g/dL (ref 30.0–36.0)
MCV: 88 fl (ref 78.0–100.0)
Monocytes Absolute: 0.6 K/uL (ref 0.1–1.0)
Monocytes Relative: 10.2 % (ref 3.0–12.0)
Neutro Abs: 3.4 K/uL (ref 1.4–7.7)
Neutrophils Relative %: 55.5 % (ref 43.0–77.0)
Platelets: 276 K/uL (ref 150.0–400.0)
RBC: 4.69 Mil/uL (ref 3.87–5.11)
RDW: 14.4 % (ref 11.5–15.5)
WBC: 6.2 K/uL (ref 4.0–10.5)

## 2024-06-10 LAB — COMPREHENSIVE METABOLIC PANEL WITH GFR
ALT: 10 U/L (ref 0–35)
AST: 21 U/L (ref 0–37)
Albumin: 4.6 g/dL (ref 3.5–5.2)
Alkaline Phosphatase: 42 U/L (ref 39–117)
BUN: 19 mg/dL (ref 6–23)
CO2: 27 meq/L (ref 19–32)
Calcium: 9.9 mg/dL (ref 8.4–10.5)
Chloride: 101 meq/L (ref 96–112)
Creatinine, Ser: 0.93 mg/dL (ref 0.40–1.20)
GFR: 58.78 mL/min — ABNORMAL LOW (ref >60.00)
Glucose, Bld: 87 mg/dL (ref 70–99)
Potassium: 5.2 meq/L — ABNORMAL HIGH (ref 3.5–5.1)
Sodium: 138 meq/L (ref 135–145)
Total Bilirubin: 0.6 mg/dL (ref 0.2–1.2)
Total Protein: 6.9 g/dL (ref 6.0–8.3)

## 2024-06-10 LAB — TSH: TSH: 1.13 u[IU]/mL (ref 0.35–5.50)

## 2024-06-10 LAB — SEDIMENTATION RATE: Sed Rate: 2 mm/h (ref 0–30)

## 2024-06-10 NOTE — Progress Notes (Addendum)
 Established Patient Office Visit  Subjective   Patient ID: Kim Daniel, female    DOB: [birth year]  Age: 78 y.o. MRN: [id number]  Chief Complaint  Patient presents with   Tachycardia   Shortness of Breath        Fatigue    HPI   Kim Daniel has history of- irritable bowel syndrome, GERD, osteoporosis, recurrent depression, chronic insomnia.  Seen today for the following concerns  She has had some dyspnea with activity for quite some time.  No chest pain.  No cough.  Sunday had 1 episode of feeling her heart was racing for about 15 minutes but none since then.  No other episodes of tachycardia.  She quit smoking about 24 years ago and states she was a very light smoker during the time that she did smoke.  We had previously ordered pulmonary function test but she never went.  She had chest x-ray in June which showed no acute findings.  Echocardiogram 2021 normal EF.  No major valve problems.  No regional wall motion abnormalities.  She has had some nonspecific fatigue.  She has had some unintentional weight loss of about 16 pounds.  Her usual adult weight was around 135 pounds down to 116 currently.  Denies depression issues.  No diarrhea.  No vomiting.  Denies any chronic headache, dysphagia, abdominal pain, stool changes, diarrhea.  Past Medical History:  Diagnosis Date   Anxiety    Anxiety and depression    Chronic constipation    Depression    GERD (gastroesophageal reflux disease)    History of hiatal hernia    History of migraine headaches    Seasonal allergic rhinitis    SUI (stress urinary incontinence, female)    Ureteral obstruction, right    Wears contact lenses    Past Surgical History:  Procedure Laterality Date   ANTERIOR CERVICAL DECOMP/DISCECTOMY FUSION  03-10-2004   C4 -- C6   BREAST BIOPSY Right 1980   BREAST EXCISIONAL BIOPSY Right 1980   COLONOSCOPY WITH PROPOFOL   03-08-2009   CYSTOSCOPY W/ URETERAL STENT PLACEMENT Right 10/02/2014   Procedure:  CYSTOSCOPY WITH RIGHT RETROGRADE PYELOGRAM , URETERAL STENT PLACEMENT;  Surgeon: Mark C Ottelin, MD;  Location: [facility];  Service: Urology;  Laterality: Right;   ESOPHAGOGASTRODUODENOSCOPY  04-02-2002   TOTAL HIP ARTHROPLASTY  03/03/2012   Procedure: TOTAL HIP ARTHROPLASTY;  Surgeon: Donnice JONETTA Car, MD;  Location: [facility];  Service: Orthopedics;  Laterality: Left;   TUBAL LIGATION  1971   VAGINAL HYSTERECTOMY  1976    reports that she quit smoking about 24 years ago. Her smoking use included cigarettes. She started smoking about 44 years ago. She has a 10 pack-year smoking history. She has never used smokeless tobacco. She reports current alcohol use of about 1.0 standard drink of alcohol per week. She reports that she does not use drugs. family history includes Breast cancer in her maternal uncle; Cancer (age of onset: 7) in her father; Cirrhosis in her mother. Allergies  Allergen Reactions   Codeine Nausea Only   Hydrocodone  Nausea And Vomiting and Other (See Comments)    get dizzy and spaced out Other reaction(s): Other (See Comments) get dizzy and spaced out   Hydrocodone -Acetaminophen      Other Reaction(s): GI Intolerance    Review of Systems  Constitutional:  Positive for malaise/fatigue and weight loss. Negative for chills and fever.  Eyes:  Negative for blurred vision.  Respiratory:  Negative for shortness of breath.  Cardiovascular:  Negative for chest pain.  Gastrointestinal:  Negative for abdominal pain, blood in stool, diarrhea, melena, nausea and vomiting.  Genitourinary:  Negative for dysuria.  Neurological:  Negative for dizziness, weakness and headaches.  Psychiatric/Behavioral:  Negative for depression.       Objective:     BP 122/70   Pulse 66   Temp 98.1 F (36.7 C) (Oral)   Wt 116 lb 11.2 oz (52.9 kg)   SpO2 96%   BMI 20.03 kg/m  BP Readings from Last 3 Encounters:  06/10/24 122/70  04/04/24 102/72  03/20/24 120/80   Wt  Readings from Last 3 Encounters:  06/10/24 116 lb 11.2 oz (52.9 kg)  04/04/24 122 lb (55.3 kg)  03/20/24 121 lb 3.2 oz (55 kg)      Physical Exam Vitals reviewed.  Constitutional:      General: She is not in acute distress.    Appearance: She is well-developed. She is not ill-appearing.  Eyes:     Pupils: Pupils are equal, round, and reactive to light.  Neck:     Thyroid : No thyromegaly.     Vascular: No JVD.  Cardiovascular:     Rate and Rhythm: Normal rate and regular rhythm.     Heart sounds:     No gallop.  Pulmonary:     Effort: Pulmonary effort is normal. No respiratory distress.     Breath sounds: Normal breath sounds. No wheezing or rales.  Musculoskeletal:     Cervical back: Neck supple.     Right lower leg: No edema.     Left lower leg: No edema.  Neurological:     Mental Status: She is alert.      No results found for any visits on 06/10/24.  Last CBC Lab Results  Component Value Date   WBC 6.2 06/10/2024   HGB 13.8 06/10/2024   HCT 41.3 06/10/2024   MCV 88.0 06/10/2024   MCH 29.8 04/04/2024   RDW 14.4 06/10/2024   PLT 276.0 06/10/2024   Last metabolic panel Lab Results  Component Value Date   GLUCOSE 87 06/10/2024   NA 138 06/10/2024   K 5.2 No hemolysis seen (H) 06/10/2024   CL 101 06/10/2024   CO2 27 06/10/2024   BUN 19 06/10/2024   CREATININE 0.93 06/10/2024   GFR 58.78 (L) 06/10/2024   CALCIUM 9.9 06/10/2024   PHOS 2.1 (L) 12/11/2022   PROT 6.9 06/10/2024   ALBUMIN 4.6 06/10/2024   BILITOT 0.6 06/10/2024   ALKPHOS 42 06/10/2024   AST 21 06/10/2024   ALT 10 06/10/2024   ANIONGAP 10 04/04/2024   Last lipids Lab Results  Component Value Date   CHOL 197 01/05/2023   HDL 78.50 01/05/2023   LDLCALC 109 (H) 01/05/2023   LDLDIRECT 115.3 02/19/2012   TRIG 49.0 01/05/2023   CHOLHDL 3 01/05/2023   Last hemoglobin A1c No results found for: HGBA1C Last thyroid  functions Lab Results  Component Value Date   TSH 1.13 06/10/2024       The 10-year ASCVD risk score (Arnett DK, et al., 2019) is: 21%    Assessment & Plan:   #1 unintentional weight loss.  She has lost about 16 pounds this year.  No specific new symptoms other than some dyspnea with exertion.  No chronic cough.  Denies active depression symptoms. - Check further labs with CBC, CMP, TSH, sed rate - Discussed healthy calorie supplements - We did discuss possible appetite booster such as mirtazapine but she declines  at this time. - If above unrevealing and weight loss continues consider CT abdomen pelvis  #2 dyspnea with exertion.  No chest pains.  Echo 2021 normal EF with no major valve problems.  Recent chest x-ray unremarkable.  Previously, pulmonary function testing has been ordered but she never went.  She will go ahead and set this up.  If that is unremarkable consider cardiology referral for further evaluation   Return in about 3 months (around 09/10/2024).    Wolm Scarlet, MD

## 2024-06-10 NOTE — Patient Instructions (Signed)
 Set up pulmonary function tests soon.

## 2024-06-26 LAB — COLOGUARD: COLOGUARD: NEGATIVE

## 2024-06-27 DIAGNOSIS — S60511A Abrasion of right hand, initial encounter: Secondary | ICD-10-CM | POA: Diagnosis not present

## 2024-06-27 DIAGNOSIS — Z23 Encounter for immunization: Secondary | ICD-10-CM | POA: Diagnosis not present

## 2024-06-27 DIAGNOSIS — L03113 Cellulitis of right upper limb: Secondary | ICD-10-CM | POA: Diagnosis not present

## 2024-06-27 DIAGNOSIS — W5503XA Scratched by cat, initial encounter: Secondary | ICD-10-CM | POA: Diagnosis not present

## 2024-07-08 ENCOUNTER — Telehealth: Payer: Self-pay | Admitting: *Deleted

## 2024-07-08 ENCOUNTER — Ambulatory Visit: Admitting: Internal Medicine

## 2024-07-08 NOTE — Telephone Encounter (Signed)
 Noted

## 2024-07-08 NOTE — Telephone Encounter (Signed)
 Copied from CRM [id number]. Topic: General - Other >> Jul 08, 2024 10:52 AM Franky GRADE wrote: Reason for CRM: Patient's daughter is calling to ask why the appointment that was scheduled today was cancelled, Called [facility] and Madeline informed me that Dr.Burchette wanted to see the patient based on her condition. The daughter is upset because she took the day off from work and she feels like the patient's memory is not doing so well. I did add patient to the cancellation list but she would like to for mother to be seen today.

## 2024-07-09 ENCOUNTER — Ambulatory Visit: Admitting: Internal Medicine

## 2024-07-09 ENCOUNTER — Encounter

## 2024-07-21 ENCOUNTER — Ambulatory Visit (INDEPENDENT_AMBULATORY_CARE_PROVIDER_SITE_OTHER): Admitting: Family Medicine

## 2024-07-21 ENCOUNTER — Encounter: Payer: Self-pay | Admitting: Family Medicine

## 2024-07-21 ENCOUNTER — Ambulatory Visit: Payer: Self-pay | Admitting: Family Medicine

## 2024-07-21 VITALS — BP 146/80 | HR 81 | Temp 97.7°F | Wt 115.0 lb

## 2024-07-21 DIAGNOSIS — Z8639 Personal history of other endocrine, nutritional and metabolic disease: Secondary | ICD-10-CM

## 2024-07-21 DIAGNOSIS — G3184 Mild cognitive impairment, so stated: Secondary | ICD-10-CM | POA: Diagnosis not present

## 2024-07-21 DIAGNOSIS — R634 Abnormal weight loss: Secondary | ICD-10-CM | POA: Diagnosis not present

## 2024-07-21 DIAGNOSIS — M810 Age-related osteoporosis without current pathological fracture: Secondary | ICD-10-CM | POA: Diagnosis not present

## 2024-07-21 DIAGNOSIS — Z23 Encounter for immunization: Secondary | ICD-10-CM

## 2024-07-21 LAB — VITAMIN B12: Vitamin B-12: 296 pg/mL (ref 211–911)

## 2024-07-21 NOTE — Progress Notes (Signed)
 Established Patient Office Visit  Subjective   Patient ID: Kim Daniel, female    DOB: Feb 15, 1946  Age: 78 y.o. MRN: [id number]  Chief Complaint  Patient presents with   Memory Loss    HPI   Kim Daniel is seen today accompanied by daughter.  Patient has chronic problems include history of migraine headaches, IBS, osteoporosis, chronic anxiety depression, chronic insomnia, and B12 deficiency..  Daughter expressing concerns regarding memory loss.  She presented with similar complaints about 3 years ago.  At that time she had low B12 of 201 and was placed on supplement but she has not been taking this consistently.  She had recent TSH which was normal.  She had an MRI in 2022 which showed age-related microvascular changes but no acute findings.  Daughter states that she has been repeating herself more recently.  She recently made a comment that they did not have any pets when they have had the same dog for 11 years.  Daughter also relates that she seems more easily agitated and has lower anger threshold recently.  No known family history of dementia.  She has 2 younger sisters who are doing generally well.  Mom died age 79 of aneurysm.  Mom also had a history of nonalcoholic cirrhosis presumably secondary to fatty liver.  Her father died of lung cancer.  Other concern is patient's daughter states she has very erratic eating and frequently skips breakfast and has peanut butter crackers for lunch frequently with a small 8 ounce beer.  She then generally eats a decent supper with another 8 ounce can of beer and about 4 ounces of wine.  Daughter has concerns that she is not getting adequate nutrition.  Her weight has been gradually going down with current BMI less than 20.  This has been a gradual decline over the past couple years really.  She had recent chest x-ray in June which was unremarkable.  Recent Cologuard negative.  Recent CBC and CMP no acute findings.  Recent sed rate of 2  Past  Medical History:  Diagnosis Date   Anxiety    Anxiety and depression    Chronic constipation    Depression    GERD (gastroesophageal reflux disease)    History of hiatal hernia    History of migraine headaches    Seasonal allergic rhinitis    SUI (stress urinary incontinence, female)    Ureteral obstruction, right    Wears contact lenses    Past Surgical History:  Procedure Laterality Date   ANTERIOR CERVICAL DECOMP/DISCECTOMY FUSION  03-10-2004   C4 -- C6   BREAST BIOPSY Right 1980   BREAST EXCISIONAL BIOPSY Right 1980   COLONOSCOPY WITH PROPOFOL   03-08-2009   CYSTOSCOPY W/ URETERAL STENT PLACEMENT Right 10/02/2014   Procedure: CYSTOSCOPY WITH RIGHT RETROGRADE PYELOGRAM , URETERAL STENT PLACEMENT;  Surgeon: Mark C Ottelin, MD;  Location: [facility];  Service: Urology;  Laterality: Right;   ESOPHAGOGASTRODUODENOSCOPY  04-02-2002   TOTAL HIP ARTHROPLASTY  03/03/2012   Procedure: TOTAL HIP ARTHROPLASTY;  Surgeon: Donnice JONETTA Car, MD;  Location: [facility];  Service: Orthopedics;  Laterality: Left;   TUBAL LIGATION  1971   VAGINAL HYSTERECTOMY  1976    reports that she quit smoking about 24 years ago. Her smoking use included cigarettes. She started smoking about 44 years ago. She has a 10 pack-year smoking history. She has never used smokeless tobacco. She reports current alcohol use of about 1.0 standard drink of alcohol per week.  She reports that she does not use drugs. family history includes Breast cancer in her maternal uncle; Cancer (age of onset: 22) in her father; Cirrhosis in her mother. Allergies  Allergen Reactions   Codeine Nausea Only   Hydrocodone  Nausea And Vomiting and Other (See Comments)    get dizzy and spaced out Other reaction(s): Other (See Comments) get dizzy and spaced out   Hydrocodone -Acetaminophen      Other Reaction(s): GI Intolerance    Review of Systems  Constitutional:  Positive for weight loss. Negative for chills, fever and  malaise/fatigue.  Eyes:  Negative for blurred vision.  Respiratory:  Negative for shortness of breath.   Cardiovascular:  Negative for chest pain.  Gastrointestinal:  Negative for abdominal pain, blood in stool, diarrhea, nausea and vomiting.  Genitourinary:  Negative for hematuria.  Neurological:  Negative for dizziness, focal weakness, seizures, weakness and headaches.      Objective:     BP (!) 146/80   Pulse 81   Temp 97.7 F (36.5 C) (Oral)   Wt 115 lb (52.2 kg)   SpO2 97%   BMI 19.74 kg/m  BP Readings from Last 3 Encounters:  07/21/24 (!) 146/80  06/10/24 122/70  04/04/24 102/72   Wt Readings from Last 3 Encounters:  07/21/24 115 lb (52.2 kg)  06/10/24 116 lb 11.2 oz (52.9 kg)  04/04/24 122 lb (55.3 kg)      Physical Exam Vitals reviewed.  Constitutional:      General: She is not in acute distress.    Appearance: She is not ill-appearing.  Cardiovascular:     Rate and Rhythm: Normal rate and regular rhythm.  Pulmonary:     Effort: Pulmonary effort is normal.     Breath sounds: Normal breath sounds. No wheezing or rales.  Musculoskeletal:     Right lower leg: No edema.     Left lower leg: No edema.  Neurological:     General: No focal deficit present.     Mental Status: She is alert.     Cranial Nerves: No cranial nerve deficit.     Comments: MMSE 28/30      No results found for any visits on 07/21/24.  Last CBC Lab Results  Component Value Date   WBC 6.2 06/10/2024   HGB 13.8 06/10/2024   HCT 41.3 06/10/2024   MCV 88.0 06/10/2024   MCH 29.8 04/04/2024   RDW 14.4 06/10/2024   PLT 276.0 06/10/2024   Last metabolic panel Lab Results  Component Value Date   GLUCOSE 87 06/10/2024   NA 138 06/10/2024   K 5.2 No hemolysis seen (H) 06/10/2024   CL 101 06/10/2024   CO2 27 06/10/2024   BUN 19 06/10/2024   CREATININE 0.93 06/10/2024   GFR 58.78 (L) 06/10/2024   CALCIUM 9.9 06/10/2024   PHOS 2.1 (L) 12/11/2022   PROT 6.9 06/10/2024   ALBUMIN  4.6 06/10/2024   BILITOT 0.6 06/10/2024   ALKPHOS 42 06/10/2024   AST 21 06/10/2024   ALT 10 06/10/2024   ANIONGAP 10 04/04/2024   Last lipids Lab Results  Component Value Date   CHOL 197 01/05/2023   HDL 78.50 01/05/2023   LDLCALC 109 (H) 01/05/2023   LDLDIRECT 115.3 02/19/2012   TRIG 49.0 01/05/2023   CHOLHDL 3 01/05/2023   Last hemoglobin A1c No results found for: HGBA1C Last thyroid  functions Lab Results  Component Value Date   TSH 1.13 06/10/2024      The 10-year ASCVD risk score (Arnett  DK, et al., 2019) is: 28.5%    Assessment & Plan:   #1 mild cognitive impairment.  28/30 on MMSE which is similar to result from 3 years ago.  She does have history of B12 deficiency and has not been taking her B12 consistently.  Recommend recheck B12 level.  If normal, consider referral to neuropsychologist for further evaluation  #2 weight loss.  This has been gradual and daughter has some significant concerns regarding patient's current diet.  Tends to eat 2 meals per day and lunch is usually just crackers with peanut butter and 8 ounce beer.  We talked about nutrition at some length.  We recommend trying to establish 3 meals per day along with some healthy snacks and consider more consistent nutrition supplement such as Ensure.  Will check on insurance coverage for nutrition referral as well.  #3 B12 deficiency history.  Rechecking B12 today.   Return in about 6 months (around 01/18/2025).    Wolm Scarlet, MD

## 2024-08-05 ENCOUNTER — Other Ambulatory Visit: Payer: Self-pay | Admitting: Family Medicine

## 2024-08-06 ENCOUNTER — Other Ambulatory Visit: Payer: Self-pay | Admitting: Family Medicine

## 2024-08-08 ENCOUNTER — Ambulatory Visit: Admitting: Family Medicine

## 2024-08-11 ENCOUNTER — Encounter: Payer: Self-pay | Admitting: Family Medicine

## 2024-08-11 ENCOUNTER — Ambulatory Visit (INDEPENDENT_AMBULATORY_CARE_PROVIDER_SITE_OTHER): Admitting: Family Medicine

## 2024-08-11 VITALS — BP 122/70 | HR 82 | Temp 97.9°F | Wt 116.9 lb

## 2024-08-11 DIAGNOSIS — F339 Major depressive disorder, recurrent, unspecified: Secondary | ICD-10-CM | POA: Diagnosis not present

## 2024-08-11 DIAGNOSIS — R5383 Other fatigue: Secondary | ICD-10-CM

## 2024-08-11 MED ORDER — CITALOPRAM HYDROBROMIDE 20 MG PO TABS: 20.0000 mg | ORAL_TABLET | Freq: Every day | ORAL | 3 refills | Status: DC

## 2024-08-11 MED ORDER — BUPROPION HCL ER (XL) 150 MG PO TB24: 150.0000 mg | ORAL_TABLET | Freq: Every day | ORAL | 3 refills | Status: AC

## 2024-08-11 NOTE — Progress Notes (Signed)
 Established Patient Office Visit  Subjective   Patient ID: Kim Daniel, female    DOB: Jun 28, 1946  Age: 78 y.o. MRN: [id number]  Chief Complaint  Patient presents with   Fatigue    HPI    Kim Daniel is seen today with fatigue and increased depression symptoms of low motivation predominantly.  She has had a longstanding history of recurrent depression.  Currently on high-dose citalopram  40 mg daily and has been on this dose apparently for years.  She does recall at some point being on Wellbutrin  but does not recall any side effects.  She is not sure why she came off that.  She had multiple recent labs including B12 level which was 296.  Recent TSH normal.  CBC and CMP unremarkable.  Recent sed rate of 2.  She had some recent weight loss and this is finally reversed.  Refer to last note for details.  We discussed diet in some detail.  She has added some supplements such as Ensure but still not eating breakfast regularly.  She has gained a pound since last visit.  General fatigue but she states more the thing that seems to be low motivation.  She usually goes to bed around 8 or 9 PM and usually gets up at 7 and feels like she is sleeping well.  Denies any recent chest pains, dyspnea, abdominal pain, nausea, vomiting, or diarrhea.  Past Medical History:  Diagnosis Date   Anxiety    Anxiety and depression    Chronic constipation    Depression    GERD (gastroesophageal reflux disease)    History of hiatal hernia    History of migraine headaches    Seasonal allergic rhinitis    SUI (stress urinary incontinence, female)    Ureteral obstruction, right    Wears contact lenses    Past Surgical History:  Procedure Laterality Date   ANTERIOR CERVICAL DECOMP/DISCECTOMY FUSION  03-10-2004   C4 -- C6   BREAST BIOPSY Right 1980   BREAST EXCISIONAL BIOPSY Right 1980   COLONOSCOPY WITH PROPOFOL   03-08-2009   CYSTOSCOPY W/ URETERAL STENT PLACEMENT Right 10/02/2014   Procedure:  CYSTOSCOPY WITH RIGHT RETROGRADE PYELOGRAM , URETERAL STENT PLACEMENT;  Surgeon: Mark C Ottelin, MD;  Location: [facility];  Service: Urology;  Laterality: Right;   ESOPHAGOGASTRODUODENOSCOPY  04-02-2002   TOTAL HIP ARTHROPLASTY  03/03/2012   Procedure: TOTAL HIP ARTHROPLASTY;  Surgeon: Donnice JONETTA Car, MD;  Location: [facility];  Service: Orthopedics;  Laterality: Left;   TUBAL LIGATION  1971   VAGINAL HYSTERECTOMY  1976    reports that she quit smoking about 24 years ago. Her smoking use included cigarettes. She started smoking about 44 years ago. She has a 10 pack-year smoking history. She has never used smokeless tobacco. She reports current alcohol use of about 1.0 standard drink of alcohol per week. She reports that she does not use drugs. family history includes Breast cancer in her maternal uncle; Cancer (age of onset: 1) in her father; Cirrhosis in her mother. Allergies  Allergen Reactions   Codeine Nausea Only   Hydrocodone  Nausea And Vomiting and Other (See Comments)    get dizzy and spaced out Other reaction(s): Other (See Comments) get dizzy and spaced out   Hydrocodone -Acetaminophen      Other Reaction(s): GI Intolerance    Review of Systems  Constitutional:  Positive for malaise/fatigue. Negative for chills and fever.  Respiratory:  Negative for shortness of breath.   Cardiovascular:  Negative  for chest pain.  Neurological:  Negative for headaches.  Psychiatric/Behavioral:  Positive for depression. Negative for suicidal ideas. The patient does not have insomnia.       Objective:     BP 122/70   Pulse 82   Temp 97.9 F (36.6 C) (Oral)   Wt 116 lb 14.4 oz (53 kg)   SpO2 98%   BMI 20.07 kg/m  BP Readings from Last 3 Encounters:  08/11/24 122/70  07/21/24 (!) 146/80  06/10/24 122/70   Wt Readings from Last 3 Encounters:  08/11/24 116 lb 14.4 oz (53 kg)  07/21/24 115 lb (52.2 kg)  06/10/24 116 lb 11.2 oz (52.9 kg)      Physical  Exam Constitutional:      General: She is not in acute distress.    Appearance: She is not ill-appearing.  Cardiovascular:     Rate and Rhythm: Normal rate and regular rhythm.  Pulmonary:     Effort: Pulmonary effort is normal.     Breath sounds: Normal breath sounds. No wheezing or rales.  Neurological:     General: No focal deficit present.     Mental Status: She is alert.  Psychiatric:        Mood and Affect: Mood normal.     Comments: PHQ-9 of 8.  However, after further reflection she feels like score should be 10 as she under scored fatigue question      No results found for any visits on 08/11/24.  Last CBC Lab Results  Component Value Date   WBC 6.2 06/10/2024   HGB 13.8 06/10/2024   HCT 41.3 06/10/2024   MCV 88.0 06/10/2024   MCH 29.8 04/04/2024   RDW 14.4 06/10/2024   PLT 276.0 06/10/2024   Last metabolic panel Lab Results  Component Value Date   GLUCOSE 87 06/10/2024   NA 138 06/10/2024   K 5.2 No hemolysis seen (H) 06/10/2024   CL 101 06/10/2024   CO2 27 06/10/2024   BUN 19 06/10/2024   CREATININE 0.93 06/10/2024   GFR 58.78 (L) 06/10/2024   CALCIUM 9.9 06/10/2024   PHOS 2.1 (L) 12/11/2022   PROT 6.9 06/10/2024   ALBUMIN 4.6 06/10/2024   BILITOT 0.6 06/10/2024   ALKPHOS 42 06/10/2024   AST 21 06/10/2024   ALT 10 06/10/2024   ANIONGAP 10 04/04/2024   Last hemoglobin A1c No results found for: HGBA1C Last thyroid  functions Lab Results  Component Value Date   TSH 1.13 06/10/2024   Last vitamin B12 and Folate Lab Results  Component Value Date   VITAMINB12 296 07/21/2024      The 10-year ASCVD risk score (Arnett DK, et al., 2019) is: 21%    Assessment & Plan:   #1 recurrent depression.  Patient currently on citalopram  40 mg daily.  We have discussed the fact that this is probably not a good dosage for her at her age and I recommend scaling this back to 20 mg daily and adding Wellbutrin  XL 150 mg once daily which will hopefully help with  her motivation and energy.  She has no known contraindications.  No history of seizures.  We have recommended she take the Wellbutrin  in the morning and continue citalopram  at night.  Set up 6-week follow-up to reassess  #2 fatigue.  Possibly related #1.  Recent labs including TSH, B12, CBC, chemistries unremarkable.  Her B12 was on the low end of normal at 296 we recommend increasing her B12 from 500 mcg daily to 1000 mcg.  #  3 weight loss.  Finally stabilized.  Recent labs unrevealing.  Again have strongly recommended she consider adding breakfast or at least healthy snack in at breakfast time   Return in about 6 weeks (around 09/22/2024).    Wolm Scarlet, MD

## 2024-08-27 ENCOUNTER — Encounter: Payer: Self-pay | Admitting: Family Medicine

## 2024-08-27 ENCOUNTER — Ambulatory Visit (INDEPENDENT_AMBULATORY_CARE_PROVIDER_SITE_OTHER): Admitting: Family Medicine

## 2024-08-27 VITALS — BP 124/70 | HR 93 | Temp 97.7°F | Wt 114.9 lb

## 2024-08-27 DIAGNOSIS — R4189 Other symptoms and signs involving cognitive functions and awareness: Secondary | ICD-10-CM | POA: Diagnosis not present

## 2024-08-27 DIAGNOSIS — R630 Anorexia: Secondary | ICD-10-CM | POA: Diagnosis not present

## 2024-08-27 DIAGNOSIS — R5383 Other fatigue: Secondary | ICD-10-CM | POA: Diagnosis not present

## 2024-08-27 NOTE — Patient Instructions (Signed)
 Be diligent to eat 3-4 times per day  Drink more water!  I will be setting up neurology referral.

## 2024-08-27 NOTE — Progress Notes (Signed)
 Established Patient Office Visit  Subjective   Patient ID: Kim Daniel, female    DOB: 06/30/1946  Age: 78 y.o. MRN: [id number]  Chief Complaint  Patient presents with   Fatigue        Anorexia    HPI   Kim Daniel is seen again today companied by daughter.  She has history of migraine headaches, GERD, IBS, osteoporosis, history of recurrent depression, recent weight loss, poor appetite.  Multiple recent visits.  She continues to complain of fatigue, poor appetite, easy bruising of forearms, and generally not feeling good .  Couple visits ago we readdressed the issue of memory concern.  This was evaluated back in 2022 with MRI showing age-related microvascular changes but no acute findings.  B12 at that point was low and she has been on some replacement since then.  Recent MMSE 28/30.  However, patient did not seem to recall that we just seen her 2 weeks ago and thought that her last visit had been a couple of months ago.  We had previously discussed possible referral to neurologist for further evaluation of her cognitive impairment/memory concerns.  History of ongoing depression.  Low motivation.  Had been on citalopram  40 mg daily we recommended last visit reducing this to 20 mg and addition of low-dose Wellbutrin .  She does feel this may be helping somewhat although she has only been on this regimen for 2 weeks now.  She has chronic insomnia and apparently has been on Klonopin  low dosage of 0.5 mg for GYN and additionally taking low-dose Ambien  5 mg nightly.  Weeks per her concerns regarding both memory loss and fall risk with the Klonopin .  She continues to have poor appetite but really more than anything low motivation to fix meals.  She states she can take it or leave it with meals and frequently does not have a lot of energy or motivation to fix anything.  We discussed previously trying to eat more regularly but still skipping meals apparently.  Also does not drink a lot of  water and daughter thinks this may be contributing to her fatigue issues.  Her weight does seem to of stabilized somewhat though in recent months.  CT abdomen and pelvis May of this year no acute abnormalities.  She had chest x-ray in June with no acute abnormalities.  Recent labs in August including thyroid , sed rate, CMP, CBC reassuring  Past Medical History:  Diagnosis Date   Anxiety    Anxiety and depression    Chronic constipation    Depression    GERD (gastroesophageal reflux disease)    History of hiatal hernia    History of migraine headaches    Seasonal allergic rhinitis    SUI (stress urinary incontinence, female)    Ureteral obstruction, right    Wears contact lenses    Past Surgical History:  Procedure Laterality Date   ANTERIOR CERVICAL DECOMP/DISCECTOMY FUSION  03-10-2004   C4 -- C6   BREAST BIOPSY Right 1980   BREAST EXCISIONAL BIOPSY Right 1980   COLONOSCOPY WITH PROPOFOL   03-08-2009   CYSTOSCOPY W/ URETERAL STENT PLACEMENT Right 10/02/2014   Procedure: CYSTOSCOPY WITH RIGHT RETROGRADE PYELOGRAM , URETERAL STENT PLACEMENT;  Surgeon: Mark C Ottelin, MD;  Location: [facility];  Service: Urology;  Laterality: Right;   ESOPHAGOGASTRODUODENOSCOPY  04-02-2002   TOTAL HIP ARTHROPLASTY  03/03/2012   Procedure: TOTAL HIP ARTHROPLASTY;  Surgeon: Donnice JONETTA Car, MD;  Location: [facility];  Service: Orthopedics;  Laterality: Left;  TUBAL LIGATION  1971   VAGINAL HYSTERECTOMY  1976    reports that she quit smoking about 24 years ago. Her smoking use included cigarettes. She started smoking about 44 years ago. She has a 10 pack-year smoking history. She has never used smokeless tobacco. She reports current alcohol use of about 1.0 standard drink of alcohol per week. She reports that she does not use drugs. family history includes Breast cancer in her maternal uncle; Cancer (age of onset: 38) in her father; Cirrhosis in her mother. Allergies  Allergen Reactions    Codeine Nausea Only   Hydrocodone  Nausea And Vomiting and Other (See Comments)    get dizzy and spaced out Other reaction(s): Other (See Comments) get dizzy and spaced out   Hydrocodone -Acetaminophen      Other Reaction(s): GI Intolerance     Review of Systems  Constitutional:  Positive for malaise/fatigue. Negative for chills and fever.  Eyes:  Negative for blurred vision.  Respiratory:  Negative for shortness of breath.   Cardiovascular:  Negative for chest pain.  Gastrointestinal:  Negative for abdominal pain, blood in stool, diarrhea, nausea and vomiting.  Neurological:  Negative for dizziness, weakness and headaches.  Psychiatric/Behavioral:  Negative for suicidal ideas. The patient has insomnia.       Objective:     BP 124/70   Pulse 93   Temp 97.7 F (36.5 C) (Oral)   Wt 114 lb 14.4 oz (52.1 kg)   SpO2 98%   BMI 19.72 kg/m  BP Readings from Last 3 Encounters:  08/27/24 124/70  08/11/24 122/70  07/21/24 (!) 146/80   Wt Readings from Last 3 Encounters:  08/27/24 114 lb 14.4 oz (52.1 kg)  08/11/24 116 lb 14.4 oz (53 kg)  07/21/24 115 lb (52.2 kg)      Physical Exam Vitals reviewed.  Constitutional:      General: She is not in acute distress.    Appearance: She is not ill-appearing.  Cardiovascular:     Rate and Rhythm: Normal rate and regular rhythm.  Pulmonary:     Effort: Pulmonary effort is normal.     Breath sounds: Normal breath sounds. No wheezing or rales.  Musculoskeletal:     Right lower leg: No edema.     Left lower leg: No edema.  Skin:    Comments: Few scattered bruises right forearm  Neurological:     General: No focal deficit present.     Mental Status: She is alert.      No results found for any visits on 08/27/24.  Last CBC Lab Results  Component Value Date   WBC 6.2 06/10/2024   HGB 13.8 06/10/2024   HCT 41.3 06/10/2024   MCV 88.0 06/10/2024   MCH 29.8 04/04/2024   RDW 14.4 06/10/2024   PLT 276.0 06/10/2024   Last  metabolic panel Lab Results  Component Value Date   GLUCOSE 87 06/10/2024   NA 138 06/10/2024   K 5.2 No hemolysis seen (H) 06/10/2024   CL 101 06/10/2024   CO2 27 06/10/2024   BUN 19 06/10/2024   CREATININE 0.93 06/10/2024   GFR 58.78 (L) 06/10/2024   CALCIUM 9.9 06/10/2024   PHOS 2.1 (L) 12/11/2022   PROT 6.9 06/10/2024   ALBUMIN 4.6 06/10/2024   BILITOT 0.6 06/10/2024   ALKPHOS 42 06/10/2024   AST 21 06/10/2024   ALT 10 06/10/2024   ANIONGAP 10 04/04/2024   Last thyroid  functions Lab Results  Component Value Date   TSH 1.13 06/10/2024  Last vitamin B12 and Folate Lab Results  Component Value Date   VITAMINB12 296 07/21/2024      The 10-year ASCVD risk score (Arnett DK, et al., 2019) is: 21.6%    Assessment & Plan:   78 year old female with chronic medical problems as above seen today with again complaints of fatigue, poor appetite, concern regarding memory deficits.  Recent MMSE 28/30.  She does have a history of recurrent depression and we just recently couple weeks ago made a change in her regimen with reducing citalopram  dosage and adding low-dose Wellbutrin .  Too early to assess effectiveness of that regimen for her depression.  We discussed the following  - Set up neurology referral for further evaluation of her memory concerns/mild cognitive impairment -Again discussed diet in some length with daughter and patient.  She needs to consume more water and establish more regular eating patterns perhaps with 3-4 meals per day and occasional healthy snacks.  We did offer nutritionist referral but they declined -Reviewed recent labs which showed no major abnormalities or concerns -Continue over-the-counter B12 supplementation -We did discuss her concerns regarding benzodiazepine use at her age with increased risk of falls and possibly worsening memory with overmedication No follow-ups on file.    Wolm Scarlet, MD

## 2024-08-29 ENCOUNTER — Other Ambulatory Visit: Payer: Self-pay | Admitting: Family Medicine

## 2024-08-29 DIAGNOSIS — F5101 Primary insomnia: Secondary | ICD-10-CM

## 2024-09-29 ENCOUNTER — Telehealth: Payer: Self-pay

## 2024-09-29 NOTE — Telephone Encounter (Signed)
 Noted

## 2024-09-29 NOTE — Telephone Encounter (Signed)
 Copied from CRM [id number]. Topic: Clinical - Medication Question >> Sep 29, 2024  1:48 PM Sophia H wrote: Reason for CRM: Patient called to update pharmacy - states any Rx's moving forward will go to [id number] - [place], [place] - [address]

## 2024-10-06 ENCOUNTER — Telehealth: Payer: Self-pay

## 2024-10-06 NOTE — Telephone Encounter (Signed)
 Copied from CRM #[id number]. Topic: Clinical - Medical Advice >> Oct 06, 2024  8:34 AM [name] wrote: Reason for CRM: Patient was referred to Neuro but unable to been seen until March, daughter [name] is wondering if her pcp would be willing to prescribe something like Aerosept while she is waiting.   [name] can be reached at [phone]

## 2024-10-07 NOTE — Telephone Encounter (Signed)
 Kim Daniel informed of the message below. Kim Daniel reported she would like patient to taper off Celexa  and inquired on how she can initiate this process?

## 2024-10-07 NOTE — Telephone Encounter (Signed)
 Kim Daniel informed of the message below and voiced understanding

## 2024-10-08 ENCOUNTER — Telehealth: Payer: Self-pay | Admitting: *Deleted

## 2024-10-08 MED ORDER — DONEPEZIL HCL 5 MG PO TABS: 5.0000 mg | ORAL_TABLET | Freq: Every day | ORAL | 1 refills | Status: AC

## 2024-10-08 NOTE — Telephone Encounter (Signed)
 Copied from CRM [id number]. Topic: Clinical - Medication Question >> Oct 08, 2024  8:45 AM Eva FALCON wrote: Reason for CRM: Pt daughter Rosaline states they were talking about putting her mother on Aricept  but wanted her to reduce her dosage of Celexa  for the next 2 weeks and get her off of the medication. Rosaline says mother Murry informed her that she has not been taking it at all. Rosaline is requesting call back [phone].

## 2024-10-08 NOTE — Telephone Encounter (Signed)
 Rx sent.

## 2024-10-09 ENCOUNTER — Encounter: Payer: Self-pay | Admitting: Family Medicine

## 2024-10-30 ENCOUNTER — Ambulatory Visit: Payer: Self-pay

## 2024-10-30 NOTE — Telephone Encounter (Signed)
 FYI Only or Action Required?: Action required by provider: request for appointment.  Patient was last seen in primary care on 08/27/2024 by Micheal Wolm ORN, MD.  Called Nurse Triage reporting Fatigue.  Symptoms began several months ago.  Interventions attempted: Rest, hydration, or home remedies.  Symptoms are: gradually worsening. Has had weakness and fatigue x 2 months, getting worse. Poor appetite. Wants to see PCP only.  Triage Disposition: See Physician Within 24 Hours  Patient/caregiver understands and will follow disposition?: Yes     Copied from CRM [id number]. Topic: Clinical - Red Word Triage >> Oct 30, 2024 11:47 AM Berneda FALCON wrote: Red Word that prompted transfer to Nurse Triage: Patient wanted to schedule for a physical (last one was in 2024), but states that she has weakness all over her body, no energy, no appetite over the last month it has gotten worse. Reason for Disposition  [1] MODERATE weakness (e.g., interferes with work, school, normal activities) AND [2] persists > 3 days  Answer Assessment - Initial Assessment Questions 1. DESCRIPTION: Describe how you are feeling.     weak 2. SEVERITY: How bad is it?  Can you stand and walk?     yes 3. ONSET: When did these symptoms begin? (e.g., hours, days, weeks, months)      2 months, getting worse 4. CAUSE: What do you think is causing the weakness or fatigue? (e.g., not drinking enough fluids, medical problem, trouble sleeping)     unsure 5. NEW MEDICINES:  Have you started on any new medicines recently? (e.g., opioid pain medicines, benzodiazepines, muscle relaxants, antidepressants, antihistamines, neuroleptics, beta blockers)     no 6. OTHER SYMPTOMS: Do you have any other symptoms? (e.g., chest pain, fever, cough, SOB, vomiting, diarrhea, bleeding, other areas of pain)     Some dizziness 7. PREGNANCY: Is there any chance you are pregnant? When was your last menstrual period?      no  Protocols used: Weakness (Generalized) and Fatigue-A-AH

## 2024-11-03 ENCOUNTER — Ambulatory Visit: Admitting: Family Medicine

## 2024-11-03 ENCOUNTER — Encounter: Payer: Self-pay | Admitting: Family Medicine

## 2024-11-03 VITALS — BP 104/60 | HR 75 | Temp 98.2°F | Wt 110.3 lb

## 2024-11-03 DIAGNOSIS — R634 Abnormal weight loss: Secondary | ICD-10-CM | POA: Diagnosis not present

## 2024-11-03 DIAGNOSIS — R5383 Other fatigue: Secondary | ICD-10-CM

## 2024-11-03 DIAGNOSIS — R630 Anorexia: Secondary | ICD-10-CM | POA: Diagnosis not present

## 2024-11-03 MED ORDER — MIRTAZAPINE 15 MG PO TBDP: 15.0000 mg | ORAL_TABLET | Freq: Every day | ORAL | 2 refills | Status: AC

## 2024-11-03 NOTE — Progress Notes (Signed)
 "  Established Patient Office Visit  Subjective   Patient ID: Kim Daniel, female    DOB: 03/27/1946  Age: 79 y.o. MRN: [id number]  Chief Complaint  Patient presents with   Abdominal Pain   Fatigue   Loss of appetite   Nausea    HPI    Kim Daniel is seen today companied by her daughter.  She has chronic problems of migraine headache, GERD, IBS, osteoporosis, history of recurrent depression, memory loss/mild cognitive impairment.  She has lost another 4 pounds since last visit and continues to have loss of appetite and fatigue.  She has had some occasional dizziness but no syncope.  Infrequent headaches.  She had occasional slight nausea but no vomiting.  No stool changes.  Occasional diffuse lower abdominal pains but not consistently.  Recently scored 28/30 on MMSE.  Patient was started on low-dose Aricept  5 mg daily.  Slight nausea as above but not consistently and she is not sure the medication has any effect on that.  She has pending appointment with neurologist in March.  Last visit we had suggested more frequent meals and more frequent snacking but daughter states if anything she is eating less.  Eating very low variety of foods.  She states she has very poor appetite and decreased smell.  No chronic congestion.  Denies any chronic cough or shortness of breath.  No fever or night sweats.  Recent Cologuard negative.  Recent labs including CBC, TSH, CMP, sed rate reassuring.  Recent B12 296 and she is taking supplement.  Daughter is trying to oversee her medications.  Past Medical History:  Diagnosis Date   Anxiety    Anxiety and depression    Chronic constipation    Depression    GERD (gastroesophageal reflux disease)    History of hiatal hernia    History of migraine headaches    Seasonal allergic rhinitis    SUI (stress urinary incontinence, female)    Ureteral obstruction, right    Wears contact lenses    Past Surgical History:  Procedure Laterality Date   ANTERIOR  CERVICAL DECOMP/DISCECTOMY FUSION  03-10-2004   C4 -- C6   BREAST BIOPSY Right 1980   BREAST EXCISIONAL BIOPSY Right 1980   COLONOSCOPY WITH PROPOFOL   03-08-2009   CYSTOSCOPY W/ URETERAL STENT PLACEMENT Right 10/02/2014   Procedure: CYSTOSCOPY WITH RIGHT RETROGRADE PYELOGRAM , URETERAL STENT PLACEMENT;  Surgeon: Mark C Ottelin, MD;  Location: [facility] [place];  Service: Urology;  Laterality: Right;   ESOPHAGOGASTRODUODENOSCOPY  04-02-2002   TOTAL HIP ARTHROPLASTY  03/03/2012   Procedure: TOTAL HIP ARTHROPLASTY;  Surgeon: Donnice JONETTA Car, MD;  Location: [facility];  Service: Orthopedics;  Laterality: Left;   TUBAL LIGATION  1971   VAGINAL HYSTERECTOMY  1976    reports that she quit smoking about 25 years ago. Her smoking use included cigarettes. She started smoking about 45 years ago. She has a 10 pack-year smoking history. She has never used smokeless tobacco. She reports current alcohol use of about 1.0 standard drink of alcohol per week. She reports that she does not use drugs. family history includes Breast cancer in her maternal uncle; Cancer (age of onset: 54) in her father; Cirrhosis in her mother. Allergies[1]  Review of Systems  Constitutional:  Positive for weight loss. Negative for chills and fever.  Respiratory:  Negative for cough, hemoptysis and shortness of breath.   Cardiovascular:  Negative for chest pain.  Gastrointestinal:  Positive for nausea. Negative for blood in  stool, diarrhea, melena and vomiting.  Genitourinary:  Negative for hematuria.      Objective:     BP 104/60   Pulse 75   Temp 98.2 F (36.8 C) (Oral)   Wt 110 lb 4.8 oz (50 kg)   SpO2 99%   BMI 18.93 kg/m  BP Readings from Last 3 Encounters:  11/03/24 104/60  08/27/24 124/70  08/11/24 122/70   Wt Readings from Last 3 Encounters:  11/03/24 110 lb 4.8 oz (50 kg)  08/27/24 114 lb 14.4 oz (52.1 kg)  08/11/24 116 lb 14.4 oz (53 kg)      Physical Exam Vitals reviewed.  Constitutional:       General: She is not in acute distress. Cardiovascular:     Rate and Rhythm: Normal rate and regular rhythm.  Pulmonary:     Effort: Pulmonary effort is normal.     Breath sounds: Normal breath sounds.  Abdominal:     General: Abdomen is flat. Bowel sounds are normal. There is no distension.     Tenderness: There is no abdominal tenderness. There is no guarding or rebound.  Neurological:     Mental Status: She is alert.      No results found for any visits on 11/03/24.  Last CBC Lab Results  Component Value Date   WBC 6.2 06/10/2024   HGB 13.8 06/10/2024   HCT 41.3 06/10/2024   MCV 88.0 06/10/2024   MCH 29.8 04/04/2024   RDW 14.4 06/10/2024   PLT 276.0 06/10/2024   Last metabolic panel Lab Results  Component Value Date   GLUCOSE 87 06/10/2024   NA 138 06/10/2024   K 5.2 No hemolysis seen (H) 06/10/2024   CL 101 06/10/2024   CO2 27 06/10/2024   BUN 19 06/10/2024   CREATININE 0.93 06/10/2024   GFR 58.78 (L) 06/10/2024   CALCIUM 9.9 06/10/2024   PHOS 2.1 (L) 12/11/2022   PROT 6.9 06/10/2024   ALBUMIN 4.6 06/10/2024   BILITOT 0.6 06/10/2024   ALKPHOS 42 06/10/2024   AST 21 06/10/2024   ALT 10 06/10/2024   ANIONGAP 10 04/04/2024   Last hemoglobin A1c No results found for: HGBA1C Last thyroid  functions Lab Results  Component Value Date   TSH 1.13 06/10/2024      The 10-year ASCVD risk score (Arnett DK, et al., 2019) is: 17.8%    Assessment & Plan:   #1 unintended weight loss.  Etiology unclear.  Recent labs reassuring.  She had recent normal sed rate.  CT abdomen pelvis in May unremarkable.  Chest x-ray in June unremarkable.  We did discuss possibly getting some additional labs and perhaps even reimaging but decided first to consider trial of Remeron  15 mg nightly to see if this will stimulate her appetite and help her to gain some weight.  We also discussed importance of being very intentional with her eating.  Set up 1 month follow-up.  If not gaining  any at that point consider additional imaging and labs  #2 memory loss/cognitive impairment.  Recent MMSE 28/30.  Patient on low-dose Aricept  5 mg daily.  We elected not to bump this up further at this time with her recent mild nausea but not clear this is related to the Aricept .  Reassess at follow-up in a month.  She has pending follow-up with neurology.  Wolm Scarlet, MD     [1]  Allergies Allergen Reactions   Codeine Nausea Only   Hydrocodone  Nausea And Vomiting and Other (See Comments)  get dizzy and spaced out Other reaction(s): Other (See Comments) get dizzy and spaced out   Hydrocodone -Acetaminophen      Other Reaction(s): GI Intolerance   "

## 2024-12-16 ENCOUNTER — Ambulatory Visit: Admitting: Family Medicine

## 2025-01-05 ENCOUNTER — Ambulatory Visit: Admitting: Neurology

## 2025-01-16 ENCOUNTER — Ambulatory Visit: Admitting: Family Medicine
